# Patient Record
Sex: Female | Born: 1960
Health system: Southern US, Community
[De-identification: ages and names within clinical notes are randomized; demographics above are authoritative.]

## PROBLEM LIST (undated history)

## (undated) DIAGNOSIS — I428 Other cardiomyopathies: Secondary | ICD-10-CM

## (undated) DIAGNOSIS — E7800 Pure hypercholesterolemia, unspecified: Secondary | ICD-10-CM

## (undated) DIAGNOSIS — I5022 Chronic systolic (congestive) heart failure: Secondary | ICD-10-CM

## (undated) DIAGNOSIS — E119 Type 2 diabetes mellitus without complications: Secondary | ICD-10-CM

## (undated) DIAGNOSIS — D509 Iron deficiency anemia, unspecified: Secondary | ICD-10-CM

## (undated) DIAGNOSIS — J45909 Unspecified asthma, uncomplicated: Secondary | ICD-10-CM

## (undated) DIAGNOSIS — I10 Essential (primary) hypertension: Secondary | ICD-10-CM

## (undated) HISTORY — PX: TUBAL LIGATION: SHX77

## (undated) HISTORY — PX: WISDOM TOOTH EXTRACTION: SHX21

## (undated) HISTORY — PX: COLONOSCOPY: SHX174

---

## 1998-12-11 ENCOUNTER — Emergency Department (HOSPITAL_COMMUNITY): Admission: EM | Admit: 1998-12-11 | Discharge: 1998-12-11 | Payer: Self-pay | Admitting: Internal Medicine

## 2000-04-26 ENCOUNTER — Emergency Department (HOSPITAL_COMMUNITY): Admission: EM | Admit: 2000-04-26 | Discharge: 2000-04-26 | Payer: Self-pay | Admitting: Emergency Medicine

## 2000-04-26 ENCOUNTER — Encounter: Payer: Self-pay | Admitting: Emergency Medicine

## 2003-12-20 HISTORY — PX: CORONARY ANGIOPLASTY: SHX604

## 2004-03-07 ENCOUNTER — Emergency Department (HOSPITAL_COMMUNITY): Admission: AD | Admit: 2004-03-07 | Discharge: 2004-03-07 | Payer: Self-pay | Admitting: Family Medicine

## 2004-12-03 ENCOUNTER — Inpatient Hospital Stay (HOSPITAL_COMMUNITY): Admission: EM | Admit: 2004-12-03 | Discharge: 2004-12-07 | Payer: Self-pay | Admitting: Emergency Medicine

## 2004-12-03 ENCOUNTER — Encounter: Payer: Self-pay | Admitting: Cardiology

## 2005-05-05 ENCOUNTER — Encounter: Admission: RE | Admit: 2005-05-05 | Discharge: 2005-05-05 | Payer: Self-pay | Admitting: Internal Medicine

## 2006-09-28 ENCOUNTER — Encounter: Admission: RE | Admit: 2006-09-28 | Discharge: 2006-09-28 | Payer: Self-pay | Admitting: Internal Medicine

## 2007-10-31 ENCOUNTER — Encounter: Admission: RE | Admit: 2007-10-31 | Discharge: 2007-10-31 | Payer: Self-pay | Admitting: Internal Medicine

## 2008-05-01 ENCOUNTER — Emergency Department (HOSPITAL_COMMUNITY): Admission: EM | Admit: 2008-05-01 | Discharge: 2008-05-01 | Payer: Self-pay | Admitting: Emergency Medicine

## 2008-05-28 ENCOUNTER — Ambulatory Visit (HOSPITAL_COMMUNITY): Admission: RE | Admit: 2008-05-28 | Discharge: 2008-05-28 | Payer: Self-pay | Admitting: Cardiology

## 2008-05-28 ENCOUNTER — Encounter (INDEPENDENT_AMBULATORY_CARE_PROVIDER_SITE_OTHER): Payer: Self-pay | Admitting: Cardiology

## 2008-11-25 ENCOUNTER — Encounter: Admission: RE | Admit: 2008-11-25 | Discharge: 2008-11-25 | Payer: Self-pay | Admitting: Internal Medicine

## 2009-10-08 ENCOUNTER — Emergency Department (HOSPITAL_COMMUNITY): Admission: EM | Admit: 2009-10-08 | Discharge: 2009-10-08 | Payer: Self-pay | Admitting: Family Medicine

## 2011-05-06 NOTE — Cardiovascular Report (Signed)
[name], UCCELLO             ACCOUNT NO.:  [id number]   MEDICAL RECORD NO.:  [id number]          PATIENT TYPE:  INP   LOCATION:                               FACILITY:  [facility]   PHYSICIAN:  Jeanella Craze. Little, M.D. DATE OF BIRTH:  1961-02-05   DATE OF PROCEDURE:  12/06/2004  DATE OF DISCHARGE:                              CARDIAC CATHETERIZATION   INDICATIONS FOR TEST:  This 50 year old female has a family history of  cardiomyopathy and coronary artery disease.  She presents with a three to  four-week history of increasing PND and orthopnea and marked dyspnea on  exertion.  She was admitted on December 03, 2004 in mild congestive heart  failure.  She has cardiomegaly and a decreased ejection fraction.   She is brought to the catheterization laboratory for right and left heart  catheterization.   PROCEDURE:  Following informed consent the patient was prepped and draped in  the usual sterile fashion exposing the right groin.  Following local  anesthetic with 1% Xylocaine the Seldinger technique was employed and the 7-  [place] introducer sheath was placed into the right femoral vein, a 5-French  introducer sheath in the right femoral artery.  Left and right coronary  arteriography and ventriculography in the RAO projection and LAO projection  and a Swan-Ganz catheter was advanced through its normal route into the  pulmonary artery with hemodynamic monitoring undertaken throughout its  station.  Cardiac output by thermodilution was performed.  Oxygen  saturations in both the AO and pulmonary artery were performed.   TOTAL CONTRAST:  110 mL.   EQUIPMENT:  5-French Judkins diagnostic catheters, 7-French Swan-Ganz  catheter.   RESULTS:  HEMODYNAMIC MONITORING:  Right atrial pressure was 7.  Right ventricular was 46/6.  Pulmonary artery  pressure was 49/23.  Pulmonary artery wedge pressure 17.  Central aortic  pressure 140/95.  Left ventricular pressure 137/13.  The left  ventricular  end-diastolic pressure was 25.   Cardiac output by thermodilution 3.4 L/minute.  Cardiac index 2.1.  Cardiac  output by Fick 4.5 L/minute, cardiac index 2.7.   Oxygen saturation in the AO 92% on room air, in the pulmonary artery 66% on  room air.   VENTRICULOGRAPHY:  Ventriculography in both the RAO and LAO projection revealed mildly dilated  left ventricular cavity.  There was global hypokinesis.  Ejection fraction  calculated at 30%.  The left ventricular end-diastolic pressure was 25.  No  significant mitral regurgitation was seen on ventriculography.   CORONARY ARTERIOGRAPHY:  There was mild calcification seen on fluoroscopy in the distribution of the  mid LAD.  1.  Left main normal.  It bifurcated.  2.  LAD:  The LAD crossed the apex of the heart.  There were mild      irregularities in the proximal and mid segment all less than 30%.  There      were three diagonals that were relatively small and the distal LAD as it      crossed the apex was small.  3.  Circumflex:  Mild irregularities in the proximal circumflex were noted.  The distal circumflex as it bifurcated into OM #2 and OM #3 was      relatively small.  The ostium of the first marginal vessel was about 60%      narrowed.  OM #2 and OM #3 were small in diameter, but did not have      significant disease.  4.  Right coronary artery:  The right coronary artery has a proximal area of      30% narrowing and a mid area of 30% narrowing.  The PDA and the      posterolateral vessels were free of disease.   CONCLUSION:  1.  Pulmonary hypertension with pulmonary artery pressure of 49/23.  2.  Mild to moderate coronary disease, medical treatment only.  No high      grade stenoses.  3.  Left ventricular dilatation with global hypokinesis and a calculated      ejection fraction of 30%.  This is disproportionately low compared to      the mild coronary disease.  It appears to be an idiopathic       cardiomyopathy and will be best managed with medical therapy.   I have placed her on Lasix 40 mg b.i.d., lisinopril 10 mg daily (I stopped  the Altace because of the expense), increased her Coreg to 6.25 mg b.i.d.,  discontinued her Lovenox.  Will continue her on aspirin therapy.  She is  already on spironolactone.        ___________________________________________  Jeanella Craze Little, M.D.    ABL/MEDQ  D:  12/06/2004  T:  12/07/2004  Job:  [id number]   cc:   Nolene Ebbs, M.D.  [address]  [place]  [place] [address]  Fax: [phone]   Cath Lab

## 2011-05-06 NOTE — Discharge Summary (Signed)
NAMEFONDA, MONCRIEF             ACCOUNT NO.:  [id number]   MEDICAL RECORD NO.:  [id number]          PATIENT TYPE:  INP   LOCATION:  [id number]                         FACILITY:  [facility]   PHYSICIAN:  Nolene Ebbs, M.D.    DATE OF BIRTH:  19-Dec-1961   DATE OF ADMISSION:  12/02/2004  DATE OF DISCHARGE:  12/07/2004                                 DISCHARGE SUMMARY   PRESENTATION:  [name] is a 50 year old African American lady  who was admitted via the emergency room at [facility] where she  presented with one week of cough with frothy sputum, some chest discomfort  along with the cough. She had no leg swelling, diaphoresis, nausea, or  palpitations. She did have some orthopnea and paroxysmal nocturnal dyspnea.  In the emergency room she was noted to be in mild respiratory distress. She  had bibasilar crackles on her chest exam.   LABORATORY DATA:  She had an elevated brain natriuretic peptide. She was  therefore started on Aricept in case of new onset congestive heart failure  which is likely due to uncontrolled hypertension, chronic.   HOSPITAL COURSE:  On admission the patient was started on diuretics,  potassium replacement as her potassium was 3.3. She was monitored with I&Os,  daily weights. An echocardiogram was requested. Serial enzymes and EKG were  done to rule out acute coronary syndrome. The echocardiogram showed mild LV  dilatation, left decreased ventricular ejection fraction of 20% to 30%, and  marked left ventricular hypertrophy.  Cardiology consultation was requested  for possible cardiac catheterization to rule out ischemic cardiomyopathy.  The patient was seen by Dr. Tami Ribas at the [facility]. A left and right heart catheterization was performed on December 06, 2004, which did show global LV hypokinesia with mild dilatation, ejection  fraction was calculated at 30%. There was mild to moderate coronary artery  disease. I was  recommended for medical therapy and also pulmonary  hypertension at 49/33.  The patient was therefore started on lisinopril 10  mg once a day, Lasix changed to 40 mg b.i.d., Coreg started as 6.25 mg p.o.  b.i.d. The patient's condition improved during the course of hospitalization  and she had no __________, no chest pain, and the cough resolved. On  December 07, 2004, she was in good condition, vital signs were stable, chest  was clear to auscultation, and there was no peripheral edema. Potassium was  3.9, hemoglobin 13.2.  She was therefore thought to be stable for discharge  home.   DISCHARGE DIAGNOSES:  1.  Congestive heart failure due to idiopathic cardiomyopathy, ejection      fraction 30%, but hypertension was well controlled.  2.  Coronary artery disease, mild to moderate; for medical management.   The patient was discharged in stable condition.   DISPOSITION:  Home.   DISCHARGE MEDICATIONS:  1.  Lasix 40 mg one p.o. b.i.d.  2.  Coreg 6.25 mg one p.o. b.i.d.  3.  Lisinopril 10 mg one p.o. daily.  4.  Enteric-coated aspirin 325 mg p.o. daily.  5.  Digoxin 0.125 mg p.o.  daily.  6.  Inspra 25 mg p.o. daily.  7.  Potassium chloride 20 mEq p.o. daily.   FOLLOWUP:  Dr. Gwenlyn Found in one week and Dr. Tami Ribas in two weeks.      EA/MEDQ  D:  02/01/2005  T:  02/01/2005  Job:  [id number]

## 2013-03-08 ENCOUNTER — Encounter (HOSPITAL_COMMUNITY): Payer: Self-pay | Admitting: Emergency Medicine

## 2013-03-08 ENCOUNTER — Emergency Department (HOSPITAL_COMMUNITY)
Admission: EM | Admit: 2013-03-08 | Discharge: 2013-03-08 | Disposition: A | Discharge status: Home / Self Care | Payer: Medicaid Other | Attending: Emergency Medicine | Admitting: Emergency Medicine

## 2013-03-08 ENCOUNTER — Emergency Department (HOSPITAL_COMMUNITY): Payer: Medicaid Other

## 2013-03-08 DIAGNOSIS — R509 Fever, unspecified: Secondary | ICD-10-CM | POA: Insufficient documentation

## 2013-03-08 DIAGNOSIS — R05 Cough: Secondary | ICD-10-CM | POA: Insufficient documentation

## 2013-03-08 DIAGNOSIS — R000 Tachycardia, unspecified: Secondary | ICD-10-CM | POA: Insufficient documentation

## 2013-03-08 DIAGNOSIS — J3489 Other specified disorders of nose and nasal sinuses: Secondary | ICD-10-CM | POA: Insufficient documentation

## 2013-03-08 DIAGNOSIS — R059 Cough, unspecified: Secondary | ICD-10-CM | POA: Insufficient documentation

## 2013-03-08 DIAGNOSIS — J40 Bronchitis, not specified as acute or chronic: Secondary | ICD-10-CM

## 2013-03-08 DIAGNOSIS — R0602 Shortness of breath: Secondary | ICD-10-CM | POA: Insufficient documentation

## 2013-03-08 DIAGNOSIS — I10 Essential (primary) hypertension: Secondary | ICD-10-CM | POA: Insufficient documentation

## 2013-03-08 HISTORY — DX: Essential (primary) hypertension: I10

## 2013-03-08 HISTORY — DX: Pure hypercholesterolemia, unspecified: E78.00

## 2013-03-08 HISTORY — DX: Unspecified asthma, uncomplicated: J45.909

## 2013-03-08 LAB — URINALYSIS, ROUTINE W REFLEX MICROSCOPIC
Bilirubin Urine: NEGATIVE
Glucose, UA: NEGATIVE mg/dL
Hgb urine dipstick: NEGATIVE
Nitrite: NEGATIVE
Specific Gravity, Urine: 1.02 (ref 1.005–1.030)
pH: 5 (ref 5.0–8.0)

## 2013-03-08 LAB — CBC WITH DIFFERENTIAL/PLATELET
Basophils Absolute: 0.1 10*3/uL (ref 0.0–0.1)
Basophils Relative: 1 % (ref 0–1)
Eosinophils Absolute: 0.1 10*3/uL (ref 0.0–0.7)
MCH: 25.7 pg — ABNORMAL LOW (ref 26.0–34.0)
MCHC: 33.7 g/dL (ref 30.0–36.0)
Neutro Abs: 5.6 10*3/uL (ref 1.7–7.7)
Neutrophils Relative %: 73 % (ref 43–77)
Platelets: 427 10*3/uL — ABNORMAL HIGH (ref 150–400)

## 2013-03-08 LAB — COMPREHENSIVE METABOLIC PANEL
ALT: 9 U/L (ref 0–35)
AST: 13 U/L (ref 0–37)
Albumin: 3.3 g/dL — ABNORMAL LOW (ref 3.5–5.2)
Alkaline Phosphatase: 91 U/L (ref 39–117)
Potassium: 3.6 mEq/L (ref 3.5–5.1)
Sodium: 140 mEq/L (ref 135–145)
Total Protein: 7.2 g/dL (ref 6.0–8.3)

## 2013-03-08 LAB — URINE MICROSCOPIC-ADD ON

## 2013-03-08 LAB — POCT I-STAT TROPONIN I

## 2013-03-08 LAB — D-DIMER, QUANTITATIVE: D-Dimer, Quant: 0.35 ug/mL-FEU (ref 0.00–0.48)

## 2013-03-08 MED ORDER — ALBUTEROL SULFATE (5 MG/ML) 0.5% IN NEBU
5.0000 mg | INHALATION_SOLUTION | Freq: Once | RESPIRATORY_TRACT | Status: AC
  Administered 2013-03-08: 5 mg via RESPIRATORY_TRACT
  Filled 2013-03-08: qty 1

## 2013-03-08 MED ORDER — ALBUTEROL SULFATE HFA 108 (90 BASE) MCG/ACT IN AERS
2.0000 | INHALATION_SPRAY | RESPIRATORY_TRACT | Status: DC | PRN
  Administered 2013-03-08: 2 via RESPIRATORY_TRACT
  Filled 2013-03-08: qty 6.7

## 2013-03-08 MED ORDER — IPRATROPIUM BROMIDE 0.02 % IN SOLN
0.5000 mg | Freq: Once | RESPIRATORY_TRACT | Status: AC
  Administered 2013-03-08: 0.5 mg via RESPIRATORY_TRACT
  Filled 2013-03-08: qty 2.5

## 2013-03-08 MED ORDER — PREDNISONE 20 MG PO TABS: 40.0000 mg | ORAL_TABLET | Freq: Every day | ORAL | Status: DC

## 2013-03-08 MED ORDER — LISINOPRIL 20 MG PO TABS: 10.0000 mg | ORAL_TABLET | Freq: Every day | ORAL | Status: DC

## 2013-03-08 NOTE — ED Provider Notes (Addendum)
History     CSN: [id number]  Arrival date & time 03/08/13  [id number]   First MD Initiated Contact with Patient 03/08/13 0757      Chief Complaint  Patient presents with  . Chest Pain  . Shortness of Breath    (Consider location/radiation/quality/duration/timing/severity/associated sxs/prior treatment) Patient is a 52 y.o. female presenting with chest pain and shortness of breath. The history is provided by the patient.  Chest Pain Chest pain location: Entire chest. Pain quality: tightness   Pain radiates to:  Does not radiate Pain radiates to the back: no   Pain severity:  Moderate Onset quality:  Gradual Duration:  1 week Timing:  Constant Progression:  Worsening Chronicity:  New Context: breathing   Context comment:  Activity and coughing Relieved by:  Rest Worsened by:  Coughing, exertion and deep breathing (Lying down) Associated symptoms: cough, fever and shortness of breath   Associated symptoms: no abdominal pain, no back pain, no diaphoresis, no lower extremity edema, no near-syncope, not vomiting and no weakness   Associated symptoms comment:  For the last 3-4 days has had yellow and green mucous production with cough Risk factors: high cholesterol and hypertension   Risk factors: no birth control, no coronary artery disease, no prior DVT/PE, no smoking and no surgery   Shortness of Breath Associated symptoms: chest pain, cough and fever   Associated symptoms: no abdominal pain, no diaphoresis and no vomiting     No past medical history on file.  No past surgical history on file.  No family history on file.  History  Substance Use Topics  . Smoking status: Not on file  . Smokeless tobacco: Not on file  . Alcohol Use: Not on file    OB History   No data available      Review of Systems  Constitutional: Positive for fever and chills. Negative for diaphoresis.  HENT: Positive for congestion and rhinorrhea.   Respiratory: Positive for cough and shortness  of breath.   Cardiovascular: Positive for chest pain. Negative for near-syncope.  Gastrointestinal: Negative for vomiting and abdominal pain.  Musculoskeletal: Negative for back pain.  Neurological: Negative for weakness.  All other systems reviewed and are negative.    Allergies  Review of patient's allergies indicates no known allergies.  Home Medications   Current Outpatient Rx  Name  Route  Sig  Dispense  Refill  . acetaminophen (TYLENOL) 500 MG tablet   Oral   Take 1,000 mg by mouth every 6 (six) hours as needed for pain.         Marland Kitchen guaiFENesin (MUCINEX) 600 MG 12 hr tablet   Oral   Take 600 mg by mouth 2 (two) times daily as needed for congestion.         Marland Kitchen Phenylephrine-Pheniramine-DM (THERAFLU COLD & COUGH) 10-08-19 MG PACK   Oral   Take 2 tablets by mouth 2 (two) times daily as needed (for cold/flu symptoms).           There were no vitals taken for this visit.  Physical Exam  Nursing note and vitals reviewed. Constitutional: She is oriented to person, place, and time. She appears well-developed and well-nourished. No distress.  HENT:  Head: Normocephalic and atraumatic.  Mouth/Throat: Oropharynx is clear and moist.  Eyes: Conjunctivae and EOM are normal. Pupils are equal, round, and reactive to light.  Neck: Normal range of motion. Neck supple.  Cardiovascular: Regular rhythm and intact distal pulses.  Tachycardia present.   No murmur  heard. Pulmonary/Chest: Effort normal. No respiratory distress. She has decreased breath sounds in the right lower field and the left lower field. She has no wheezes. She has no rales.  Abdominal: Soft. She exhibits no distension. There is no tenderness. There is no rebound and no guarding.  Musculoskeletal: Normal range of motion. She exhibits no edema and no tenderness.  Neurological: She is alert and oriented to person, place, and time.  Skin: Skin is warm and dry. No rash noted. No erythema.  Psychiatric: She has a  normal mood and affect. Her behavior is normal.    ED Course  Procedures (including critical care time)  Labs Reviewed  CBC WITH DIFFERENTIAL - Abnormal; Notable for the following:    RBC 3.78 (*)    Hemoglobin 9.7 (*)    HCT 28.8 (*)    MCV 76.2 (*)    MCH 25.7 (*)    RDW 16.5 (*)    Platelets 427 (*)    All other components within normal limits  COMPREHENSIVE METABOLIC PANEL - Abnormal; Notable for the following:    Glucose, Bld 112 (*)    Albumin 3.3 (*)    All other components within normal limits  URINALYSIS, ROUTINE W REFLEX MICROSCOPIC - Abnormal; Notable for the following:    Color, Urine AMBER (*)    APPearance CLOUDY (*)    Protein, ur 30 (*)    All other components within normal limits  URINE MICROSCOPIC-ADD ON - Abnormal; Notable for the following:    Squamous Epithelial / LPF MANY (*)    Bacteria, UA FEW (*)    All other components within normal limits  D-DIMER, QUANTITATIVE  POCT I-STAT TROPONIN I   Dg Chest 2 View  03/08/2013  *RADIOLOGY REPORT*  Clinical Data: Cough and shortness of breath.  CHEST - 2 VIEW  Comparison: 12/03/2004.  Findings: The heart is borderline in size but stable.  The mediastinal and hilar contours are normal and unchanged. Bronchitic changes may suggest bronchitis.  No focal infiltrates, edema or effusions.  The bony thorax is intact.  IMPRESSION: Stable borderline heart size. Mild vascular congestion. Bronchitic lung changes may suggest bronchitis.   Original Report Authenticated By: Marijo Sanes, M.D.      Date: 03/08/2013  Rate: 104  Rhythm: sinus tachycardia  QRS Axis: normal  Intervals: normal  ST/T Wave abnormalities: nonspecific T wave changes  Conduction Disutrbances:none  Narrative Interpretation:   Old EKG Reviewed: none available   1. Bronchitis   2. Hypertension       MDM   Patient presenting with 2 weeks of cough, congestion and worsening shortness of breath. Patient is currently mildly tachycardic but  otherwise has normal vital signs.  She currently states she still feels mildly short of breath and having chest tightness. She does have a history of asthma but does not have an inhaler. Also she states she has a history of CHF, hypertension and hyperlipidemia and has been off medications for over a year and she's not seeing a regular Dr. Patient is well-appearing on exam and in no acute distress. Lungs are without wheezing but are diminished in the bases. Otherwise patient has no overt signs of heart failure.  She also has been complaining for the last month with intermittent leg pain currently is having no leg pain.  CBC, CMP, d-dimer, UA, chest x-ray pending. EKG shows sinus tachycardia in the low 100s with some nonspecific T-wave inversion anteriorly.  Patient given albuterol and Atrovent to see if she  has any symptomatic improvement.   10:52 AM Labs within normal limits. Chest x-ray showed bronchitis. D-dimer is negative. On repeat evaluation patient is feeling much better after albuterol and Atrovent. Will discharge home with an inhaler and steroids. Patient was also given a refill of her blood pressure medication and given information to get a new physician     Blanchie Dessert, MD 03/08/13 [place], MD 03/08/13 1054

## 2013-03-08 NOTE — ED Notes (Signed)
Discharge instructions reviewed. Pt verbalized understanding.  

## 2013-03-08 NOTE — ED Notes (Signed)
Patient states started out with a cough x 2 weeks ago.  Patient states she has been having chest pain associated with cough x 2 weeks.   Patient complains of shortness of breath when coughing.  Productive cough with phlegm that is yellow in color.

## 2013-03-18 ENCOUNTER — Emergency Department (HOSPITAL_COMMUNITY): Payer: Medicaid Other

## 2013-03-18 ENCOUNTER — Encounter (HOSPITAL_COMMUNITY): Payer: Self-pay | Admitting: *Deleted

## 2013-03-18 ENCOUNTER — Emergency Department (HOSPITAL_COMMUNITY)
Admission: EM | Admit: 2013-03-18 | Discharge: 2013-03-18 | Disposition: A | Discharge status: Home / Self Care | Payer: Medicaid Other | Attending: Emergency Medicine | Admitting: Emergency Medicine

## 2013-03-18 DIAGNOSIS — Z79899 Other long term (current) drug therapy: Secondary | ICD-10-CM | POA: Insufficient documentation

## 2013-03-18 DIAGNOSIS — I509 Heart failure, unspecified: Secondary | ICD-10-CM | POA: Insufficient documentation

## 2013-03-18 DIAGNOSIS — E7800 Pure hypercholesterolemia, unspecified: Secondary | ICD-10-CM | POA: Insufficient documentation

## 2013-03-18 DIAGNOSIS — J189 Pneumonia, unspecified organism: Secondary | ICD-10-CM

## 2013-03-18 DIAGNOSIS — J45909 Unspecified asthma, uncomplicated: Secondary | ICD-10-CM | POA: Insufficient documentation

## 2013-03-18 DIAGNOSIS — D649 Anemia, unspecified: Secondary | ICD-10-CM | POA: Insufficient documentation

## 2013-03-18 DIAGNOSIS — I10 Essential (primary) hypertension: Secondary | ICD-10-CM | POA: Insufficient documentation

## 2013-03-18 LAB — CBC
HCT: 25.7 % — ABNORMAL LOW (ref 36.0–46.0)
MCHC: 33.1 g/dL (ref 30.0–36.0)
MCV: 76 fL — ABNORMAL LOW (ref 78.0–100.0)
RDW: 17.1 % — ABNORMAL HIGH (ref 11.5–15.5)

## 2013-03-18 LAB — BASIC METABOLIC PANEL
BUN: 11 mg/dL (ref 6–23)
Creatinine, Ser: 0.89 mg/dL (ref 0.50–1.10)
GFR calc Af Amer: 84 mL/min — ABNORMAL LOW (ref >90)
GFR calc non Af Amer: 73 mL/min — ABNORMAL LOW (ref >90)
Potassium: 3.6 mEq/L (ref 3.5–5.1)

## 2013-03-18 LAB — POCT I-STAT TROPONIN I: Troponin i, poc: 0.01 ng/mL (ref 0.00–0.08)

## 2013-03-18 MED ORDER — AZITHROMYCIN 250 MG PO TABS: 250.0000 mg | ORAL_TABLET | Freq: Every day | ORAL | Status: DC

## 2013-03-18 MED ORDER — IPRATROPIUM BROMIDE 0.02 % IN SOLN
0.5000 mg | Freq: Once | RESPIRATORY_TRACT | Status: AC
  Administered 2013-03-18: 0.5 mg via RESPIRATORY_TRACT
  Filled 2013-03-18: qty 2.5

## 2013-03-18 MED ORDER — ALBUTEROL SULFATE (5 MG/ML) 0.5% IN NEBU
5.0000 mg | INHALATION_SOLUTION | Freq: Once | RESPIRATORY_TRACT | Status: AC
  Administered 2013-03-18: 5 mg via RESPIRATORY_TRACT
  Filled 2013-03-18: qty 1

## 2013-03-18 MED ORDER — DEXTROSE 5 % IV SOLN
1.0000 g | Freq: Once | INTRAVENOUS | Status: AC
  Administered 2013-03-18: 1 g via INTRAVENOUS
  Filled 2013-03-18: qty 10

## 2013-03-18 MED ORDER — IOHEXOL 350 MG/ML SOLN
100.0000 mL | Freq: Once | INTRAVENOUS | Status: AC | PRN
  Administered 2013-03-18: 100 mL via INTRAVENOUS

## 2013-03-18 NOTE — ED Notes (Signed)
Pt c/o productive cough, SOB, N/V, HA, fever, chills and generalized body aches.

## 2013-03-18 NOTE — ED Provider Notes (Signed)
History     CSN: [id number]  Arrival date & time 03/18/13  0704   First MD Initiated Contact with Patient 03/18/13 [phone]      Chief Complaint  Patient presents with  . URI    (Consider location/radiation/quality/duration/timing/severity/associated sxs/prior treatment) HPI Comments: Patient presents today with a chief complaint of productive cough, SOB, fever, chills, body aches, and chest tightness.  She reports that her symptoms have been present for the past 10 days.  Tightness in her chest has been constant over the past four days.  She denies chest pain, except for with coughing and taking a deep breath.  She had a fever yesterday, but cannot remember what her temperature was.  She was seen in the ED for the same one week ago and was diagnosed with Bronchitis.  She was discharged home with an Albuterol inhaler and given a 5 day course of Prednisone.  She reports that she completed the Prednisone.  She reports that her Albuterol inhaler does help with her symptoms.  She has a history of HTN, CHF, and Asthma.  She currently does not smoke and has never smoked.  She has never been hospitalized for Asthma.  She denies any prolonged travel or surgeries in the past 4 weeks.  No lower extremity edema, erythema, or pain.  No history of DVT or PE.  No use of estrogen containing medications.  Patient states that she has a history of CHF that was diagnosed in 2005.  Review of the chart shows that she had an Echo done in 2009, which showed a EF of 55%.  Patient is a 52 y.o. female presenting with URI. The history is provided by the patient.  URI Presenting symptoms: congestion, cough, fever and rhinorrhea   Associated symptoms: headaches and myalgias   Associated symptoms: no wheezing     Past Medical History  Diagnosis Date  . Hypertension   . Hypercholesterolemia   . CHF (congestive heart failure)   . Asthma     No past surgical history on file.  No family history on file.  History   Substance Use Topics  . Smoking status: Never Smoker   . Smokeless tobacco: Not on file  . Alcohol Use: No    OB History   Grav Para Term Preterm Abortions TAB SAB Ect Mult Living                  Review of Systems  Constitutional: Positive for fever and chills.  HENT: Positive for congestion and rhinorrhea.   Respiratory: Positive for cough, chest tightness and shortness of breath. Negative for wheezing.        Denies orthopnea or PND  Cardiovascular: Negative for leg swelling.  Gastrointestinal: Positive for nausea. Negative for vomiting, abdominal pain, blood in stool, abdominal distention and anal bleeding.       No melena or hematochezia  Musculoskeletal: Positive for myalgias.  Neurological: Positive for headaches. Negative for dizziness, syncope, light-headedness and numbness.  All other systems reviewed and are negative.    Allergies  Review of patient's allergies indicates no known allergies.  Home Medications   Current Outpatient Rx  Name  Route  Sig  Dispense  Refill  . acetaminophen (TYLENOL) 500 MG tablet   Oral   Take 1,000 mg by mouth every 6 (six) hours as needed for pain.         Marland Kitchen guaiFENesin (MUCINEX) 600 MG 12 hr tablet   Oral   Take 600 mg by mouth  2 (two) times daily as needed for congestion.         Marland Kitchen lisinopril (PRINIVIL,ZESTRIL) 20 MG tablet   Oral   Take 10 mg by mouth daily.         Marland Kitchen Phenylephrine-Pheniramine-DM (THERAFLU COLD & COUGH) 10-08-19 MG PACK   Oral   Take 2 tablets by mouth 2 (two) times daily as needed (for cold/flu symptoms).         . predniSONE (DELTASONE) 20 MG tablet   Oral   Take 2 tablets (40 mg total) by mouth daily.   10 tablet   0     BP 193/99  Pulse 112  Temp(Src) 98.8 F (37.1 C) (Oral)  Resp 20  SpO2 100%  LMP 03/05/2013  Physical Exam  Nursing note and vitals reviewed. Constitutional: She appears well-developed and well-nourished. No distress.  HENT:  Head: Normocephalic and  atraumatic.  Right Ear: Tympanic membrane and ear canal normal.  Left Ear: Tympanic membrane and ear canal normal.  Nose: Nose normal.  Mouth/Throat: Uvula is midline, oropharynx is clear and moist and mucous membranes are normal.  Neck: Normal range of motion. Neck supple.  Cardiovascular: Regular rhythm, normal heart sounds and intact distal pulses.  Tachycardia present.   Pulmonary/Chest: Effort normal and breath sounds normal. No respiratory distress. She has no wheezes. She has no rales.  Abdominal: Soft. There is no tenderness.  Musculoskeletal: Normal range of motion.  Trace pitting edema bilaterally No LE erythema Negative Homan's sign bilaterally  Neurological: She is alert.  Skin: Skin is warm and dry. She is not diaphoretic.  Psychiatric: She has a normal mood and affect.    ED Course  Procedures (including critical care time)  Labs Reviewed  CBC  BASIC METABOLIC PANEL  PRO B NATRIURETIC PEPTIDE  D-DIMER, QUANTITATIVE   Dg Chest 2 View  03/18/2013  *RADIOLOGY REPORT*  Clinical Data: Cough, shortness of breath and fever.  CHEST - 2 VIEW  Comparison: 03/08/2013  Findings: Bilateral bronchial thickening is slightly more prominent.  No focal consolidation, edema or pleural fluid is identified.  Heart size is stable.  IMPRESSION: Slightly more prominent bilateral bronchial thickening, likely reflecting bronchitis.   Original Report Authenticated By: Aletta Edouard, M.D.    Ct Angio Chest Pe W/cm &/or Wo Cm  03/18/2013  *RADIOLOGY REPORT*  Clinical Data: Productive cough.  Chest pain.  Shortness of breath.  CT ANGIOGRAPHY CHEST  Technique:  Multidetector CT imaging of the chest using the standard protocol during bolus administration of intravenous contrast. Multiplanar reconstructed images including MIPs were obtained and reviewed to evaluate the vascular anatomy.  Contrast: 124mL OMNIPAQUE IOHEXOL 350 MG/ML SOLN  Comparison: 03/18/2013  Findings: No filling defect is identified  in the pulmonary arterial tree to suggest pulmonary embolus.  Thoracic adenopathy identified.  Right upper paratracheal node short axis 0.9 cm, image 12 of series 4.  Lower right paratracheal node 1.3 cm, image 22 of series 4.  AP window node short axis 0.7 cm, image 27 of series 4.  Right hilar node 1.2 cm, image 34 of series 4.  Prominence of the infrahilar nodal tissue noted bilaterally.  Subcarinal node 1.5 cm on image 36 of series 4.  The nodes are relatively indistinctly marginated, with a suggestion of a low-level inflammatory stranding in the mediastinum.  Cardiomegaly noted with left ventricular predominance.  No reversal of the interventricular septal contour.  Small right pleural effusion noted.  Interstitial accentuation noted along secondary pulmonary lobular septate at the  lung apices.  Patchy ground-glass opacities are present in both lungs with the greatest degree of confluence in the right upper lobe where there is some overt airspace opacity. Airway thickening is present bilaterally.  IMPRESSION:  1.  Mild thoracic adenopathy with interstitial accentuation, scattered ground-glass opacities, considerable airway thickening, and cardiomegaly.  Configuration of the ground-glass opacities favors atypical pneumonia, with acute pulmonary edema or sarcoidosis less likely differential diagnostic considerations given the constellation of findings. 2.  No embolus observed.   Original Report Authenticated By: Van Clines, M.D.      No diagnosis found.  Reassessed patient.  She reports that she is feeling much better at this time.  MDM  Patient presenting with productive cough, chest tightness, and SOB.  No respiratory distress.  CXR showing bronchitis.  Patient found to be tachycardic initially.  D-dimer ordered and was found to be mildly elevated.    CT angio chest therefore ordered to rule out PE.  CTA negative for PE, but did show a possible Atypical Pneumonia.  Patient did have a fever  yesterday.  Patient given Ceftriaxone IV and discharged home with Azithromycin to treat for CAP.  Return precautions given.        Sherlyn Lees Bronson, PA-C 03/18/13 [phone]

## 2013-03-18 NOTE — ED Notes (Signed)
Pt alert and mentating appropriately upon d/c teaching; pt given d.c teaching and prescriptions; pt verbalizes understanding of d/c teaching; pt has no further questions upon d/c teaching; NAD noted upon d/c; pt given resource guide and education on usage.

## 2013-03-18 NOTE — ED Notes (Signed)
Per CT, patient's IV blew in CT.  They called IV team and they are getting another line at present.

## 2013-03-19 NOTE — ED Provider Notes (Signed)
Medical screening examination/treatment/procedure(s) were performed by non-physician practitioner and as supervising physician I was immediately available for consultation/collaboration.  Orlie Dakin, MD 03/19/13 [phone]

## 2013-04-08 ENCOUNTER — Emergency Department (HOSPITAL_COMMUNITY): Payer: Medicaid Other

## 2013-04-08 ENCOUNTER — Encounter: Payer: Self-pay | Admitting: Emergency Medicine

## 2013-04-08 ENCOUNTER — Emergency Department (HOSPITAL_COMMUNITY)
Admission: EM | Admit: 2013-04-08 | Discharge: 2013-04-08 | Disposition: A | Discharge status: Home / Self Care | Payer: Medicaid Other | Attending: Emergency Medicine | Admitting: Emergency Medicine

## 2013-04-08 ENCOUNTER — Encounter (HOSPITAL_COMMUNITY): Payer: Self-pay | Admitting: *Deleted

## 2013-04-08 DIAGNOSIS — I509 Heart failure, unspecified: Secondary | ICD-10-CM | POA: Insufficient documentation

## 2013-04-08 DIAGNOSIS — R0602 Shortness of breath: Secondary | ICD-10-CM | POA: Insufficient documentation

## 2013-04-08 DIAGNOSIS — R112 Nausea with vomiting, unspecified: Secondary | ICD-10-CM | POA: Insufficient documentation

## 2013-04-08 DIAGNOSIS — R109 Unspecified abdominal pain: Secondary | ICD-10-CM | POA: Insufficient documentation

## 2013-04-08 DIAGNOSIS — Z79899 Other long term (current) drug therapy: Secondary | ICD-10-CM | POA: Insufficient documentation

## 2013-04-08 DIAGNOSIS — I10 Essential (primary) hypertension: Secondary | ICD-10-CM | POA: Insufficient documentation

## 2013-04-08 DIAGNOSIS — J45909 Unspecified asthma, uncomplicated: Secondary | ICD-10-CM | POA: Insufficient documentation

## 2013-04-08 DIAGNOSIS — E876 Hypokalemia: Secondary | ICD-10-CM | POA: Insufficient documentation

## 2013-04-08 DIAGNOSIS — Z8639 Personal history of other endocrine, nutritional and metabolic disease: Secondary | ICD-10-CM | POA: Insufficient documentation

## 2013-04-08 DIAGNOSIS — Z8701 Personal history of pneumonia (recurrent): Secondary | ICD-10-CM | POA: Insufficient documentation

## 2013-04-08 DIAGNOSIS — Z862 Personal history of diseases of the blood and blood-forming organs and certain disorders involving the immune mechanism: Secondary | ICD-10-CM | POA: Insufficient documentation

## 2013-04-08 LAB — BASIC METABOLIC PANEL
CO2: 24 mEq/L (ref 19–32)
Calcium: 9.1 mg/dL (ref 8.4–10.5)
Chloride: 103 mEq/L (ref 96–112)
Glucose, Bld: 99 mg/dL (ref 70–99)
Potassium: 3 mEq/L — ABNORMAL LOW (ref 3.5–5.1)
Sodium: 138 mEq/L (ref 135–145)

## 2013-04-08 LAB — POCT I-STAT TROPONIN I: Troponin i, poc: 0.02 ng/mL (ref 0.00–0.08)

## 2013-04-08 LAB — CBC
HCT: 29.6 % — ABNORMAL LOW (ref 36.0–46.0)
Hemoglobin: 9.5 g/dL — ABNORMAL LOW (ref 12.0–15.0)
RBC: 3.94 MIL/uL (ref 3.87–5.11)
WBC: 5.9 10*3/uL (ref 4.0–10.5)

## 2013-04-08 LAB — PRO B NATRIURETIC PEPTIDE: Pro B Natriuretic peptide (BNP): 4745 pg/mL — ABNORMAL HIGH (ref 0–125)

## 2013-04-08 MED ORDER — LISINOPRIL 10 MG PO TABS: 10.0000 mg | ORAL_TABLET | Freq: Every day | ORAL | Status: DC

## 2013-04-08 MED ORDER — FUROSEMIDE 20 MG PO TABS: 20.0000 mg | ORAL_TABLET | Freq: Two times a day (BID) | ORAL | Status: DC

## 2013-04-08 MED ORDER — POTASSIUM CHLORIDE ER 10 MEQ PO TBCR: 20.0000 meq | EXTENDED_RELEASE_TABLET | Freq: Two times a day (BID) | ORAL | Status: DC

## 2013-04-08 MED ORDER — ALBUTEROL SULFATE HFA 108 (90 BASE) MCG/ACT IN AERS: 2.0000 | INHALATION_SPRAY | RESPIRATORY_TRACT | Status: DC | PRN

## 2013-04-08 MED ORDER — POTASSIUM CHLORIDE CRYS ER 20 MEQ PO TBCR
40.0000 meq | EXTENDED_RELEASE_TABLET | Freq: Once | ORAL | Status: AC
  Administered 2013-04-08: 40 meq via ORAL
  Filled 2013-04-08: qty 2

## 2013-04-08 MED ORDER — PREDNISONE 20 MG PO TABS: ORAL_TABLET | ORAL | Status: DC

## 2013-04-08 NOTE — ED Notes (Signed)
Pt with hx of chf to ED c/o diffuse abd pain, sob esp on laying flat and chest tightness.  States emesis this am.  States has been tx here for bronchitis/asthma several weeks ago with no relief.  No new edema noted.  States does not have a cardiologist.

## 2013-04-08 NOTE — ED Notes (Signed)
Medications and discharge instructions reviewed. Pt verbalized understanding.

## 2013-04-08 NOTE — ED Provider Notes (Signed)
History     CSN: [id number]  Arrival date & time 04/08/13  1638   First MD Initiated Contact with Patient 04/08/13 2040      Chief Complaint  Patient presents with  . Chest Pain  . Shortness of Breath  . Abdominal Pain    (Consider location/radiation/quality/duration/timing/severity/associated sxs/prior treatment) HPI Comments: This is a 52 year old female who presents today with shortness of breath and chest tightness. She has a history of congestive heart failure and hypertension. She does not take any home medications. She states she lost her blood pressure pills. She was on Lasix years ago. Currently she states that her shortness of breath has been worsening for the past month. She was seen in the ED about 3 weeks ago and treated with azithromycin for her an atypical pneumonia which she states that she took the full course. She reports no improvement since that time. She states she is here today because of her orthopnea. She cannot sleep because she cannot lay down or else her shortness of breath will worsen. She had an episode of nausea and vomiting this morning. She states those symptoms have now resolved. She denies chest pain, fevers, chills, weakness, numbness. The history is provided by the patient. No language interpreter was used.    Past Medical History  Diagnosis Date  . Hypertension   . Hypercholesterolemia   . CHF (congestive heart failure)   . Asthma     History reviewed. No pertinent past surgical history.  No family history on file.  History  Substance Use Topics  . Smoking status: Never Smoker   . Smokeless tobacco: Not on file  . Alcohol Use: No    OB History   Grav Para Term Preterm Abortions TAB SAB Ect Mult Living                  Review of Systems  Constitutional: Negative for fever and chills.  Respiratory: Positive for chest tightness and shortness of breath.   Cardiovascular: Negative for chest pain.  Gastrointestinal: Positive for nausea  and vomiting. Negative for diarrhea and constipation.  Neurological: Negative for weakness and numbness.  All other systems reviewed and are negative.    Allergies  Review of patient's allergies indicates no known allergies.  Home Medications   Current Outpatient Rx  Name  Route  Sig  Dispense  Refill  . acetaminophen (TYLENOL) 500 MG tablet   Oral   Take 1,000 mg by mouth every 6 (six) hours as needed for pain.         Marland Kitchen azithromycin (ZITHROMAX) 250 MG tablet   Oral   Take 1 tablet (250 mg total) by mouth daily. Take first 2 tablets together, then 1 every day until finished.   6 tablet   0   . guaiFENesin (MUCINEX) 600 MG 12 hr tablet   Oral   Take 600 mg by mouth 2 (two) times daily as needed for congestion.         Marland Kitchen lisinopril (PRINIVIL,ZESTRIL) 20 MG tablet   Oral   Take 10 mg by mouth daily.         Marland Kitchen Phenylephrine-Pheniramine-DM (THERAFLU COLD & COUGH) 10-08-19 MG PACK   Oral   Take 2 tablets by mouth 2 (two) times daily as needed (for cold/flu symptoms).         . predniSONE (DELTASONE) 20 MG tablet   Oral   Take 2 tablets (40 mg total) by mouth daily.   10 tablet  0     BP 151/102  Pulse 96  Temp(Src) 98.2 F (36.8 C) (Oral)  Resp 16  SpO2 99%  LMP 03/31/2013  Physical Exam  Nursing note and vitals reviewed. Constitutional: She is oriented to person, place, and time. She appears well-developed and well-nourished. She appears ill. No distress.  HENT:  Head: Normocephalic and atraumatic.  Right Ear: External ear normal.  Left Ear: External ear normal.  Nose: Nose normal.  Mouth/Throat: Oropharynx is clear and moist.  Eyes: Conjunctivae are normal.  Neck: Normal range of motion.  Cardiovascular: Normal rate, regular rhythm and normal heart sounds.   Pulmonary/Chest: Effort normal and breath sounds normal. No stridor. Not tachypneic. No respiratory distress. She has no wheezes. She has no rales.  No tachypnea, no increased work of  breathing Speaks in full sentences  Abdominal: Soft. She exhibits no distension.  Musculoskeletal: Normal range of motion.  Neurological: She is alert and oriented to person, place, and time. She has normal strength.  Skin: Skin is warm and dry. She is not diaphoretic. No erythema.  Psychiatric: She has a normal mood and affect. Her behavior is normal.    ED Course  Procedures (including critical care time)  Labs Reviewed  CBC - Abnormal; Notable for the following:    Hemoglobin 9.5 (*)    HCT 29.6 (*)    MCV 75.1 (*)    MCH 24.1 (*)    RDW 16.9 (*)    Platelets 469 (*)    All other components within normal limits  PRO B NATRIURETIC PEPTIDE - Abnormal; Notable for the following:    Pro B Natriuretic peptide (BNP) 4745.0 (*)    All other components within normal limits  BASIC METABOLIC PANEL - Abnormal; Notable for the following:    Potassium 3.0 (*)    GFR calc non Af Amer 69 (*)    GFR calc Af Amer 80 (*)    All other components within normal limits  POCT I-STAT TROPONIN I   Dg Chest 2 View  04/08/2013  *RADIOLOGY REPORT*  Clinical Data: Chest pain.  Shortness of breath.  Asthma.  CHEST - 2 VIEW  Comparison: 03/18/2013  Findings: Mild cardiomegaly stable.  Bilateral perihilar interstitial prominence is again demonstrated.  There is mild patchy airspace disease in the right lung which shows mild worsening in the right upper lobe.  A tiny right pleural effusion is also seen which is likely increased.  IMPRESSION:  1.  Mild bilateral interstitial and patchy airspace disease, showing mild worsening in the right upper lobe. 2.  Increased size of tiny right pleural effusion. 3.  Stable cardiomegaly.   Original Report Authenticated By: Earle Gell, M.D.     Date: 04/09/2013  Rate: 95  Rhythm: normal sinus rhythm  QRS Axis: normal  Intervals: normal  ST/T Wave abnormalities: normal  Conduction Disutrbances:none  Narrative Interpretation:   Old EKG Reviewed: unchanged    1.  Congestive heart failure   2. Hypokalemia       MDM  Patient presents today with hypokalemia and an exacerbation of CHF. She is noncompliant with her home medications including Lasix and lisinopril. BNP 4745 today. Potassium given in ED. Prescription given for Lasix, potassium, lisinopril, prednisone, albuterol. Discussed that she must followup with her PCP. She was given a resource guide to help establish care. Discussed case with Dr. Betsey Holiday and he agrees with plan. Strict return precautions given. Vital signs stable for discharge.Patient / Family / Caregiver informed of clinical course,  understand medical decision-making process, and agree with plan.         Elwyn Lade, PA-C 04/09/13 1035

## 2013-04-08 NOTE — ED Notes (Signed)
Hannah PA at bedside

## 2013-04-12 NOTE — ED Provider Notes (Signed)
Medical screening examination/treatment/procedure(s) were performed by non-physician practitioner and as supervising physician I was immediately available for consultation/collaboration.    Orpah Greek, MD 04/12/13 1450

## 2013-04-17 ENCOUNTER — Encounter (HOSPITAL_COMMUNITY): Payer: Self-pay | Admitting: *Deleted

## 2013-04-17 ENCOUNTER — Inpatient Hospital Stay (HOSPITAL_COMMUNITY): Payer: Medicaid Other

## 2013-04-17 ENCOUNTER — Emergency Department (HOSPITAL_COMMUNITY): Payer: Medicaid Other

## 2013-04-17 ENCOUNTER — Inpatient Hospital Stay (HOSPITAL_COMMUNITY)
Admission: EM | Admit: 2013-04-17 | Discharge: 2013-04-19 | DRG: 292 | Disposition: A | Discharge status: Home / Self Care | Payer: Medicaid Other | Attending: Cardiovascular Disease | Admitting: Cardiovascular Disease

## 2013-04-17 DIAGNOSIS — IMO0002 Reserved for concepts with insufficient information to code with codable children: Secondary | ICD-10-CM

## 2013-04-17 DIAGNOSIS — I5023 Acute on chronic systolic (congestive) heart failure: Secondary | ICD-10-CM

## 2013-04-17 DIAGNOSIS — I2510 Atherosclerotic heart disease of native coronary artery without angina pectoris: Secondary | ICD-10-CM | POA: Diagnosis present

## 2013-04-17 DIAGNOSIS — J45909 Unspecified asthma, uncomplicated: Secondary | ICD-10-CM | POA: Diagnosis present

## 2013-04-17 DIAGNOSIS — E876 Hypokalemia: Secondary | ICD-10-CM | POA: Diagnosis present

## 2013-04-17 DIAGNOSIS — I509 Heart failure, unspecified: Secondary | ICD-10-CM | POA: Diagnosis present

## 2013-04-17 DIAGNOSIS — E7800 Pure hypercholesterolemia, unspecified: Secondary | ICD-10-CM | POA: Diagnosis present

## 2013-04-17 DIAGNOSIS — D509 Iron deficiency anemia, unspecified: Secondary | ICD-10-CM | POA: Diagnosis present

## 2013-04-17 DIAGNOSIS — I214 Non-ST elevation (NSTEMI) myocardial infarction: Secondary | ICD-10-CM

## 2013-04-17 DIAGNOSIS — Z7982 Long term (current) use of aspirin: Secondary | ICD-10-CM

## 2013-04-17 DIAGNOSIS — I428 Other cardiomyopathies: Secondary | ICD-10-CM | POA: Diagnosis present

## 2013-04-17 DIAGNOSIS — R748 Abnormal levels of other serum enzymes: Secondary | ICD-10-CM | POA: Diagnosis present

## 2013-04-17 DIAGNOSIS — R778 Other specified abnormalities of plasma proteins: Secondary | ICD-10-CM

## 2013-04-17 DIAGNOSIS — I10 Essential (primary) hypertension: Secondary | ICD-10-CM | POA: Diagnosis present

## 2013-04-17 HISTORY — DX: Iron deficiency anemia, unspecified: D50.9

## 2013-04-17 HISTORY — DX: Chronic systolic (congestive) heart failure: I50.22

## 2013-04-17 HISTORY — DX: Other cardiomyopathies: I42.8

## 2013-04-17 LAB — CBC WITH DIFFERENTIAL/PLATELET
Basophils Relative: 1 % (ref 0–1)
Eosinophils Absolute: 0 10*3/uL (ref 0.0–0.7)
Eosinophils Relative: 0 % (ref 0–5)
Lymphs Abs: 2 10*3/uL (ref 0.7–4.0)
MCH: 23.6 pg — ABNORMAL LOW (ref 26.0–34.0)
MCHC: 31.9 g/dL (ref 30.0–36.0)
MCV: 74 fL — ABNORMAL LOW (ref 78.0–100.0)
Neutrophils Relative %: 79 % — ABNORMAL HIGH (ref 43–77)
Platelets: 582 10*3/uL — ABNORMAL HIGH (ref 150–400)
RBC: 4.07 MIL/uL (ref 3.87–5.11)

## 2013-04-17 LAB — LIPID PANEL
Cholesterol: 204 mg/dL — ABNORMAL HIGH (ref 0–200)
HDL: 37 mg/dL — ABNORMAL LOW (ref >39)
Triglycerides: 114 mg/dL (ref <150)

## 2013-04-17 LAB — HEMOGLOBIN A1C: Mean Plasma Glucose: 128 mg/dL — ABNORMAL HIGH (ref <117)

## 2013-04-17 LAB — COMPREHENSIVE METABOLIC PANEL
ALT: 38 U/L — ABNORMAL HIGH (ref 0–35)
Albumin: 3.3 g/dL — ABNORMAL LOW (ref 3.5–5.2)
BUN: 17 mg/dL (ref 6–23)
Calcium: 9 mg/dL (ref 8.4–10.5)
GFR calc Af Amer: 78 mL/min — ABNORMAL LOW (ref >90)
Glucose, Bld: 104 mg/dL — ABNORMAL HIGH (ref 70–99)
Sodium: 139 mEq/L (ref 135–145)
Total Protein: 7 g/dL (ref 6.0–8.3)

## 2013-04-17 LAB — TROPONIN I
Troponin I: 0.82 ng/mL (ref <0.30)
Troponin I: 0.9 ng/mL (ref <0.30)

## 2013-04-17 LAB — MRSA PCR SCREENING: MRSA by PCR: NEGATIVE

## 2013-04-17 LAB — TSH: TSH: 2.54 u[IU]/mL (ref 0.350–4.500)

## 2013-04-17 LAB — POCT I-STAT TROPONIN I: Troponin i, poc: 0.25 ng/mL (ref 0.00–0.08)

## 2013-04-17 LAB — LIPASE, BLOOD: Lipase: 20 U/L (ref 11–59)

## 2013-04-17 MED ORDER — ATORVASTATIN CALCIUM 20 MG PO TABS
20.0000 mg | ORAL_TABLET | Freq: Every day | ORAL | Status: DC
  Administered 2013-04-17 – 2013-04-18 (×2): 20 mg via ORAL
  Filled 2013-04-17 (×3): qty 1

## 2013-04-17 MED ORDER — HEPARIN BOLUS VIA INFUSION
4000.0000 [IU] | Freq: Once | INTRAVENOUS | Status: AC
  Administered 2013-04-17: 4000 [IU] via INTRAVENOUS

## 2013-04-17 MED ORDER — SIMVASTATIN 40 MG PO TABS: 40.0000 mg | ORAL_TABLET | Freq: Every day | ORAL | Status: DC

## 2013-04-17 MED ORDER — HEPARIN (PORCINE) IN NACL 100-0.45 UNIT/ML-% IJ SOLN
900.0000 [IU]/h | INTRAMUSCULAR | Status: DC
  Administered 2013-04-17 (×2): 900 [IU]/h via INTRAVENOUS
  Filled 2013-04-17 (×2): qty 250

## 2013-04-17 MED ORDER — FUROSEMIDE 10 MG/ML IJ SOLN
40.0000 mg | Freq: Once | INTRAMUSCULAR | Status: AC
  Administered 2013-04-17: 40 mg via INTRAVENOUS
  Filled 2013-04-17: qty 4

## 2013-04-17 MED ORDER — NITROGLYCERIN 0.4 MG SL SUBL: 0.4000 mg | SUBLINGUAL_TABLET | SUBLINGUAL | Status: DC | PRN

## 2013-04-17 MED ORDER — FERROUS SULFATE 325 (65 FE) MG PO TABS
325.0000 mg | ORAL_TABLET | Freq: Every day | ORAL | Status: DC
  Administered 2013-04-18 – 2013-04-19 (×2): 325 mg via ORAL
  Filled 2013-04-17 (×3): qty 1

## 2013-04-17 MED ORDER — ONDANSETRON HCL 4 MG/2ML IJ SOLN: 4.0000 mg | Freq: Four times a day (QID) | INTRAMUSCULAR | Status: DC | PRN

## 2013-04-17 MED ORDER — ASPIRIN 81 MG PO CHEW
324.0000 mg | CHEWABLE_TABLET | Freq: Once | ORAL | Status: AC
  Administered 2013-04-17: 324 mg via ORAL
  Filled 2013-04-17: qty 4

## 2013-04-17 MED ORDER — ALBUTEROL SULFATE HFA 108 (90 BASE) MCG/ACT IN AERS: 2.0000 | INHALATION_SPRAY | RESPIRATORY_TRACT | Status: DC | PRN

## 2013-04-17 MED ORDER — SODIUM CHLORIDE 0.9 % IV SOLN
INTRAVENOUS | Status: DC
  Administered 2013-04-17: 10 mL/h via INTRAVENOUS

## 2013-04-17 MED ORDER — ACETAMINOPHEN 325 MG PO TABS: 650.0000 mg | ORAL_TABLET | ORAL | Status: DC | PRN

## 2013-04-17 MED ORDER — ASPIRIN EC 81 MG PO TBEC
81.0000 mg | DELAYED_RELEASE_TABLET | Freq: Every day | ORAL | Status: DC
  Administered 2013-04-18 – 2013-04-19 (×2): 81 mg via ORAL
  Filled 2013-04-17 (×2): qty 1

## 2013-04-17 MED ORDER — AMLODIPINE BESYLATE 5 MG PO TABS
5.0000 mg | ORAL_TABLET | Freq: Every day | ORAL | Status: DC
  Administered 2013-04-17: 5 mg via ORAL
  Filled 2013-04-17 (×2): qty 1

## 2013-04-17 MED ORDER — LISINOPRIL 10 MG PO TABS
10.0000 mg | ORAL_TABLET | Freq: Every day | ORAL | Status: DC
Start: 2013-04-17 — End: 2013-04-19
  Administered 2013-04-17 – 2013-04-19 (×3): 10 mg via ORAL
  Filled 2013-04-17 (×3): qty 1

## 2013-04-17 NOTE — ED Notes (Signed)
Patient transported to X-ray 

## 2013-04-17 NOTE — ED Notes (Signed)
Cardiologist is aware of chest pain.

## 2013-04-17 NOTE — ED Notes (Signed)
i stat troponin results give to Dr. Sharol Given by Leonel Ramsay, EMT

## 2013-04-17 NOTE — ED Provider Notes (Signed)
History     CSN: [id number]  Arrival date & time 04/17/13  0047   First MD Initiated Contact with Patient 04/17/13 0117      Chief Complaint  Patient presents with  . multiple symptoms     (Consider location/radiation/quality/duration/timing/severity/associated sxs/prior treatment) HPI 52 yo female presents to the ER with several complaints.  She reports worsening shortness of breath, orthopnea, DOE, PND over the last 4-5 days.  She has had increased cough.  She developed mid back pain, worse with movement and palpation.  She denies any trauma, new activities or prior similar pain.  She reports diffuse abdominal pain, no fevers, no nausea or diarrhea.  She had one episode of post tussive emesis today.  She has had shooting pain into her legs bilaterally.  All symptoms have presented in last 4-5 days.  Pt was seen by her PCM, Dr Vista Lawman on Monday.  He started her on amlodipine.  Pt has h/o idiopathic cardiomyopathy, cad, pulm hypertension, asthma, and CHF.  She was told that she was anemic on her most recent visit, and started on otc iron as well.  She has had some chest pain with coughing.  Past Medical History  Diagnosis Date  . Hypertension   . Hypercholesterolemia   . CHF (congestive heart failure)   . Asthma     History reviewed. No pertinent past surgical history.  No family history on file.  History  Substance Use Topics  . Smoking status: Never Smoker   . Smokeless tobacco: Not on file  . Alcohol Use: No    OB History   Grav Para Term Preterm Abortions TAB SAB Ect Mult Living                  Review of Systems  All other systems reviewed and are negative.    Allergies  Review of patient's allergies indicates no known allergies.  Home Medications   Current Outpatient Rx  Name  Route  Sig  Dispense  Refill  . albuterol (PROVENTIL HFA;VENTOLIN HFA) 108 (90 BASE) MCG/ACT inhaler   Inhalation   Inhale 2 puffs into the lungs every 4 (four) hours as needed  for wheezing.   1 Inhaler   0   . amLODipine (NORVASC) 5 MG tablet   Oral   Take 5 mg by mouth daily.         . ferrous sulfate 325 (65 FE) MG tablet   Oral   Take 325 mg by mouth daily with breakfast.         . furosemide (LASIX) 20 MG tablet   Oral   Take 1 tablet (20 mg total) by mouth 2 (two) times daily.   30 tablet   0   . lisinopril (PRINIVIL,ZESTRIL) 10 MG tablet   Oral   Take 1 tablet (10 mg total) by mouth daily.   30 tablet   0   . potassium chloride (K-DUR) 10 MEQ tablet   Oral   Take 2 tablets (20 mEq total) by mouth 2 (two) times daily.   30 tablet   0     BP 153/92  Pulse 93  Temp(Src) 98.7 F (37.1 C) (Oral)  Resp 20  SpO2 100%  LMP 03/31/2013  Physical Exam  Nursing note and vitals reviewed. Constitutional: She is oriented to person, place, and time. She appears well-developed and well-nourished. No distress.  HENT:  Head: Normocephalic and atraumatic.  Nose: Nose normal.  Mouth/Throat: Oropharynx is clear and moist.  Eyes:  Conjunctivae and EOM are normal. Pupils are equal, round, and reactive to light.  Neck: Normal range of motion. Neck supple. No JVD present. No tracheal deviation present. No thyromegaly present.  Cardiovascular: Normal rate, regular rhythm, normal heart sounds and intact distal pulses.  Exam reveals no gallop and no friction rub.   No murmur heard. Pulmonary/Chest: Effort normal. No stridor. No respiratory distress. She has no wheezes. She has rales. She exhibits no tenderness.  tachypnea  Abdominal: Soft. Bowel sounds are normal. She exhibits no distension and no mass. There is tenderness (mild diffuse abd pain). There is no rebound and no guarding.  Musculoskeletal: Normal range of motion. She exhibits edema. She exhibits no tenderness.  Lymphadenopathy:    She has no cervical adenopathy.  Neurological: She is alert and oriented to person, place, and time. She exhibits normal muscle tone. Coordination normal.  Skin:  Skin is warm and dry. No rash noted. No erythema. No pallor.  Psychiatric: She has a normal mood and affect. Her behavior is normal. Judgment and thought content normal.    ED Course  Procedures (including critical care time)  CRITICAL CARE Performed by: Kalman Drape   Total critical care time: 30 min  Critical care time was exclusive of separately billable procedures and treating other patients.  Critical care was necessary to treat or prevent imminent or life-threatening deterioration.  Critical care was time spent personally by me on the following activities: development of treatment plan with patient and/or surrogate as well as nursing, discussions with consultants, evaluation of patient's response to treatment, examination of patient, obtaining history from patient or surrogate, ordering and performing treatments and interventions, ordering and review of laboratory studies, ordering and review of radiographic studies, pulse oximetry and re-evaluation of patient's condition.   Labs Reviewed  CBC WITH DIFFERENTIAL - Abnormal; Notable for the following:    WBC 12.1 (*)    Hemoglobin 9.6 (*)    HCT 30.1 (*)    MCV 74.0 (*)    MCH 23.6 (*)    RDW 17.3 (*)    Platelets 582 (*)    Neutrophils Relative 79 (*)    Neutro Abs 9.6 (*)    All other components within normal limits  COMPREHENSIVE METABOLIC PANEL - Abnormal; Notable for the following:    Glucose, Bld 104 (*)    Albumin 3.3 (*)    ALT 38 (*)    GFR calc non Af Amer 67 (*)    GFR calc Af Amer 78 (*)    All other components within normal limits  PRO B NATRIURETIC PEPTIDE - Abnormal; Notable for the following:    Pro B Natriuretic peptide (BNP) 7121.0 (*)    All other components within normal limits  POCT I-STAT TROPONIN I - Abnormal; Notable for the following:    Troponin i, poc 0.25 (*)    All other components within normal limits  LIPASE, BLOOD  TROPONIN I   Dg Chest 2 View  04/17/2013  *RADIOLOGY REPORT*   Clinical Data: Fever, cough, chest pain, shortness of breath, weakness, abdominal pain, and vomiting for 3 days.  CHEST - 2 VIEW  Comparison: 04/08/2013  Findings: Shallow inspiration.  Cardiac enlargement.  Mild pulmonary vascular congestion.  This is increasing since the previous study.  Small right pleural effusion with mild infiltration or atelectasis in the right lung base.  No pneumothorax.  IMPRESSION: Mild developing pulmonary vascular congestion with small right pleural effusion and infiltration or atelectasis in the right lung base.  Original Report Authenticated By: Lucienne Capers, M.D.     Date: 04/17/2013  Rate: 93  Rhythm: normal sinus rhythm  QRS Axis: normal  Intervals: normal  ST/T Wave abnormalities: normal  Conduction Disutrbances:none  Narrative Interpretation:   Old EKG Reviewed: unchanged    1. CHF exacerbation   2. Elevated troponin       MDM  51 yo female with multiple complaints.  On exam she appears to have worsening CHf with rales, tachypnea.  POC troponin elevated, no ST elevation on EKG.  BNP up from prior levels.  Will give iv lasix, recheck troponin.  Expect patient will need admission for diuresis and possible repeat echo.        Kalman Drape, MD 04/17/13 [phone]

## 2013-04-17 NOTE — Care Management Note (Signed)
    Page 1 of 1   04/17/2013     10:09:54 AM   CARE MANAGEMENT NOTE 04/17/2013  Patient:  LISAANNE, SOLIS   Account Number:  [id number]  Date Initiated:  04/17/2013  Documentation initiated by:  Elissa Hefty  Subjective/Objective Assessment:   adm w pos troponins and fld overload     Action/Plan:   lives alone   Anticipated DC Date:     Anticipated DC Plan:        DC Planning Services  CM consult      Choice offered to / List presented to:             Status of service:   Medicare Important Message given?   (If response is "NO", the following Medicare IM given date fields will be blank) Date Medicare IM given:   Date Additional Medicare IM given:    Discharge Disposition:    Per UR Regulation:  Reviewed for med. necessity/level of care/duration of stay  If discussed at [place] of Stay Meetings, dates discussed:    Comments:

## 2013-04-17 NOTE — ED Notes (Signed)
The pt returned from xray.  Iv placed lab just drawing labs unable to get blood from the iv site.  Family with the pt

## 2013-04-17 NOTE — Progress Notes (Signed)
ANTICOAGULATION CONSULT NOTE - Initial Consult  Pharmacy Consult for heparin Indication: chest pain/ACS  No Known Allergies  Patient Measurements: Height: 5\' 1"  (154.9 cm) Weight: 164 lb 14.5 oz (74.8 kg) IBW/kg (Calculated) : 47.8 Heparin Dosing Weight: 65kg  Vital Signs: Temp: 97.4 F (36.3 C) (04/30 0240) Temp src: Oral (04/30 0054) BP: 154/92 mmHg (04/30 0240) Pulse Rate: 95 (04/30 0240)  Labs:  Recent Labs  04/17/13 0225 04/17/13 0340  HGB 9.6*  --   HCT 30.1*  --   PLT 582*  --   CREATININE 0.95  --   TROPONINI  --  0.66*    Estimated Creatinine Clearance: 63.4 ml/min (by C-G formula based on Cr of 0.95).   Medical History: Past Medical History  Diagnosis Date  . Hypertension   . Hypercholesterolemia   . CHF (congestive heart failure)   . Asthma     Assessment: 52yo female c/o SOB and abdominal/back pain that worsened today, initial troponin mildly elevated, to begin heparin.  Goal of Therapy:  Heparin level 0.3-0.7 units/ml Monitor platelets by anticoagulation protocol: Yes   Plan:  Will give heparin 4000 units IV bolus x1 followed by gtt at 900 units/hr and monitor heparin levels and CBC.  Wynona Neat, PharmD, BCPS  04/17/2013,4:41 AM

## 2013-04-17 NOTE — ED Notes (Signed)
To x-ray

## 2013-04-17 NOTE — ED Notes (Signed)
The pt is here with some sob abd pain back pain for a long time worse today coughing earlier and she  Vomited from the sputum.  Alert hyperventilating skin warm and dry

## 2013-04-17 NOTE — Progress Notes (Signed)
ANTICOAGULATION CONSULT NOTE - Follow Up Consult  Pharmacy Consult for heparin Indication: chest pain/ACS  No Known Allergies  Patient Measurements: Height: 5\' 1"  (154.9 cm) Weight: 164 lb 14.5 oz (74.8 kg) IBW/kg (Calculated) : 47.8 Heparin Dosing Weight: 65kg  Vital Signs: Temp: 97.6 F (36.4 C) (04/30 1215) Temp src: Oral (04/30 1215) BP: 153/90 mmHg (04/30 1215) Pulse Rate: 99 (04/30 1215)  Labs:  Recent Labs  04/17/13 0225 04/17/13 0340 04/17/13 1122  HGB 9.6*  --   --   HCT 30.1*  --   --   PLT 582*  --   --   HEPARINUNFRC  --   --  0.44  CREATININE 0.95  --   --   TROPONINI  --  0.66*  --     Estimated Creatinine Clearance: 63.4 ml/min (by C-G formula based on Cr of 0.95).  Assessment: 52yo female c/o SOB and abdominal/back pain that worsened today, initial troponin mildly elevated. Initial heparin level at goal (0.4) no bleeding issues noted.  Goal of Therapy:  Heparin level 0.3-0.7 units/ml Monitor platelets by anticoagulation protocol: Yes   Plan: Continue heparin at  900 units/hr Recheck anti-Xa level to confirm in 6 hours then daily  Continue to monitor H&H and platelets  Erin Hearing PharmD., BCPS Clinical Pharmacist Pager [phone] 04/17/2013 1:14 PM

## 2013-04-17 NOTE — H&P (Signed)
Cardiology History and Physical  No PCP Per Patient  History of Present Illness (and review of medical records): [facility] is a 52 y.o. female who presents for evaluation of shortness of breath and chest pain.  She has known hx of idiopathic cardiomyopathy diagnosed back in 2005 where she underwent cardiac cath revealed non-obstructive disease with EF 30% and global hypokinesis.  She has started on appriopriate meds and had improvement in EF to 55% in 2009.  She has since been lost to follow up and now taking any medications.  She has been seen recently here in the ED in March and April prior to this presentation for simimlar symptoms.  She reports return of shortness of breath, dyspnea on exertion, PND, orthopnea, weakness and fatigue.  She has also had intermittent mid sternal chest tightness/pressure which can get to 8/10.  This pain can occur at rest, but at times worse with exertion.  She had mild improvement after recently being started back on a few medications including Lasix and CCB.  She however had acute worsening overnight of her symptoms and presented to ED for further evaluation.    Previous diagnostic testing for coronary artery disease includes: cardiac catheterization and echocardiogram. Previous history of cardiac disease includes Cardiomyopathy CHF Coronary Artery Disease. Coronary artery disease risk factors include: dyslipidemia, hypertension and sedentary lifestyle. Patient denies history of coronary angioplasty, coronary artery stent and previous M.I..  Review of Systems No fevers, chills, no wheezing.  Did have some vomiting. Further review of systems was otherwise negative other than stated in HPI.  There are no active problems to display for this patient.  Past Medical History  Diagnosis Date  . Hypertension   . Hypercholesterolemia   . CHF (congestive heart failure)   . Asthma     History reviewed. No pertinent past surgical history.   (Not in a hospital  admission) No Known Allergies  History  Substance Use Topics  . Smoking status: Never Smoker   . Smokeless tobacco: Not on file  . Alcohol Use: No    No family history on file.   Objective: Patient Vitals for the past 8 hrs:  BP Temp Temp src Pulse Resp SpO2 Height Weight  04/17/13 0446 152/87 mmHg - - 91 14 100 % - -  04/17/13 0409 - - - - - - 5\' 1"  (1.549 m) 74.8 kg (164 lb 14.5 oz)  04/17/13 0240 154/92 mmHg 97.4 F (36.3 C) - 95 22 100 % - -  04/17/13 0122 153/92 mmHg - - 93 20 100 % - -  04/17/13 0054 158/95 mmHg 98.7 F (37.1 C) Oral 100 25 98 % - -   General Appearance:    Alert, cooperative, no distress,  Head:    Normocephalic, without obvious abnormality, atraumatic  Eyes:     Anicteric sclerae  Neck:   Supple, mild JVD  Lungs:     Clear to auscultation bilaterally, respirations unlabored  Heart:    Regular rate and rhythm, S1 and S2 normal, no murmur  Abdomen:     Soft, non-tender, normoactive bowel sounds  Extremities:   Extremities normal, atraumatic, 1+ BLE edema  Pulses:   2+ and symmetric all extremities  Skin:   no rashes or lesions  Neurologic:   No focal deficits. AAO x3   Results for orders placed during the hospital encounter of 04/17/13 (from the past 48 hour(s))  CBC WITH DIFFERENTIAL     Status: Abnormal   Collection Time  04/17/13  2:25 AM      Result Value Range   WBC 12.1 (*) 4.0 - 10.5 K/uL   RBC 4.07  3.87 - 5.11 MIL/uL   Hemoglobin 9.6 (*) 12.0 - 15.0 g/dL   HCT 30.1 (*) 36.0 - 46.0 %   MCV 74.0 (*) 78.0 - 100.0 fL   MCH 23.6 (*) 26.0 - 34.0 pg   MCHC 31.9  30.0 - 36.0 g/dL   RDW 17.3 (*) 11.5 - 15.5 %   Platelets 582 (*) 150 - 400 K/uL   Neutrophils Relative 79 (*) 43 - 77 %   Neutro Abs 9.6 (*) 1.7 - 7.7 K/uL   Lymphocytes Relative 16  12 - 46 %   Lymphs Abs 2.0  0.7 - 4.0 K/uL   Monocytes Relative 4  3 - 12 %   Monocytes Absolute 0.5  0.1 - 1.0 K/uL   Eosinophils Relative 0  0 - 5 %   Eosinophils Absolute 0.0  0.0 - 0.7 K/uL    Basophils Relative 1  0 - 1 %   Basophils Absolute 0.1  0.0 - 0.1 K/uL  COMPREHENSIVE METABOLIC PANEL     Status: Abnormal   Collection Time    04/17/13  2:25 AM      Result Value Range   Sodium 139  135 - 145 mEq/L   Potassium 4.1  3.5 - 5.1 mEq/L   Chloride 105  96 - 112 mEq/L   CO2 21  19 - 32 mEq/L   Glucose, Bld 104 (*) 70 - 99 mg/dL   BUN 17  6 - 23 mg/dL   Creatinine, Ser 0.95  0.50 - 1.10 mg/dL   Calcium 9.0  8.4 - 10.5 mg/dL   Total Protein 7.0  6.0 - 8.3 g/dL   Albumin 3.3 (*) 3.5 - 5.2 g/dL   AST 35  0 - 37 U/L   ALT 38 (*) 0 - 35 U/L   Alkaline Phosphatase 115  39 - 117 U/L   Total Bilirubin 0.9  0.3 - 1.2 mg/dL   GFR calc non Af Amer 67 (*) >90 mL/min   GFR calc Af Amer 78 (*) >90 mL/min   Comment:            The eGFR has been calculated     using the CKD EPI equation.     This calculation has not been     validated in all clinical     situations.     eGFR's persistently     <90 mL/min signify     possible Chronic Kidney Disease.  LIPASE, BLOOD     Status: None   Collection Time    04/17/13  2:25 AM      Result Value Range   Lipase 20  11 - 59 U/L  PRO B NATRIURETIC PEPTIDE     Status: Abnormal   Collection Time    04/17/13  2:26 AM      Result Value Range   Pro B Natriuretic peptide (BNP) 7121.0 (*) 0 - 125 pg/mL  POCT I-STAT TROPONIN I     Status: Abnormal   Collection Time    04/17/13  2:57 AM      Result Value Range   Troponin i, poc 0.25 (*) 0.00 - 0.08 ng/mL   Comment NOTIFIED PHYSICIAN     Comment 3            Comment: Due to the release kinetics of cTnI,  a negative result within the first hours     of the onset of symptoms does not rule out     myocardial infarction with certainty.     If myocardial infarction is still suspected,     repeat the test at appropriate intervals.  TROPONIN I     Status: Abnormal   Collection Time    04/17/13  3:40 AM      Result Value Range   Troponin I 0.66 (*) <0.30 ng/mL   Comment:            Due to  the release kinetics of cTnI,     a negative result within the first hours     of the onset of symptoms does not rule out     myocardial infarction with certainty.     If myocardial infarction is still suspected,     repeat the test at appropriate intervals.     CRITICAL RESULT CALLED TO, READ BACK BY AND VERIFIED WITH:     D.FRIEDBURGER RN [phone] 04/17/13 E.GADDY   Dg Chest 2 View  04/17/2013  *RADIOLOGY REPORT*  Clinical Data: Fever, cough, chest pain, shortness of breath, weakness, abdominal pain, and vomiting for 3 days.  CHEST - 2 VIEW  Comparison: 04/08/2013  Findings: Shallow inspiration.  Cardiac enlargement.  Mild pulmonary vascular congestion.  This is increasing since the previous study.  Small right pleural effusion with mild infiltration or atelectasis in the right lung base.  No pneumothorax.  IMPRESSION: Mild developing pulmonary vascular congestion with small right pleural effusion and infiltration or atelectasis in the right lung base.   Original Report Authenticated By: Lucienne Capers, M.D.     ECG:  Sinus rhythm no acute ischemic changes  Echo: 2009 - Overall left ventricular systolic function was normal. Left ventricular ejection fraction was estimated to be 55 %. There were no left ventricular regional wall motion abnormalities. Left ventricular wall thickness was at the upper limits of normal. - There was very mild mitral annular calcification. Cath: 2005 VENTRICULOGRAPHY:  Ventriculography in both the RAO and LAO projection revealed mildly dilated  left ventricular cavity. There was global hypokinesis. Ejection fraction  calculated at 30%. The left ventricular end-diastolic pressure was 25. No  significant mitral regurgitation was seen on ventriculography.  CORONARY ARTERIOGRAPHY:  There was mild calcification seen on fluoroscopy in the distribution of the  mid LAD.  1. Left main normal. It bifurcated.  2. LAD: The LAD crossed the apex of the heart. There were  mild  irregularities in the proximal and mid segment all less than 30%. There  were three diagonals that were relatively small and the distal LAD as it  crossed the apex was small.  3. Circumflex: Mild irregularities in the proximal circumflex were noted.  The distal circumflex as it bifurcated into OM #2 and OM #3 was  relatively small. The ostium of the first marginal vessel was about 60%  narrowed. OM #2 and OM #3 were small in diameter, but did not have  significant disease.  4. Right coronary artery: The right coronary artery has a proximal area of  30% narrowing and a mid area of 30% narrowing. The PDA and the  posterolateral vessels were free of disease.   Assessment: 64 AAF with hx of idiopathic CMP presents with return of symptoms concerning for Acute on chronic systolic heart failure along with NSTEMI given progressively worsening chest pain and positive troponin.   Plan: 1. Cardiology Admission  2. Continuous  monitoring on Telemetry. 3. Repeat ekg on admit, prn chest pain or arrythmia 4. Trend cardiac biomarkers, check lipids, hgba1c, tsh 5. Medical management to include ASA,Heparin, Statin, NTG prn 6. Continue gentle diuresis with IV lasix, strict I/Os, daily weights, needs HF education. 7. TTE in am reassess LV function, wall motion abnormality. 8. May need further invasive ischemic evaluation to r/o obstructive disease.

## 2013-04-17 NOTE — ED Notes (Signed)
Report given to Angela

## 2013-04-17 NOTE — Progress Notes (Signed)
Patient ID: [facility], female   DOB: 05/10/1961, 52 y.o.   MRN: [id number]    Subjective:  Denies SSCP, palpitations or Dyspnea Previously seen by [facility] Dr Rex Kras  Objective:  Filed Vitals:   04/17/13 0446 04/17/13 0600 04/17/13 0700 04/17/13 0745  BP: 152/87 148/86 143/82 141/86  Pulse: 91 91 83 96  Temp:      TempSrc:      Resp: 14 26 26 16   Height:      Weight:      SpO2: 100% 100% 100% 100%    Intake/Output from previous day:  Intake/Output Summary (Last 24 hours) at 04/17/13 [id number] Last data filed at 04/17/13 0538  Gross per 24 hour  Intake      0 ml  Output   1300 ml  Net  -1300 ml    Physical Exam: Affect appropriate Healthy:  appears stated age HEENT: normal Neck supple with no adenopathy JVP normal no bruits no thyromegaly Lungs clear with no wheezing and good diaphragmatic motion Heart:  S1/S2 no murmur, no rub, gallop or click PMI normal Abdomen: benighn, BS positve, no tenderness, no AAA no bruit.  No HSM or HJR Distal pulses intact with no bruits No edema Neuro non-focal Skin warm and dry No muscular weakness   Lab Results: Basic Metabolic Panel:  Recent Labs  04/17/13 0225  NA 139  K 4.1  CL 105  CO2 21  GLUCOSE 104*  BUN 17  CREATININE 0.95  CALCIUM 9.0   Liver Function Tests:  Recent Labs  04/17/13 0225  AST 35  ALT 38*  ALKPHOS 115  BILITOT 0.9  PROT 7.0  ALBUMIN 3.3*    Recent Labs  04/17/13 0225  LIPASE 20   CBC:  Recent Labs  04/17/13 0225  WBC 12.1*  NEUTROABS 9.6*  HGB 9.6*  HCT 30.1*  MCV 74.0*  PLT 582*   Cardiac Enzymes:  Recent Labs  04/17/13 0340  TROPONINI 0.66*    Imaging: Dg Chest 2 View  04/17/2013  *RADIOLOGY REPORT*  Clinical Data: Fever, cough, chest pain, shortness of breath, weakness, abdominal pain, and vomiting for 3 days.  CHEST - 2 VIEW  Comparison: 04/08/2013  Findings: Shallow inspiration.  Cardiac enlargement.  Mild pulmonary vascular congestion.  This is increasing  since the previous study.  Small right pleural effusion with mild infiltration or atelectasis in the right lung base.  No pneumothorax.  IMPRESSION: Mild developing pulmonary vascular congestion with small right pleural effusion and infiltration or atelectasis in the right lung base.   Original Report Authenticated By: Lucienne Capers, M.D.     Cardiac Studies:  ECG: SR rate 88 LAD LVH   Telemetry: SR no arrhythmia 04/17/2013   Echo:   Medications:      . heparin 900 Units/hr (04/17/13 0534)    Assessment/Plan:  CHF:  With minimally elevated troponin History of non ischemic DCM.  Echo to assess EF  Good diuresis so far.  F/U CXR with lateral decubitus In am   HTN:  Rx with ACE Elevated Troponin:  No evidence of acute coronary syndrome no need for ETT unless echo abnormal  Jenkins Rouge 04/17/2013, 8:12 AM

## 2013-04-18 DIAGNOSIS — I369 Nonrheumatic tricuspid valve disorder, unspecified: Secondary | ICD-10-CM

## 2013-04-18 LAB — BASIC METABOLIC PANEL
CO2: 26 mEq/L (ref 19–32)
Chloride: 105 mEq/L (ref 96–112)
GFR calc Af Amer: 76 mL/min — ABNORMAL LOW (ref >90)
GFR calc non Af Amer: 66 mL/min — ABNORMAL LOW (ref >90)
GFR calc non Af Amer: 68 mL/min — ABNORMAL LOW (ref >90)
Glucose, Bld: 98 mg/dL (ref 70–99)
Potassium: 3 mEq/L — ABNORMAL LOW (ref 3.5–5.1)
Potassium: 3.1 mEq/L — ABNORMAL LOW (ref 3.5–5.1)
Sodium: 141 mEq/L (ref 135–145)
Sodium: 140 mEq/L (ref 135–145)

## 2013-04-18 LAB — PROTIME-INR
INR: 1.24 (ref 0.00–1.49)
Prothrombin Time: 15.4 seconds — ABNORMAL HIGH (ref 11.6–15.2)

## 2013-04-18 LAB — HEPARIN LEVEL (UNFRACTIONATED): Heparin Unfractionated: 0.29 IU/mL — ABNORMAL LOW (ref 0.30–0.70)

## 2013-04-18 LAB — CBC
Platelets: 543 10*3/uL — ABNORMAL HIGH (ref 150–400)
RDW: 17.5 % — ABNORMAL HIGH (ref 11.5–15.5)
WBC: 8.7 10*3/uL (ref 4.0–10.5)

## 2013-04-18 MED ORDER — POTASSIUM CHLORIDE CRYS ER 20 MEQ PO TBCR
40.0000 meq | EXTENDED_RELEASE_TABLET | Freq: Two times a day (BID) | ORAL | Status: DC
  Administered 2013-04-18 – 2013-04-19 (×3): 40 meq via ORAL
  Filled 2013-04-18 (×5): qty 2

## 2013-04-18 MED ORDER — FUROSEMIDE 20 MG PO TABS
20.0000 mg | ORAL_TABLET | Freq: Every day | ORAL | Status: DC
  Administered 2013-04-18 – 2013-04-19 (×2): 20 mg via ORAL
  Filled 2013-04-18 (×3): qty 1

## 2013-04-18 MED ORDER — POTASSIUM CHLORIDE CRYS ER 20 MEQ PO TBCR
40.0000 meq | EXTENDED_RELEASE_TABLET | Freq: Once | ORAL | Status: AC
  Administered 2013-04-18: 40 meq via ORAL

## 2013-04-18 MED ORDER — CARVEDILOL 3.125 MG PO TABS
3.1250 mg | ORAL_TABLET | Freq: Two times a day (BID) | ORAL | Status: DC
  Administered 2013-04-18 – 2013-04-19 (×2): 3.125 mg via ORAL
  Filled 2013-04-18 (×4): qty 1

## 2013-04-18 NOTE — Progress Notes (Signed)
Patient ID: [facility], female   DOB: 12-31-1960, 52 y.o.   MRN: [id number]    Subjective:  Denies SSCP, palpitations Dyspnea much improved  Objective:  Filed Vitals:   04/17/13 2200 04/18/13 0000 04/18/13 0400 04/18/13 0447  BP:  121/64 143/93   Pulse: 92 85 77   Temp:  98.6 F (37 C) 98.6 F (37 C)   TempSrc:  Oral Oral   Resp: 28 22 16    Height:      Weight:    160 lb 4.4 oz (72.7 kg)  SpO2: 100% 100% 100%     Intake/Output from previous day:  Intake/Output Summary (Last 24 hours) at 04/18/13 0758 Last data filed at 04/18/13 0600  Gross per 24 hour  Intake 1167.67 ml  Output   2000 ml  Net -832.33 ml    Physical Exam: Affect appropriate Obese black female HEENT: normal Neck supple with no adenopathy JVP normal no bruits no thyromegaly Lungs clear with no wheezing and good diaphragmatic motion Heart:  S1/S2 no murmur, no rub, gallop or click PMI normal Abdomen: benighn, BS positve, no tenderness, no AAA no bruit.  No HSM or HJR Distal pulses intact with no bruits No edema Neuro non-focal Skin warm and dry No muscular weakness   Lab Results: Basic Metabolic Panel:  Recent Labs  04/17/13 0225 04/18/13 0430  NA 139 141  K 4.1 3.0*  CL 105 104  CO2 21 26  GLUCOSE 104* 91  BUN 17 15  CREATININE 0.95 0.97  CALCIUM 9.0 8.4   Liver Function Tests:  Recent Labs  04/17/13 0225  AST 35  ALT 38*  ALKPHOS 115  BILITOT 0.9  PROT 7.0  ALBUMIN 3.3*    Recent Labs  04/17/13 0225  LIPASE 20   CBC:  Recent Labs  04/17/13 0225 04/18/13 0430  WBC 12.1* 8.7  NEUTROABS 9.6*  --   HGB 9.6* 9.4*  HCT 30.1* 29.2*  MCV 74.0* 73.6*  PLT 582* 543*   Cardiac Enzymes:  Recent Labs  04/17/13 1121 04/17/13 1542 04/17/13 2142  TROPONINI 0.82* 0.90* 0.78*  Hemoglobin A1C:  Recent Labs  04/17/13 1122  HGBA1C 6.1*   Fasting Lipid Panel:  Recent Labs  04/17/13 1121  CHOL 204*  HDL 37*  LDLCALC 144*  TRIG 114  CHOLHDL 5.5    Thyroid Function Tests:  Recent Labs  04/17/13 1122  TSH 2.540    Imaging: Dg Chest 2 View  04/17/2013  *RADIOLOGY REPORT*  Clinical Data: Fever, cough, chest pain, shortness of breath, weakness, abdominal pain, and vomiting for 3 days.  CHEST - 2 VIEW  Comparison: 04/08/2013  Findings: Shallow inspiration.  Cardiac enlargement.  Mild pulmonary vascular congestion.  This is increasing since the previous study.  Small right pleural effusion with mild infiltration or atelectasis in the right lung base.  No pneumothorax.  IMPRESSION: Mild developing pulmonary vascular congestion with small right pleural effusion and infiltration or atelectasis in the right lung base.   Original Report Authenticated By: Lucienne Capers, M.D.    Dg Chest Left Decubitus  04/17/2013  *RADIOLOGY REPORT*  Clinical Data: Chest pain.  CHEST - LEFT DECUBITUS  Comparison: 04/17/2013  Findings: Left side down decubitus view without  layering left- sided pleural effusion detected.  Pulmonary vascular congestion/mild pulmonary edema.  Cardiomegaly.  IMPRESSION: No layering left-sided pleural effusion.   Original Report Authenticated By: Genia Del, M.D.     Cardiac Studies:  Telemetry : NSR no VT or arrhythmia 04/18/2013  ECG :  SR rate 94 nonspecific ST/T wave changes  Echo: pending  Medications:   . amLODipine  5 mg Oral Daily  . aspirin EC  81 mg Oral Daily  . atorvastatin  20 mg Oral q1800  . ferrous sulfate  325 mg Oral Q breakfast  . lisinopril  10 mg Oral Daily     . sodium chloride 10 mL/hr (04/17/13 1826)  . heparin 900 Units/hr (04/17/13 2324)    Assessment/Plan:  CHF :  Echo pending Good diuresis Continue ACE  D/C amlodipine and add low dose beta blocker and oral diuretic Lungs clear Possible discharge in am Anemia: Low MCV iron studies guaic stools will need outpatient f/u and possible colonoscopy  Jenkins Rouge 04/18/2013, 7:58 AM

## 2013-04-18 NOTE — Progress Notes (Signed)
  Echocardiogram 2D Echocardiogram has been performed.  [name] 04/18/2013, 11:20 AM

## 2013-04-19 ENCOUNTER — Telehealth: Payer: Self-pay | Admitting: Cardiovascular Disease

## 2013-04-19 ENCOUNTER — Encounter (HOSPITAL_COMMUNITY): Payer: Self-pay | Admitting: Nurse Practitioner

## 2013-04-19 DIAGNOSIS — R778 Other specified abnormalities of plasma proteins: Secondary | ICD-10-CM

## 2013-04-19 DIAGNOSIS — E7800 Pure hypercholesterolemia, unspecified: Secondary | ICD-10-CM | POA: Diagnosis present

## 2013-04-19 DIAGNOSIS — I5023 Acute on chronic systolic (congestive) heart failure: Secondary | ICD-10-CM

## 2013-04-19 DIAGNOSIS — D509 Iron deficiency anemia, unspecified: Secondary | ICD-10-CM | POA: Diagnosis present

## 2013-04-19 DIAGNOSIS — I10 Essential (primary) hypertension: Secondary | ICD-10-CM | POA: Diagnosis present

## 2013-04-19 DIAGNOSIS — I428 Other cardiomyopathies: Secondary | ICD-10-CM | POA: Diagnosis present

## 2013-04-19 DIAGNOSIS — I5021 Acute systolic (congestive) heart failure: Secondary | ICD-10-CM | POA: Insufficient documentation

## 2013-04-19 LAB — CBC
HCT: 30.4 % — ABNORMAL LOW (ref 36.0–46.0)
Hemoglobin: 9.8 g/dL — ABNORMAL LOW (ref 12.0–15.0)
MCH: 23.8 pg — ABNORMAL LOW (ref 26.0–34.0)
MCV: 74 fL — ABNORMAL LOW (ref 78.0–100.0)
RBC: 4.11 MIL/uL (ref 3.87–5.11)

## 2013-04-19 MED ORDER — NITROGLYCERIN 0.4 MG SL SUBL: 0.4000 mg | SUBLINGUAL_TABLET | SUBLINGUAL | Status: DC | PRN

## 2013-04-19 MED ORDER — ATORVASTATIN CALCIUM 20 MG PO TABS: 20.0000 mg | ORAL_TABLET | Freq: Every day | ORAL | Status: DC

## 2013-04-19 MED ORDER — POTASSIUM CHLORIDE ER 20 MEQ PO TBCR: EXTENDED_RELEASE_TABLET | ORAL | Status: DC

## 2013-04-19 MED ORDER — FUROSEMIDE 20 MG PO TABS: 20.0000 mg | ORAL_TABLET | Freq: Every day | ORAL | Status: DC

## 2013-04-19 MED ORDER — ASPIRIN 81 MG PO TBEC: 81.0000 mg | DELAYED_RELEASE_TABLET | Freq: Every day | ORAL | Status: DC

## 2013-04-19 MED ORDER — CARVEDILOL 3.125 MG PO TABS: 3.1250 mg | ORAL_TABLET | Freq: Two times a day (BID) | ORAL | Status: DC

## 2013-04-19 NOTE — Telephone Encounter (Signed)
New Problem:    Patient is scheduled to have a 14 day TCM appointment with Dr. [name] Cancel on 04/30/13 at 10:30 a.m.

## 2013-04-19 NOTE — Progress Notes (Signed)
Dc instructions given to pt at this time re: f/u appts, diet, activity, meds, chf, s/s of problems, and my chart.  Pt verbalized understanding of all instructions. Denies pain.  No distress noted.

## 2013-04-19 NOTE — Progress Notes (Signed)
Patient ID: [facility], female   DOB: [birth year], 52 y.o.   MRN: [id number]    Subjective:  Denies SSCP, palpitations Dyspnea much improved  Objective:  Filed Vitals:   04/18/13 1300 04/18/13 1720 04/18/13 2026 04/19/13 0412  BP: 127/75 139/80 120/84 124/86  Pulse: 94  86 85  Temp: 97.3 F (36.3 C)  98.4 F (36.9 C) 98.6 F (37 C)  TempSrc: Oral  Oral Oral  Resp: 16  18 18   Height:      Weight:      SpO2: 100%  99% 100%    Intake/Output from previous day:  Intake/Output Summary (Last 24 hours) at 04/19/13 0750 Last data filed at 04/18/13 1759  Gross per 24 hour  Intake    250 ml  Output      0 ml  Net    250 ml    Physical Exam: Affect appropriate Obese black female HEENT: normal Neck supple with no adenopathy JVP normal no bruits no thyromegaly Lungs clear with no wheezing and good diaphragmatic motion Heart:  S1/S2 no murmur, no rub, gallop or click PMI normal Abdomen: benighn, BS positve, no tenderness, no AAA no bruit.  No HSM or HJR Distal pulses intact with no bruits No edema Neuro non-focal Skin warm and dry No muscular weakness   Lab Results: Basic Metabolic Panel:  Recent Labs  04/18/13 0430 04/18/13 0818  NA 141 140  K 3.0* 3.1*  CL 104 105  CO2 26 26  GLUCOSE 91 98  BUN 15 14  CREATININE 0.97 0.94  CALCIUM 8.4 8.6   Liver Function Tests:  Recent Labs  04/17/13 0225  AST 35  ALT 38*  ALKPHOS 115  BILITOT 0.9  PROT 7.0  ALBUMIN 3.3*    Recent Labs  04/17/13 0225  LIPASE 20   CBC:  Recent Labs  04/17/13 0225 04/18/13 0430 04/19/13 0415  WBC 12.1* 8.7 7.2  NEUTROABS 9.6*  --   --   HGB 9.6* 9.4* 9.8*  HCT 30.1* 29.2* 30.4*  MCV 74.0* 73.6* 74.0*  PLT 582* 543* 573*   Cardiac Enzymes:  Recent Labs  04/17/13 1121 04/17/13 1542 04/17/13 2142  TROPONINI 0.82* 0.90* 0.78*  Hemoglobin A1C:  Recent Labs  04/17/13 1122  HGBA1C 6.1*   Fasting Lipid Panel:  Recent Labs  04/17/13 1121  CHOL 204*   HDL 37*  LDLCALC 144*  TRIG 114  CHOLHDL 5.5   Thyroid Function Tests:  Recent Labs  04/17/13 1122  TSH 2.540    Imaging: Dg Chest Left Decubitus  04/17/2013  *RADIOLOGY REPORT*  Clinical Data: Chest pain.  CHEST - LEFT DECUBITUS  Comparison: 04/17/2013  Findings: Left side down decubitus view without  layering left- sided pleural effusion detected.  Pulmonary vascular congestion/mild pulmonary edema.  Cardiomegaly.  IMPRESSION: No layering left-sided pleural effusion.   Original Report Authenticated By: Genia Del, M.D.     Cardiac Studies:  Telemetry : NSR no VT or arrhythmia 04/19/2013    ECG :  SR rate 94 nonspecific ST/T wave changes  Echo: EF 30-35%  Medications:   . aspirin EC  81 mg Oral Daily  . atorvastatin  20 mg Oral q1800  . carvedilol  3.125 mg Oral BID WC  . ferrous sulfate  325 mg Oral Q breakfast  . furosemide  20 mg Oral Daily  . lisinopril  10 mg Oral Daily  . potassium chloride  40 mEq Oral BID     . sodium chloride 10 mL/hr (  04/17/13 1826)    Assessment/Plan:  CHF :  Good diuresis Continue ACE  D/C amlodipine and add low dose beta blocker and oral diuretic Discharge with above meds and KCL 40 am and 20 pm  F/U PA 2 weeks with BMET F/U me in 8 weeks Anemia: Low MCV iron studies guaic stools will need outpatient f/u and possible colonoscopy Has pimary MD That started her on iron  Jenkins Rouge 04/19/2013, 7:50 AM

## 2013-04-19 NOTE — Discharge Summary (Signed)
Patient ID: [name],  MRN: [id number], DOB/AGE: 1961/01/25 52 y.o.  Admit date: 04/17/2013 Discharge date: 04/19/2013  Primary Cardiologist: P. [name] Cancel, MD  Discharge Diagnoses Principal Problem:   Acute on chronic systolic CHF (congestive heart failure)  **EF 30-35% by echo this admission.  **Net negative diuresis of 2,263 mL this admission.  **Reduction in weight from 154->160 lbs with diuresis this admission.  Active Problems:   Nonischemic cardiomyopathy  **H/O nonobstructive cath in 2005.   Microcytic anemia   Elevated troponin  **No ischemic evaluation this admission.   Hypertension   Hypercholesterolemia  Allergies No Known Allergies  Procedures  2D Echocardiogram 5.1.2014  Study Conclusions  - Left ventricle: The cavity size was mildly dilated. Wall   thickness was normal. Systolic function was moderately to   severely reduced. The estimated ejection fraction was in   the range of 30% to 35%. Diffuse hypokinesis. Doppler   parameters are consistent with restrictive physiology,   indicative of decreased left ventricular diastolic   compliance and/or increased left atrial pressure. - Left atrium: The atrium was mildly dilated. - Pulmonary arteries: PA peak pressure: 30mm Hg (S). _____________  History of Present Illness  52 year old female with prior history of nonischemic cardiomyopathy dating back to 2005. EF was noted to have normalized by 2009.  Recently she has had several emergency room visits secondary to dyspnea. In the early morning hours of April 30, she developed recurrent dyspnea PND, orthopnea, weakness, fatigue, and also intermittent midsternal chest tightness and pressure occurring with both activity and rest. Because of acute worsening of symptoms overnight, she presented to ED where her initial troponin was found to be elevated at 0.66 and she was felt to have volume overload on exam. She was admitted for further evaluation and management of  acute on chronic systolic congestive heart failure and possible non-ST segment elevation myocardial infarction.  Hospital Course  Patient was placed on IV Lasix with good diuresis. For this admission, she had a net negative of greater than 2 L with reduction in weight by 4 pounds. In that setting, she did have significant clinical improvement. Of note, her troponin did remain mildly elevated with a relatively flat trend, peaking at 0.90. Ischemic evaluation was not pursued during this admission however may be considered in the outpatient setting. 2-D echocardiogram was performed on May 1 and showed an EF of 30-35% with diffuse hypokinesis. Beta blocker therapy was added to her regimen and her diuretics have been converted from IV to oral. She has been hypokalemic throughout admission and she has received appropriate supplementation. Her outpatient potassium dose has also been adjusted. Lastly, she has exhibited microcytic anemia throughout admission. She is on iron therapy which was started as an outpatient. We have recommended early care followup as she will likely require colonoscopy at some point. She is being discharged home today in good condition.   Discharge Vitals Blood pressure 124/86, pulse 85, temperature 98.6 F (37 C), temperature source Oral, resp. rate 18, height 5\' 1"  (1.549 m), weight 160 lb 4.4 oz (72.7 kg), last menstrual period 04/08/2013, SpO2 100.00%.  Filed Weights   04/17/13 0409 04/18/13 0447  Weight: 164 lb 14.5 oz (74.8 kg) 160 lb 4.4 oz (72.7 kg)   Labs  CBC  Recent Labs  04/17/13 0225 04/18/13 0430 04/19/13 0415  WBC 12.1* 8.7 7.2  NEUTROABS 9.6*  --   --   HGB 9.6* 9.4* 9.8*  HCT 30.1* 29.2* 30.4*  MCV 74.0* 73.6* 74.0*  PLT 582*  543* [id number]*   Basic Metabolic Panel  Recent Labs  04/18/13 0430 04/18/13 0818  NA 141 140  K 3.0* 3.1*  CL 104 105  CO2 26 26  GLUCOSE 91 98  BUN 15 14  CREATININE 0.97 0.94  CALCIUM 8.4 8.6   Liver Function  Tests  Recent Labs  04/17/13 0225  AST 35  ALT 38*  ALKPHOS 115  BILITOT 0.9  PROT 7.0  ALBUMIN 3.3*    Recent Labs  04/17/13 0225  LIPASE 20   Cardiac Enzymes  Recent Labs  04/17/13 1121 04/17/13 1542 04/17/13 2142  TROPONINI 0.82* 0.90* 0.78*   Hemoglobin A1C  Recent Labs  04/17/13 1122  HGBA1C 6.1*   Fasting Lipid Panel  Recent Labs  04/17/13 1121  CHOL 204*  HDL 37*  LDLCALC 144*  TRIG 114  CHOLHDL 5.5   Thyroid Function Tests  Recent Labs  04/17/13 1122  TSH 2.540   Disposition  Pt is being discharged home today in good condition.  Follow-up Plans & Appointments      Follow-up Information   Follow up with Jenkins Rouge, MD On 04/30/2013. (10:30 AM)    Contact information:   [address] [place] [address] [phone]       Follow up with Primary Care. (f/u with primary care within next 1-2 wks for further w/u of anemia.)     Discharge Medications    Medication List    STOP taking these medications       amLODipine 5 MG tablet  Commonly known as:  NORVASC      TAKE these medications       albuterol 108 (90 BASE) MCG/ACT inhaler  Commonly known as:  PROVENTIL HFA;VENTOLIN HFA  Inhale 2 puffs into the lungs every 4 (four) hours as needed for wheezing.     aspirin 81 MG EC tablet  Take 1 tablet (81 mg total) by mouth daily.     atorvastatin 20 MG tablet  Commonly known as:  LIPITOR  Take 1 tablet (20 mg total) by mouth daily at 6 PM.     carvedilol 3.125 MG tablet  Commonly known as:  COREG  Take 1 tablet (3.125 mg total) by mouth 2 (two) times daily with a meal.     ferrous sulfate 325 (65 FE) MG tablet  Take 325 mg by mouth daily with breakfast.     furosemide 20 MG tablet  Commonly known as:  LASIX  Take 1 tablet (20 mg total) by mouth daily.     lisinopril 10 MG tablet  Commonly known as:  PRINIVIL,ZESTRIL  Take 1 tablet (10 mg total) by mouth daily.     nitroGLYCERIN 0.4 MG SL tablet   Commonly known as:  NITROSTAT  Place 1 tablet (0.4 mg total) under the tongue every 5 (five) minutes x 3 doses as needed for chest pain.     Potassium Chloride ER 20 MEQ Tbcr  2 tabs daily in the AM and 1 tab daily in the PM.      Outstanding Labs/Studies  bmet @ f/u.  Duration of Discharge Encounter   Greater than 30 minutes including physician time.  Signed, Murray Hodgkins NP 04/19/2013, 11:49 AM

## 2013-04-22 NOTE — Telephone Encounter (Signed)
TCM phone call.  Patient contacted regarding discharge from [facility] Patient understands to follow up with Dr.Nishan and lab work on 5/13 at 10 30 am. Patient understands discharge instructions. Patient understands medications and regiment. Patient understands to bring all medications to this visit. She states that she feels well.

## 2013-04-24 ENCOUNTER — Other Ambulatory Visit: Payer: Self-pay

## 2013-04-24 ENCOUNTER — Emergency Department (HOSPITAL_COMMUNITY)
Admission: EM | Admit: 2013-04-24 | Discharge: 2013-04-24 | Disposition: A | Discharge status: Home / Self Care | Payer: Medicaid Other | Attending: Emergency Medicine | Admitting: Emergency Medicine

## 2013-04-24 ENCOUNTER — Encounter (HOSPITAL_COMMUNITY): Payer: Self-pay

## 2013-04-24 ENCOUNTER — Emergency Department (HOSPITAL_COMMUNITY): Payer: Medicaid Other

## 2013-04-24 ENCOUNTER — Encounter (HOSPITAL_COMMUNITY): Payer: Self-pay | Admitting: *Deleted

## 2013-04-24 ENCOUNTER — Emergency Department (INDEPENDENT_AMBULATORY_CARE_PROVIDER_SITE_OTHER)
Admission: EM | Admit: 2013-04-24 | Discharge: 2013-04-24 | Disposition: A | Discharge status: Other Acute Inpatient Hospital | Payer: Self-pay | Source: Home / Self Care | Attending: Family Medicine | Admitting: Family Medicine

## 2013-04-24 DIAGNOSIS — I509 Heart failure, unspecified: Secondary | ICD-10-CM

## 2013-04-24 DIAGNOSIS — R0602 Shortness of breath: Secondary | ICD-10-CM

## 2013-04-24 DIAGNOSIS — J45909 Unspecified asthma, uncomplicated: Secondary | ICD-10-CM | POA: Insufficient documentation

## 2013-04-24 DIAGNOSIS — R109 Unspecified abdominal pain: Secondary | ICD-10-CM

## 2013-04-24 DIAGNOSIS — R188 Other ascites: Secondary | ICD-10-CM

## 2013-04-24 DIAGNOSIS — K8020 Calculus of gallbladder without cholecystitis without obstruction: Secondary | ICD-10-CM | POA: Insufficient documentation

## 2013-04-24 DIAGNOSIS — E7800 Pure hypercholesterolemia, unspecified: Secondary | ICD-10-CM | POA: Insufficient documentation

## 2013-04-24 DIAGNOSIS — R5381 Other malaise: Secondary | ICD-10-CM | POA: Insufficient documentation

## 2013-04-24 DIAGNOSIS — I10 Essential (primary) hypertension: Secondary | ICD-10-CM | POA: Insufficient documentation

## 2013-04-24 DIAGNOSIS — K8050 Calculus of bile duct without cholangitis or cholecystitis without obstruction: Secondary | ICD-10-CM

## 2013-04-24 DIAGNOSIS — Z79899 Other long term (current) drug therapy: Secondary | ICD-10-CM | POA: Insufficient documentation

## 2013-04-24 DIAGNOSIS — Z7982 Long term (current) use of aspirin: Secondary | ICD-10-CM | POA: Insufficient documentation

## 2013-04-24 DIAGNOSIS — I5022 Chronic systolic (congestive) heart failure: Secondary | ICD-10-CM | POA: Insufficient documentation

## 2013-04-24 DIAGNOSIS — Z8679 Personal history of other diseases of the circulatory system: Secondary | ICD-10-CM | POA: Insufficient documentation

## 2013-04-24 LAB — CBC WITH DIFFERENTIAL/PLATELET
Basophils Absolute: 0.1 10*3/uL (ref 0.0–0.1)
Eosinophils Relative: 0 % (ref 0–5)
HCT: 30.4 % — ABNORMAL LOW (ref 36.0–46.0)
Lymphocytes Relative: 22 % (ref 12–46)
MCH: 24.4 pg — ABNORMAL LOW (ref 26.0–34.0)
MCV: 76.4 fL — ABNORMAL LOW (ref 78.0–100.0)
Monocytes Absolute: 0.5 10*3/uL (ref 0.1–1.0)
RDW: 19.7 % — ABNORMAL HIGH (ref 11.5–15.5)
WBC: 7.7 10*3/uL (ref 4.0–10.5)

## 2013-04-24 LAB — COMPREHENSIVE METABOLIC PANEL
Alkaline Phosphatase: 147 U/L — ABNORMAL HIGH (ref 39–117)
CO2: 22 mEq/L (ref 19–32)
Calcium: 9.2 mg/dL (ref 8.4–10.5)
Creatinine, Ser: 0.94 mg/dL (ref 0.50–1.10)
Glucose, Bld: 105 mg/dL — ABNORMAL HIGH (ref 70–99)
Total Bilirubin: 0.7 mg/dL (ref 0.3–1.2)

## 2013-04-24 LAB — LIPASE, BLOOD: Lipase: 19 U/L (ref 11–59)

## 2013-04-24 MED ORDER — ASPIRIN 81 MG PO CHEW
243.0000 mg | CHEWABLE_TABLET | Freq: Once | ORAL | Status: AC
  Administered 2013-04-24: 243 mg via ORAL
  Filled 2013-04-24: qty 3

## 2013-04-24 MED ORDER — PROMETHAZINE HCL 25 MG PO TABS: 25.0000 mg | ORAL_TABLET | Freq: Four times a day (QID) | ORAL | Status: DC | PRN

## 2013-04-24 MED ORDER — ASPIRIN 81 MG PO CHEW: 324.0000 mg | CHEWABLE_TABLET | Freq: Once | ORAL | Status: DC

## 2013-04-24 MED ORDER — ONDANSETRON HCL 4 MG PO TABS: 4.0000 mg | ORAL_TABLET | Freq: Three times a day (TID) | ORAL | Status: DC | PRN

## 2013-04-24 NOTE — ED Notes (Signed)
The pt is a transfer from [facility] for 3-4 days.  She was told she was being transferred from there because she has excess fluid in her abd. Non-productive cough

## 2013-04-24 NOTE — ED Notes (Signed)
Phlebotomy at bedside.

## 2013-04-24 NOTE — ED Notes (Signed)
Cardiology at bedside.

## 2013-04-24 NOTE — ED Notes (Signed)
Dr Miller at bedside. 

## 2013-04-24 NOTE — ED Provider Notes (Signed)
History     CSN: [id number]  Arrival date & time 04/24/13  1748   First MD Initiated Contact with Patient 04/24/13 1759      Chief Complaint  Patient presents with  . Shortness of Breath    (Consider location/radiation/quality/duration/timing/severity/associated sxs/prior treatment) HPI Comments: 52 year old female with a history of hypertension, chronic congestive heart failure, nonischemic cardiomyopathy based on catheterization in 2005 as well as an echocardiogram in 2014 showing an ejection fraction of 30-35%. She presents today because of having epigastric discomfort. She states that she was recently admitted to the hospital and during that admission she was found to have fluid overload, was diuresed and did very well. Since going home she has family she has had some difficulty laying down at night but has been able to sleep on her side which she does not usually do because of dyspnea and orthopnea.  She describes a discomfort in her epigastrium which occurs after eating or drinking, limits her intake and states that she feels distended and swollen in her epigastrium. She denies a history of pancreatitis, cholecystitis and does not drink alcohol. She has never had abdominal surgery other than a tubal ligation. She denies nausea, vomiting, states she has a chronic mild cough and denies any swelling in her lower extremities. She has been taking daily weights and denies any increase in her weight.  Patient is a 52 y.o. female presenting with shortness of breath. The history is provided by the patient and medical records.  Shortness of Breath   Past Medical History  Diagnosis Date  . Hypertension   . Hypercholesterolemia   . Chronic systolic CHF (congestive heart failure)   . Asthma   . Nonischemic cardiomyopathy     a. 2005 Cath: nonobstructive dzs;  b. 04/2013 Echo: EF 3035%, diff HK, restrictive physiology, mildly dil LA, PASP 52mmHg.  . Microcytic anemia     History reviewed. No  pertinent past surgical history.  No family history on file.  History  Substance Use Topics  . Smoking status: Never Smoker   . Smokeless tobacco: Never Used  . Alcohol Use: No    OB History   Grav Para Term Preterm Abortions TAB SAB Ect Mult Living                  Review of Systems  Respiratory: Positive for shortness of breath.   All other systems reviewed and are negative.    Allergies  Review of patient's allergies indicates no known allergies.  Home Medications   Current Outpatient Rx  Name  Route  Sig  Dispense  Refill  . albuterol (PROVENTIL HFA;VENTOLIN HFA) 108 (90 BASE) MCG/ACT inhaler   Inhalation   Inhale 2 puffs into the lungs every 4 (four) hours as needed for wheezing.   1 Inhaler   0   . aspirin EC 81 MG EC tablet   Oral   Take 1 tablet (81 mg total) by mouth daily.         Marland Kitchen atorvastatin (LIPITOR) 20 MG tablet   Oral   Take 1 tablet (20 mg total) by mouth daily at 6 PM.   30 tablet   6   . carvedilol (COREG) 3.125 MG tablet   Oral   Take 1 tablet (3.125 mg total) by mouth 2 (two) times daily with a meal.   60 tablet   6   . ferrous sulfate 325 (65 FE) MG tablet   Oral   Take 325 mg by mouth  daily with breakfast.         . furosemide (LASIX) 20 MG tablet   Oral   Take 1 tablet (20 mg total) by mouth daily.   30 tablet   6   . lisinopril (PRINIVIL,ZESTRIL) 10 MG tablet   Oral   Take 1 tablet (10 mg total) by mouth daily.   30 tablet   0   . nitroGLYCERIN (NITROSTAT) 0.4 MG SL tablet   Sublingual   Place 1 tablet (0.4 mg total) under the tongue every 5 (five) minutes x 3 doses as needed for chest pain.   25 tablet   3   . potassium chloride (KLOR-CON) 20 MEQ packet   Oral   Take 20-40 mEq by mouth 2 (two) times daily. Take 2 tablets every morning and take 1 tablet every evening         . ondansetron (ZOFRAN) 4 MG tablet   Oral   Take 1 tablet (4 mg total) by mouth every 8 (eight) hours as needed for nausea.   20  tablet   0   . promethazine (PHENERGAN) 25 MG tablet   Oral   Take 1 tablet (25 mg total) by mouth every 6 (six) hours as needed for nausea.   12 tablet   0     BP 138/82  Pulse 81  Temp(Src) 98.1 F (36.7 C) (Oral)  Resp 19  SpO2 100%  LMP 04/08/2013  Physical Exam  Nursing note and vitals reviewed. Constitutional: She appears well-developed and well-nourished. No distress.  HENT:  Head: Normocephalic and atraumatic.  Mouth/Throat: Oropharynx is clear and moist. No oropharyngeal exudate.  Eyes: Conjunctivae and EOM are normal. Pupils are equal, round, and reactive to light. Right eye exhibits no discharge. Left eye exhibits no discharge. No scleral icterus.  Neck: Normal range of motion. Neck supple. No JVD present. No thyromegaly present.  Cardiovascular: Normal rate, regular rhythm, normal heart sounds and intact distal pulses.  Exam reveals no gallop and no friction rub.   No murmur heard. Pulmonary/Chest: Effort normal and breath sounds normal. No respiratory distress. She has no wheezes. She has no rales.  Abdominal: Soft. Bowel sounds are normal. She exhibits distension ( Mild epigastric distention). There is tenderness ( Mild epigastric tenderness which is reproducible). There is no rebound and no guarding.  Musculoskeletal: Normal range of motion. She exhibits no edema and no tenderness.  Lymphadenopathy:    She has no cervical adenopathy.  Neurological: She is alert. Coordination normal.  Skin: Skin is warm and dry. No rash noted. No erythema.  Psychiatric: She has a normal mood and affect. Her behavior is normal.    ED Course  Procedures (including critical care time)  Labs Reviewed  CBC WITH DIFFERENTIAL - Abnormal; Notable for the following:    Hemoglobin 9.7 (*)    HCT 30.4 (*)    MCV 76.4 (*)    MCH 24.4 (*)    RDW 19.7 (*)    Platelets 455 (*)    All other components within normal limits  COMPREHENSIVE METABOLIC PANEL - Abnormal; Notable for the  following:    Glucose, Bld 105 (*)    Albumin 3.2 (*)    AST 41 (*)    ALT 47 (*)    Alkaline Phosphatase 147 (*)    GFR calc non Af Amer 68 (*)    GFR calc Af Amer 79 (*)    All other components within normal limits  TROPONIN I - Abnormal; Notable  for the following:    Troponin I 0.76 (*)    All other components within normal limits  PRO B NATRIURETIC PEPTIDE - Abnormal; Notable for the following:    Pro B Natriuretic peptide (BNP) 2720.0 (*)    All other components within normal limits  LIPASE, BLOOD   Dg Chest 2 View  04/24/2013  *RADIOLOGY REPORT*  Clinical Data: Short of breath  CHEST - 2 VIEW  Comparison: 04/17/2013  Findings: Cardiac enlargement without heart failure.  Small left effusion is present.  Negative for pneumonia.  Right lung is clear.  IMPRESSION: Small left pleural effusion.   Original Report Authenticated By: Carl Best, M.D.    US Abdomen Complete  04/24/2013  *RADIOLOGY REPORT*  Clinical Data:  Epigastric pain  COMPLETE ABDOMINAL ULTRASOUND  Comparison:  CT chest 03/18/2013  Findings:  Gallbladder:  Gallbladder is partially collapsed and thick-walled at 4 mm.  Trace pericholecystic fluid.  No gallstones.  Negative sonographic Murphy's sign.  Common bile duct:  Normal at 2 mm.  Liver:  No focal lesion identified.  Within normal limits in parenchymal echogenicity.  IVC:  Appears normal.  Pancreas:  No focal abnormality seen.  Spleen:  Normal size and echogenicity.  Right Kidney:  No hydronephrosis.  Left Kidney:  No hydronephrosis.  Abdominal aorta:  No aneurysm identified.  Bilateral pleural effusions.  Small amount of free fluid the abdomen and along the gallbladder.  IMPRESSION:  1.  The gallbladder is thick-walled and collapsed with small amount pericholecystic fluid.  Negative sonographic Murphy's sign.  2.  Bilateral pleural effusions.  3.  Small amount intraperitoneal free fluid.   Original Report Authenticated By: Suzy Bouchard, M.D.      1. Shortness of breath    2. Ascites   3. CHF (congestive heart failure)   4. Biliary colic       MDM  The patient has no JVD, no peripheral edema and a soft abdomen with minimal tenderness. She does not appear to have a tense tight abdomen and I do not think this is ascites. I do believe that she has a gastrointestinal-related complaints, will evaluate with lipase, cooperative metabolic panel and her CBC, would also benefit from cardiac testing with EKG and troponin though this does not sound primarily cardiac in nature she does have her stackers with a history of congestive heart failure. She has been taking her diuretics and has been urinating appropriately, no weight gain and no peripheral or pulmonary edema on my exam. Her EKG shows a sinus rhythm, nonspecific T waves, similar to prior EKG.  ED ECG REPORT  I personally interpreted this EKG   Date: 04/24/2013   Rate: 78  Rhythm: normal sinus rhythm  QRS Axis: normal  Intervals: normal  ST/T Wave abnormalities: nonspecific T wave changes  Conduction Disutrbances:none  Narrative Interpretation:   Old EKG Reviewed: Compared with may first 2014, no changes  D/w Dr. Collins Scotland who will see in ED - from cardiology   Dr. Collins Scotland has seen the patient and does not believe that this elevated troponin is related to a cardiac source.  He has stated that no provocative testing is needed and the the troponin is chronically elevated.  Her VS have been normal, here ECG is unchanged and her pain is located and in the epigastrium, initially reproducible on palpation but is now pain free.  US shows some soft signs of cholecystitis.  No leukocytosis, no pain at this time and the pt is requesting d/c so she  can go home.  I have given her strict recommendations for followup, I have discussed her care with general surgery Dr. Barry Dienes who agrees the patient can followup in the clinic for what is likely biliary colic. Meds given in ED:  Medications  aspirin chewable tablet 243 mg (243 mg  Oral Given 04/24/13 2054)    New Prescriptions   ONDANSETRON (ZOFRAN) 4 MG TABLET    Take 1 tablet (4 mg total) by mouth every 8 (eight) hours as needed for nausea.   PROMETHAZINE (PHENERGAN) 25 MG TABLET    Take 1 tablet (25 mg total) by mouth every 6 (six) hours as needed for nausea.      Johnna Acosta, MD 04/24/13 [phone]

## 2013-04-24 NOTE — ED Notes (Addendum)
Reports she ha sad problems w her abdominal area swelling and being bloated for about a year,  States that on her last admission to the hospital, she tried to to tee them she was SOB since her stomach was so swollen, but when they heard SOB, they immediately concluded she was having a heart issue because of her medical history( Was last hospital from 4-30 til 5-2 for heart work up) last BM was yesterday, "a good one"

## 2013-04-24 NOTE — ED Notes (Signed)
Pt taken to ultrasound via [facility]

## 2013-04-24 NOTE — Consult Note (Signed)
Cardiology Consult Note  CC: epigastric pain  History of Present Illness (and review of medical records): [facility] is a 52 y.o. female with hx below relevant for idiopathic dilated cardiomyopathy, recent admission for decompensated CHF who returned to the ED this evening with progressive epigastric discomfort. Patient reports that she has had these symptoms for several weeks but they have progressed over the last several days since she was discharged. Pain is described as burning and sometimes crampy "like a ball", located in her epigastric region. Episodic without apparent triggers. Sometimes a/w nausea and emesis. Reproducible. In ED, her evaluation was notable for a mildly elevated troponin that is essentially unchanged from her recent admission. We are asked to assist in evaluation and mgmt.   Of note, the patient was just discharged from Cardiology Service with acute decompensated CHF. She presented at that time with progressive dyspnea, orthopnea, as well as mid sternal chest tightness/pressure. She denies any of these sx at this time and actually reports breathing is much improved, weight is stable and LE edema is gone. She has been very compliant with her new medication program.   Previous diagnostic testing for coronary artery disease includes: cardiac catheterization and echocardiogram. Previous history of cardiac disease includes Cardiomyopathy CHF Coronary Artery Disease. Coronary artery disease risk factors include: dyslipidemia, hypertension and sedentary lifestyle. Patient denies history of coronary angioplasty, coronary artery stent and previous M.I..  Review of Systems No fevers, chills, no wheezing.  Did have some vomiting. Further review of systems was otherwise negative other than stated in HPI.  Patient Active Problem List   Diagnosis Date Noted  . Acute systolic CHF (congestive heart failure) 04/19/2013  . Acute on chronic systolic CHF (congestive heart failure)  04/19/2013  . Elevated troponin 04/19/2013  . Nonischemic cardiomyopathy   . Hypertension   . Hypercholesterolemia   . Microcytic anemia    Past Medical History  Diagnosis Date  . Hypertension   . Hypercholesterolemia   . Chronic systolic CHF (congestive heart failure)   . Asthma   . Nonischemic cardiomyopathy     a. 2005 Cath: nonobstructive dzs;  b. 04/2013 Echo: EF 3035%, diff HK, restrictive physiology, mildly dil LA, PASP 34mmHg.  . Microcytic anemia     History reviewed. No pertinent past surgical history.   (Not in a hospital admission) No Known Allergies  History  Substance Use Topics  . Smoking status: Never Smoker   . Smokeless tobacco: Never Used  . Alcohol Use: No    No current facility-administered medications on file prior to encounter.   Current Outpatient Prescriptions on File Prior to Encounter  Medication Sig Dispense Refill  . albuterol (PROVENTIL HFA;VENTOLIN HFA) 108 (90 BASE) MCG/ACT inhaler Inhale 2 puffs into the lungs every 4 (four) hours as needed for wheezing.  1 Inhaler  0  . aspirin EC 81 MG EC tablet Take 1 tablet (81 mg total) by mouth daily.      Marland Kitchen atorvastatin (LIPITOR) 20 MG tablet Take 1 tablet (20 mg total) by mouth daily at 6 PM.  30 tablet  6  . carvedilol (COREG) 3.125 MG tablet Take 1 tablet (3.125 mg total) by mouth 2 (two) times daily with a meal.  60 tablet  6  . ferrous sulfate 325 (65 FE) MG tablet Take 325 mg by mouth daily with breakfast.      . furosemide (LASIX) 20 MG tablet Take 1 tablet (20 mg total) by mouth daily.  30 tablet  6  . lisinopril (  PRINIVIL,ZESTRIL) 10 MG tablet Take 1 tablet (10 mg total) by mouth daily.  30 tablet  0  . nitroGLYCERIN (NITROSTAT) 0.4 MG SL tablet Place 1 tablet (0.4 mg total) under the tongue every 5 (five) minutes x 3 doses as needed for chest pain.  25 tablet  3    FH: Relevant for multiple family members (brother, sister, nephew) with diagnosis of idiopathic dilated cardiomyopathy    Objective: Patient Vitals for the past 8 hrs:  BP Temp Temp src Pulse Resp SpO2  04/24/13 2056 148/89 mmHg - - 83 22 100 %  04/24/13 1901 125/87 mmHg - - 82 24 100 %  04/24/13 1752 135/77 mmHg 97.4 F (36.3 C) Oral 87 16 100 %   General Appearance:    Alert, cooperative, no distress,  Head:    Normocephalic, without obvious abnormality, atraumatic  Eyes:     Anicteric sclerae  Neck:   Supple, JVP < 8 cm H20   Lungs:     Clear to auscultation bilaterally, respirations unlabored  Heart:    Regular rate and rhythm, S1 and S2 normal, no murmur  Abdomen:     Non-distended. Modestly tender to palpation in the epigastric region. No rebound, guarding.   Extremities:   Warm, well-perfused. Extremities normal, atraumatic, no peripheral edema  Pulses:   2+ and symmetric all extremities  Skin:   no rashes or lesions  Neurologic:   No focal deficits. AAO x3   Results for orders placed during the hospital encounter of 04/24/13 (from the past 48 hour(s))  LIPASE, BLOOD     Status: None   Collection Time    04/24/13  6:37 PM      Result Value Range   Lipase 19  11 - 59 U/L  TROPONIN I     Status: Abnormal   Collection Time    04/24/13  6:38 PM      Result Value Range   Troponin I 0.76 (*) <0.30 ng/mL   Comment:            Due to the release kinetics of cTnI,     a negative result within the first hours     of the onset of symptoms does not rule out     myocardial infarction with certainty.     If myocardial infarction is still suspected,     repeat the test at appropriate intervals.     CRITICAL VALUE NOTED.  VALUE IS CONSISTENT WITH PREVIOUSLY REPORTED AND CALLED VALUE.  CBC WITH DIFFERENTIAL     Status: Abnormal   Collection Time    04/24/13  6:44 PM      Result Value Range   WBC 7.7  4.0 - 10.5 K/uL   RBC 3.98  3.87 - 5.11 MIL/uL   Hemoglobin 9.7 (*) 12.0 - 15.0 g/dL   HCT 30.4 (*) 36.0 - 46.0 %   MCV 76.4 (*) 78.0 - 100.0 fL   MCH 24.4 (*) 26.0 - 34.0 pg   MCHC 31.9  30.0 -  36.0 g/dL   RDW 19.7 (*) 11.5 - 15.5 %   Platelets 455 (*) 150 - 400 K/uL   Neutrophils Relative 70  43 - 77 %   Neutro Abs 5.4  1.7 - 7.7 K/uL   Lymphocytes Relative 22  12 - 46 %   Lymphs Abs 1.7  0.7 - 4.0 K/uL   Monocytes Relative 6  3 - 12 %   Monocytes Absolute 0.5  0.1 - 1.0 K/uL  Eosinophils Relative 0  0 - 5 %   Eosinophils Absolute 0.0  0.0 - 0.7 K/uL   Basophils Relative 1  0 - 1 %   Basophils Absolute 0.1  0.0 - 0.1 K/uL  COMPREHENSIVE METABOLIC PANEL     Status: Abnormal   Collection Time    04/24/13  6:44 PM      Result Value Range   Sodium 139  135 - 145 mEq/L   Potassium 4.4  3.5 - 5.1 mEq/L   Chloride 107  96 - 112 mEq/L   CO2 22  19 - 32 mEq/L   Glucose, Bld 105 (*) 70 - 99 mg/dL   BUN 9  6 - 23 mg/dL   Creatinine, Ser 0.94  0.50 - 1.10 mg/dL   Calcium 9.2  8.4 - 10.5 mg/dL   Total Protein 6.9  6.0 - 8.3 g/dL   Albumin 3.2 (*) 3.5 - 5.2 g/dL   AST 41 (*) 0 - 37 U/L   ALT 47 (*) 0 - 35 U/L   Alkaline Phosphatase 147 (*) 39 - 117 U/L   Total Bilirubin 0.7  0.3 - 1.2 mg/dL   GFR calc non Af Amer 68 (*) >90 mL/min   GFR calc Af Amer 79 (*) >90 mL/min   Comment:            The eGFR has been calculated     using the CKD EPI equation.     This calculation has not been     validated in all clinical     situations.     eGFR's persistently     <90 mL/min signify     possible Chronic Kidney Disease.   Dg Chest 2 View  04/24/2013  *RADIOLOGY REPORT*  Clinical Data: Short of breath  CHEST - 2 VIEW  Comparison: 04/17/2013  Findings: Cardiac enlargement without heart failure.  Small left effusion is present.  Negative for pneumonia.  Right lung is clear.  IMPRESSION: Small left pleural effusion.   Original Report Authenticated By: Carl Best, M.D.     ECG:  Sinus rhythm. LVH with associated repol abnormality.   Echo: 2009 - Overall left ventricular systolic function was normal. Left ventricular ejection fraction was estimated to be 55 %. There were no left  ventricular regional wall motion abnormalities. Left ventricular wall thickness was at the upper limits of normal. - There was very mild mitral annular calcification. Cath: 2005 VENTRICULOGRAPHY:  Ventriculography in both the RAO and LAO projection revealed mildly dilated  left ventricular cavity. There was global hypokinesis. Ejection fraction  calculated at 30%. The left ventricular end-diastolic pressure was 25. No  significant mitral regurgitation was seen on ventriculography.  CORONARY ARTERIOGRAPHY:  There was mild calcification seen on fluoroscopy in the distribution of the  mid LAD.  1. Left main normal. It bifurcated.  2. LAD: The LAD crossed the apex of the heart. There were mild  irregularities in the proximal and mid segment all less than 30%. There  were three diagonals that were relatively small and the distal LAD as it  crossed the apex was small.  3. Circumflex: Mild irregularities in the proximal circumflex were noted.  The distal circumflex as it bifurcated into OM #2 and OM #3 was  relatively small. The ostium of the first marginal vessel was about 60%  narrowed. OM #2 and OM #3 were small in diameter, but did not have  significant disease.  4. Right coronary artery: The right coronary artery has  a proximal area of  30% narrowing and a mid area of 30% narrowing. The PDA and the  posterolateral vessels were free of disease.   Assessment: 34 AAF with idiopathic dilated cardiomyopathy now with reproducible and seemingly non-cardiac epigastric discomfort. She is currently well-compensated as her CV exam is notable for a euvolemic and well-perfused state. ECG is stable from prior studies and shows no evidence of acute ischemia. Troponin is noted to be mildly elevated but this is also stable from her recent admission. Clinical picture is not consistent with acute coronary syndrome, and this lab abnormality is presumably due to her underlying severe structural heart disease.  Recommend the following:   Recommendations: 1. No further cardiac testing is necessary at this time.  2. Would continue patient's home cardiac program.  3. Consider alternative possibilities to explain epigastric discomfort including peptic ulcer disease (esp. given microcytic anemia).   - Would check for H. Pylori and begin PPI trial.  4. Patient should follow up with Dr. Jenkins Rouge as previously scheduled.  5. Thanks for consult. Call with ?s.

## 2013-04-24 NOTE — ED Notes (Signed)
Pt returned from ultrasound

## 2013-04-24 NOTE — ED Provider Notes (Signed)
History     CSN: [id number]  Arrival date & time 04/24/13  1748   None     Chief Complaint  Patient presents with  . Shortness of Breath    (Consider location/radiation/quality/duration/timing/severity/associated sxs/prior treatment) HPI Comments: And year-old female with history of congestive heart failure with an ejection fraction or between 30 and 35%. Patient discharge weight 154 pounds on recent hospital discharge (April 30). Here complaining of shortness of breath and abdominal discomfort and increasing abdominal girth during the last month. Patient currently unable to rest lying or reclining to 2 abdominal discomfort and shortness of breath. She's taking Lasix 20 mg daily and reports compliance. Has cardiology appointment on May 14. She could not wait until her next appointment due to discomfort. Reports regular normal bowel movements. No weight loss. No melena. Patient denies chest pain here. Reports shortness of breath is triggered with position changes. Patient reports that she currently has to wear female pants as her cloth are not fitting.  Patient is a 52 y.o. female presenting with shortness of breath.  Shortness of Breath Associated symptoms: abdominal pain   Associated symptoms: no chest pain, no diaphoresis, no fever, no headaches, no rash and no vomiting     Past Medical History  Diagnosis Date  . Hypertension   . Hypercholesterolemia   . Chronic systolic CHF (congestive heart failure)   . Asthma   . Nonischemic cardiomyopathy     a. 2005 Cath: nonobstructive dzs;  b. 04/2013 Echo: EF 3035%, diff HK, restrictive physiology, mildly dil LA, PASP 33mmHg.  . Microcytic anemia     History reviewed. No pertinent past surgical history.  No family history on file.  History  Substance Use Topics  . Smoking status: Never Smoker   . Smokeless tobacco: Never Used  . Alcohol Use: No    OB History   Grav Para Term Preterm Abortions TAB SAB Ect Mult Living                  Review of Systems  Constitutional: Positive for fatigue. Negative for fever, diaphoresis and appetite change.  Respiratory: Positive for shortness of breath.   Cardiovascular: Positive for leg swelling. Negative for chest pain.  Gastrointestinal: Positive for abdominal pain and abdominal distention. Negative for nausea, vomiting and diarrhea.  Endocrine: Negative for cold intolerance, heat intolerance, polydipsia, polyphagia and polyuria.  Skin: Negative for rash.  Neurological: Negative for dizziness and headaches.  All other systems reviewed and are negative.    Allergies  Review of patient's allergies indicates no known allergies.  Home Medications   Current Outpatient Rx  Name  Route  Sig  Dispense  Refill  . albuterol (PROVENTIL HFA;VENTOLIN HFA) 108 (90 BASE) MCG/ACT inhaler   Inhalation   Inhale 2 puffs into the lungs every 4 (four) hours as needed for wheezing.   1 Inhaler   0   . aspirin EC 81 MG EC tablet   Oral   Take 1 tablet (81 mg total) by mouth daily.         Marland Kitchen atorvastatin (LIPITOR) 20 MG tablet   Oral   Take 1 tablet (20 mg total) by mouth daily at 6 PM.   30 tablet   6   . carvedilol (COREG) 3.125 MG tablet   Oral   Take 1 tablet (3.125 mg total) by mouth 2 (two) times daily with a meal.   60 tablet   6   . ferrous sulfate 325 (65 FE) MG tablet  Oral   Take 325 mg by mouth daily with breakfast.         . furosemide (LASIX) 20 MG tablet   Oral   Take 1 tablet (20 mg total) by mouth daily.   30 tablet   6   . lisinopril (PRINIVIL,ZESTRIL) 10 MG tablet   Oral   Take 1 tablet (10 mg total) by mouth daily.   30 tablet   0   . nitroGLYCERIN (NITROSTAT) 0.4 MG SL tablet   Sublingual   Place 1 tablet (0.4 mg total) under the tongue every 5 (five) minutes x 3 doses as needed for chest pain.   25 tablet   3   . potassium chloride 20 MEQ TBCR      2 tabs daily in the AM and 1 tab daily in the PM.   90 tablet   6     BP 135/77   Pulse 87  Temp(Src) 97.4 F (36.3 C) (Oral)  Resp 16  SpO2 100%  LMP 04/08/2013  Physical Exam  Nursing note and vitals reviewed. Constitutional: She is oriented to person, place, and time. She appears well-developed and well-nourished.  uncomfortable if laying flat in bed.  Eyes: No scleral icterus.  Neck: Neck supple. No JVD present. No thyromegaly present.  Cardiovascular: Normal rate, regular rhythm and normal heart sounds.   Pulmonary/Chest: Effort normal and breath sounds normal. No respiratory distress. She has no wheezes. She has no rales. She exhibits no tenderness.  Abdominal: She exhibits no mass. There is tenderness. There is no rebound and no guarding.  Impress moderate ascites.  Neurological: She is alert and oriented to person, place, and time.  Skin: She is not diaphoretic.    ED Course  Procedures (including critical care time)  Labs Reviewed  CBC WITH DIFFERENTIAL  COMPREHENSIVE METABOLIC PANEL   No results found.   1. Shortness of breath   2. Ascites   3. CHF (congestive heart failure)       MDM  52 y/o female with h/o CHF. Here c/o SOB, abdominal pain and increased abdominal girth. On exam vital signs are normal and stable. Lungs are clear to auscultation.  Impress  ascites. Appears patient has gained back the weight she lost with diuresis during that hospital admission.  Decided to transfer to the emergency department for further evaluation and management, she might benefit from therapeutic paracentesis if appropriate        Randa Spike, MD 04/24/13 [id number]

## 2013-04-30 ENCOUNTER — Other Ambulatory Visit: Payer: Self-pay

## 2013-04-30 ENCOUNTER — Encounter: Payer: Self-pay | Admitting: Cardiovascular Disease

## 2013-05-02 ENCOUNTER — Ambulatory Visit (INDEPENDENT_AMBULATORY_CARE_PROVIDER_SITE_OTHER): Payer: Self-pay | Admitting: General Surgery

## 2013-05-21 ENCOUNTER — Encounter (HOSPITAL_COMMUNITY): Payer: Self-pay | Admitting: *Deleted

## 2013-05-21 DIAGNOSIS — Z8639 Personal history of other endocrine, nutritional and metabolic disease: Secondary | ICD-10-CM | POA: Insufficient documentation

## 2013-05-21 DIAGNOSIS — Z8679 Personal history of other diseases of the circulatory system: Secondary | ICD-10-CM | POA: Insufficient documentation

## 2013-05-21 DIAGNOSIS — Z7982 Long term (current) use of aspirin: Secondary | ICD-10-CM | POA: Insufficient documentation

## 2013-05-21 DIAGNOSIS — Z862 Personal history of diseases of the blood and blood-forming organs and certain disorders involving the immune mechanism: Secondary | ICD-10-CM | POA: Insufficient documentation

## 2013-05-21 DIAGNOSIS — R0602 Shortness of breath: Secondary | ICD-10-CM | POA: Insufficient documentation

## 2013-05-21 DIAGNOSIS — B830 Visceral larva migrans: Secondary | ICD-10-CM | POA: Insufficient documentation

## 2013-05-21 DIAGNOSIS — J159 Unspecified bacterial pneumonia: Secondary | ICD-10-CM | POA: Insufficient documentation

## 2013-05-21 DIAGNOSIS — J45901 Unspecified asthma with (acute) exacerbation: Secondary | ICD-10-CM | POA: Insufficient documentation

## 2013-05-21 DIAGNOSIS — I5023 Acute on chronic systolic (congestive) heart failure: Secondary | ICD-10-CM | POA: Insufficient documentation

## 2013-05-21 DIAGNOSIS — K8020 Calculus of gallbladder without cholecystitis without obstruction: Secondary | ICD-10-CM | POA: Insufficient documentation

## 2013-05-21 DIAGNOSIS — I10 Essential (primary) hypertension: Secondary | ICD-10-CM | POA: Insufficient documentation

## 2013-05-21 DIAGNOSIS — Z79899 Other long term (current) drug therapy: Secondary | ICD-10-CM | POA: Insufficient documentation

## 2013-05-21 DIAGNOSIS — M7989 Other specified soft tissue disorders: Secondary | ICD-10-CM | POA: Insufficient documentation

## 2013-05-21 LAB — COMPREHENSIVE METABOLIC PANEL
ALT: 21 U/L (ref 0–35)
AST: 26 U/L (ref 0–37)
CO2: 27 mEq/L (ref 19–32)
Calcium: 9 mg/dL (ref 8.4–10.5)
Chloride: 101 mEq/L (ref 96–112)
GFR calc non Af Amer: 68 mL/min — ABNORMAL LOW (ref >90)
Potassium: 3.3 mEq/L — ABNORMAL LOW (ref 3.5–5.1)
Sodium: 140 mEq/L (ref 135–145)

## 2013-05-21 LAB — PREGNANCY, URINE

## 2013-05-21 LAB — URINALYSIS, ROUTINE W REFLEX MICROSCOPIC
Nitrite: NEGATIVE
Specific Gravity, Urine: 1.017 (ref 1.005–1.030)
Urobilinogen, UA: 1 mg/dL (ref 0.0–1.0)
pH: 5.5 (ref 5.0–8.0)

## 2013-05-21 LAB — URINE MICROSCOPIC-ADD ON

## 2013-05-21 LAB — CBC WITH DIFFERENTIAL/PLATELET
Basophils Absolute: 0.1 10*3/uL (ref 0.0–0.1)
Lymphocytes Relative: 27 % (ref 12–46)
Neutro Abs: 3.4 10*3/uL (ref 1.7–7.7)
Neutrophils Relative %: 66 % (ref 43–77)
Platelets: 463 10*3/uL — ABNORMAL HIGH (ref 150–400)
RDW: 18.9 % — ABNORMAL HIGH (ref 11.5–15.5)
WBC: 5.2 10*3/uL (ref 4.0–10.5)

## 2013-05-21 NOTE — ED Notes (Signed)
NURSE FIRST ROUNDS ; NURSE EXPLAINED DELAY / WAIT TIME AND PROCESS TO PT. , RESPIRATIONS UNLABORED , DENIES PAIN AT THIS TIME .

## 2013-05-21 NOTE — ED Notes (Signed)
Lab has called  About a ?? Pos preg test.  Serum  To be drawn

## 2013-05-21 NOTE — ED Notes (Signed)
The pt is c/o lower abd pain with nv for approx one week.  She has had infrequent bms.  Swelling in her legs also.  lmp last month

## 2013-05-22 ENCOUNTER — Emergency Department (HOSPITAL_COMMUNITY)
Admission: EM | Admit: 2013-05-22 | Discharge: 2013-05-22 | Disposition: A | Discharge status: Home / Self Care | Payer: Medicaid Other | Attending: Emergency Medicine | Admitting: Emergency Medicine

## 2013-05-22 ENCOUNTER — Emergency Department (HOSPITAL_COMMUNITY): Payer: Medicaid Other

## 2013-05-22 DIAGNOSIS — I5023 Acute on chronic systolic (congestive) heart failure: Secondary | ICD-10-CM

## 2013-05-22 DIAGNOSIS — J189 Pneumonia, unspecified organism: Secondary | ICD-10-CM

## 2013-05-22 DIAGNOSIS — K8020 Calculus of gallbladder without cholecystitis without obstruction: Secondary | ICD-10-CM

## 2013-05-22 LAB — PRO B NATRIURETIC PEPTIDE: Pro B Natriuretic peptide (BNP): 5676 pg/mL — ABNORMAL HIGH (ref 0–125)

## 2013-05-22 LAB — LIPASE, BLOOD: Lipase: 21 U/L (ref 11–59)

## 2013-05-22 MED ORDER — MORPHINE SULFATE 4 MG/ML IJ SOLN
4.0000 mg | Freq: Once | INTRAMUSCULAR | Status: AC
  Administered 2013-05-22: 4 mg via INTRAVENOUS
  Filled 2013-05-22: qty 1

## 2013-05-22 MED ORDER — METOCLOPRAMIDE HCL 5 MG/ML IJ SOLN
10.0000 mg | Freq: Once | INTRAMUSCULAR | Status: AC
  Administered 2013-05-22: 10 mg via INTRAVENOUS
  Filled 2013-05-22: qty 2

## 2013-05-22 MED ORDER — ONDANSETRON HCL 4 MG/2ML IJ SOLN
4.0000 mg | Freq: Once | INTRAMUSCULAR | Status: AC
  Administered 2013-05-22: 4 mg via INTRAVENOUS
  Filled 2013-05-22: qty 2

## 2013-05-22 MED ORDER — LEVOFLOXACIN IN D5W 750 MG/150ML IV SOLN
750.0000 mg | Freq: Once | INTRAVENOUS | Status: AC
  Administered 2013-05-22: 750 mg via INTRAVENOUS
  Filled 2013-05-22: qty 150

## 2013-05-22 MED ORDER — LEVOFLOXACIN 750 MG PO TABS: 750.0000 mg | ORAL_TABLET | Freq: Every day | ORAL | Status: DC

## 2013-05-22 MED ORDER — METOCLOPRAMIDE HCL 10 MG PO TABS: 10.0000 mg | ORAL_TABLET | Freq: Four times a day (QID) | ORAL | Status: DC | PRN

## 2013-05-22 NOTE — ED Notes (Signed)
Patient will be discharged home per Dr. Darl Householder

## 2013-05-22 NOTE — ED Notes (Signed)
Patient reports that she wants to go home and not be admitted. EDP notified and at bedside.

## 2013-05-22 NOTE — ED Notes (Signed)
Patient presents with LUQ, epigastric and RUQ pain x 1.5 weeks. Endorses constipation. No diarrhea. LBM 05/21/13. No N/V. Productive cough. No SOB or CP. No hx abdominal surgery. ?gallbladder issues; has appt with GI 6/13.

## 2013-05-22 NOTE — ED Notes (Signed)
MD at bedside. 

## 2013-05-22 NOTE — ED Notes (Signed)
Patient coughing, dry heaving and spitting. Informed EDP.

## 2013-05-22 NOTE — ED Notes (Signed)
Patient back from xray & ultrasound

## 2013-05-22 NOTE — ED Provider Notes (Signed)
History     CSN: [id number]  Arrival date & time 05/21/13  2134   First MD Initiated Contact with Patient 05/22/13 0057      Chief Complaint  Patient presents with  . Abdominal Pain    (Consider location/radiation/quality/duration/timing/severity/associated sxs/prior treatment) The history is provided by the patient.  [name] is a 52 y.o. female history of hypertension, CHF with EF of 35%, nonischemic cardiomyopathy here presenting with abdominal pain and shortness of breath and leg swelling. Symptoms have been intermittent for the last several months. Came in a month ago and was diagnosed with gallstones and chronic CHF and was told to follow up with cardiology and surgery. However patient was unable to see them until later this month. She said for the last month she been having worsening abdominal pain. She also has with shortness of breath and has trouble laying down. She has intermittent leg swelling now has improved.  Denies fevers or chills or cough.    Past Medical History  Diagnosis Date  . Hypertension   . Hypercholesterolemia   . Chronic systolic CHF (congestive heart failure)   . Asthma   . Nonischemic cardiomyopathy     a. 2005 Cath: nonobstructive dzs;  b. 04/2013 Echo: EF 3035%, diff HK, restrictive physiology, mildly dil LA, PASP 4mmHg.  . Microcytic anemia     History reviewed. No pertinent past surgical history.  No family history on file.  History  Substance Use Topics  . Smoking status: Never Smoker   . Smokeless tobacco: Never Used  . Alcohol Use: No    OB History   Grav Para Term Preterm Abortions TAB SAB Ect Mult Living                  Review of Systems  Respiratory: Positive for shortness of breath.   Gastrointestinal: Positive for abdominal pain.  All other systems reviewed and are negative.    Allergies  Review of patient's allergies indicates no known allergies.  Home Medications   Current Outpatient Rx  Name  Route  Sig   Dispense  Refill  . albuterol (PROVENTIL HFA;VENTOLIN HFA) 108 (90 BASE) MCG/ACT inhaler   Inhalation   Inhale 2 puffs into the lungs every 4 (four) hours as needed for wheezing.   1 Inhaler   0   . aspirin EC 81 MG EC tablet   Oral   Take 1 tablet (81 mg total) by mouth daily.         Marland Kitchen atorvastatin (LIPITOR) 20 MG tablet   Oral   Take 1 tablet (20 mg total) by mouth daily at 6 PM.   30 tablet   6   . carvedilol (COREG) 3.125 MG tablet   Oral   Take 1 tablet (3.125 mg total) by mouth 2 (two) times daily with a meal.   60 tablet   6   . ferrous sulfate 325 (65 FE) MG tablet   Oral   Take 325 mg by mouth daily with breakfast.         . furosemide (LASIX) 20 MG tablet   Oral   Take 1 tablet (20 mg total) by mouth daily.   30 tablet   6   . lisinopril (PRINIVIL,ZESTRIL) 10 MG tablet   Oral   Take 1 tablet (10 mg total) by mouth daily.   30 tablet   0   . nitroGLYCERIN (NITROSTAT) 0.4 MG SL tablet   Sublingual   Place 1 tablet (0.4 mg total)  under the tongue every 5 (five) minutes x 3 doses as needed for chest pain.   25 tablet   3   . ondansetron (ZOFRAN) 4 MG tablet   Oral   Take 1 tablet (4 mg total) by mouth every 8 (eight) hours as needed for nausea.   20 tablet   0   . potassium chloride (KLOR-CON) 20 MEQ packet   Oral   Take 20-40 mEq by mouth 2 (two) times daily. Take 2 tablets every morning and take 1 tablet every evening         . promethazine (PHENERGAN) 25 MG tablet   Oral   Take 1 tablet (25 mg total) by mouth every 6 (six) hours as needed for nausea.   12 tablet   0     BP 149/90  Pulse 90  Temp(Src) 97.4 F (36.3 C) (Oral)  Resp 14  SpO2 95%  LMP 04/20/2013  Physical Exam  Nursing note and vitals reviewed. Constitutional: She is oriented to person, place, and time.  Tired, slightly uncomfortable   HENT:  Head: Normocephalic.  Mouth/Throat: Oropharynx is clear and moist.  Eyes: Conjunctivae are normal. Pupils are equal,  round, and reactive to light.  Neck: Normal range of motion. Neck supple.  Cardiovascular: Normal rate, regular rhythm and normal heart sounds.   Pulmonary/Chest: Effort normal.  Decreased breath sounds bilateral bases   Abdominal: Soft.  Distended, mild diffuse tenderness, worse in RUQ. No rebound   Musculoskeletal: Normal range of motion.  1+ edema no calf tenderness   Neurological: She is alert and oriented to person, place, and time.  Skin: Skin is warm and dry.  Psychiatric: She has a normal mood and affect. Her behavior is normal. Judgment and thought content normal.    ED Course  Procedures (including critical care time)  Labs Reviewed  URINALYSIS, ROUTINE W REFLEX MICROSCOPIC - Abnormal; Notable for the following:    Color, Urine AMBER (*)    APPearance CLOUDY (*)    Bilirubin Urine SMALL (*)    Protein, ur 100 (*)    Leukocytes, UA TRACE (*)    All other components within normal limits  PREGNANCY, URINE - Abnormal; Notable for the following:    Preg Test, Ur   (*)    Value: QUESTIONABLE RESULTS, RECOMMEND RECOLLECT TO VERIFY   All other components within normal limits  CBC WITH DIFFERENTIAL - Abnormal; Notable for the following:    Hemoglobin 11.3 (*)    HCT 34.6 (*)    MCV 77.8 (*)    MCH 25.4 (*)    RDW 18.9 (*)    Platelets 463 (*)    All other components within normal limits  COMPREHENSIVE METABOLIC PANEL - Abnormal; Notable for the following:    Potassium 3.3 (*)    Glucose, Bld 125 (*)    Albumin 3.2 (*)    Alkaline Phosphatase 136 (*)    GFR calc non Af Amer 68 (*)    GFR calc Af Amer 79 (*)    All other components within normal limits  URINE MICROSCOPIC-ADD ON - Abnormal; Notable for the following:    Squamous Epithelial / LPF MANY (*)    Bacteria, UA MANY (*)    Casts GRANULAR CAST (*)    All other components within normal limits  PRO B NATRIURETIC PEPTIDE - Abnormal; Notable for the following:    Pro B Natriuretic peptide (BNP) 5676.0 (*)    All  other components within normal limits  HCG, SERUM, QUALITATIVE  LIPASE, BLOOD   US Abdomen Complete  05/22/2013   *RADIOLOGY REPORT*  Clinical Data:  Upper abdominal pain.  COMPLETE ABDOMINAL ULTRASOUND  Comparison:  04/24/2013.  Findings:  Gallbladder:  Gallbladder is nearly completely collapsed, with a normal wall thickness.  Trace amount of pericholecystic fluid, similar to the prior examination.  Per report from the sonographer, the patient did not exhibit a sonographic Murphy's sign on examination.  No gallstones.  Common bile duct:  Normal caliber measuring 2.9 mm in the porta hepatis.  Liver:  No focal mass lesion seen.  Within normal limits in parenchymal echogenicity.  IVC:  Patent throughout its visualized course in the abdomen.  Pancreas:  Although the pancreas is difficult to visualize in its entirety, no focal pancreatic abnormality is identified.  Spleen:  Normal size and echotexture without focal parenchymal abnormality.8.1 cm in length.  Right Kidney:  No hydronephrosis.  Well-preserved cortex.  Normal size and parenchymal echotexture without focal abnormalities. 11.0 cm in length.  Left Kidney:  No hydronephrosis.  Well-preserved cortex.  Normal size and parenchymal echotexture without focal abnormalities. 10.3 cm in length.  Abdominal aorta:  Normal caliber measuring 2 cm in diameter proximally, tapering appropriately distally.  IMPRESSION: 1.  Trace amount of pericholecystic fluid, similar to the prior examination.  As there are no gallstones, the gallbladder does not appear distended, gallbladder wall thickness is normal, and there was no sonographic Murphy's sign, findings are not favored to represent an acute cholecystitis. 2.  The examination is otherwise normal.   Original Report Authenticated By: Vinnie Langton, M.D.   Dg Abd Acute W/chest  05/22/2013   *RADIOLOGY REPORT*  Clinical Data: Abdominal pain.  Shortness of breath.  ACUTE ABDOMEN SERIES (ABDOMEN 2 VIEW & CHEST 1 VIEW)   Comparison: Chest x-ray 04/24/2013.  Findings: Ill-defined peribronchovascular opacities in the right mid to lower lung, favored to reflects mild right lower lobe bronchopneumonia.  No pleural effusions.  Pulmonary vasculature is within normal limits.  Mild cardiomegaly is unchanged.  Upper mediastinal contours are unremarkable.  Gas and stool are seen scattered throughout the colon extending to the level of the distal rectum.  No pathologic distension of small bowel is noted.  No gross evidence of pneumoperitoneum.  Coarse calcifications in the central pelvis likely represent calcified fibroid.  IMPRESSION: 1.  Nonobstructive bowel gas pattern. 2.  No pneumoperitoneum. 3.  Probable bronchopneumonia in the right lower lobe. 4.  Mild cardiomegaly.   Original Report Authenticated By: Vinnie Langton, M.D.     No diagnosis found.    MDM  Rodessa Engberg is a 52 y.o. female here with ab pain, SOB. Ab pain worse in RUQ. ? Cholecystitis in previous US so will repeat. Will need to r/o worsening CHF given SOB and hx of CHF. Will get BNP and reassess.   4 AM CXR showed possible pneumonia. BNP slightly elevated but around baseline. RUQ US showed gallstones but no acute chole. Patient given levaquin. I offered admission but patient wants to go home and finish abx at home. I told her to f/u with her cardiologist as she may need more lasix after pneumonia resolved. She should also f/u with surgery.          Wandra Arthurs, MD 05/22/13 [phone]

## 2013-05-22 NOTE — ED Notes (Signed)
Patient transported to X-ray then Ultrasound

## 2013-05-22 NOTE — ED Notes (Signed)
Main lab notified to add-on lipase

## 2013-05-24 NOTE — ED Provider Notes (Signed)
Patient was originally prescribed Levaquin by Dr. Allie Bossier for CAP- pharmacy called today, 05/24/2013, stating that medication was not covered by insurance. Stated that insurance cover [facility]. Discussed case with Dr. Lita Mains, approved change in medication for patient. Change in medication made to [facility].    Jamse Mead, PA-C 05/24/13 1729

## 2013-05-25 NOTE — ED Provider Notes (Signed)
Medical screening examination/treatment/procedure(s) were performed by non-physician practitioner and as supervising physician I was immediately available for consultation/collaboration.   Julianne Rice, MD 05/25/13 [phone]

## 2013-05-31 ENCOUNTER — Emergency Department (HOSPITAL_COMMUNITY)
Admission: EM | Admit: 2013-05-31 | Discharge: 2013-06-01 | Disposition: A | Discharge status: Home / Self Care | Payer: Medicaid Other | Attending: Emergency Medicine | Admitting: Emergency Medicine

## 2013-05-31 ENCOUNTER — Emergency Department (HOSPITAL_COMMUNITY): Payer: Medicaid Other

## 2013-05-31 ENCOUNTER — Ambulatory Visit (INDEPENDENT_AMBULATORY_CARE_PROVIDER_SITE_OTHER): Payer: Self-pay | Admitting: General Surgery

## 2013-05-31 ENCOUNTER — Encounter (INDEPENDENT_AMBULATORY_CARE_PROVIDER_SITE_OTHER): Payer: Self-pay | Admitting: General Surgery

## 2013-05-31 ENCOUNTER — Encounter (HOSPITAL_COMMUNITY): Payer: Self-pay | Admitting: Emergency Medicine

## 2013-05-31 VITALS — BP 158/110 | HR 98 | Temp 98.9°F | Resp 24 | Ht 61.5 in | Wt 153.6 lb

## 2013-05-31 DIAGNOSIS — N390 Urinary tract infection, site not specified: Secondary | ICD-10-CM

## 2013-05-31 DIAGNOSIS — R300 Dysuria: Secondary | ICD-10-CM | POA: Insufficient documentation

## 2013-05-31 DIAGNOSIS — Z7982 Long term (current) use of aspirin: Secondary | ICD-10-CM | POA: Insufficient documentation

## 2013-05-31 DIAGNOSIS — R109 Unspecified abdominal pain: Secondary | ICD-10-CM

## 2013-05-31 DIAGNOSIS — I5022 Chronic systolic (congestive) heart failure: Secondary | ICD-10-CM | POA: Insufficient documentation

## 2013-05-31 DIAGNOSIS — J45909 Unspecified asthma, uncomplicated: Secondary | ICD-10-CM | POA: Insufficient documentation

## 2013-05-31 DIAGNOSIS — R1013 Epigastric pain: Secondary | ICD-10-CM

## 2013-05-31 DIAGNOSIS — D509 Iron deficiency anemia, unspecified: Secondary | ICD-10-CM | POA: Insufficient documentation

## 2013-05-31 DIAGNOSIS — K921 Melena: Secondary | ICD-10-CM | POA: Insufficient documentation

## 2013-05-31 DIAGNOSIS — Z8679 Personal history of other diseases of the circulatory system: Secondary | ICD-10-CM | POA: Insufficient documentation

## 2013-05-31 DIAGNOSIS — Z79899 Other long term (current) drug therapy: Secondary | ICD-10-CM | POA: Insufficient documentation

## 2013-05-31 DIAGNOSIS — E785 Hyperlipidemia, unspecified: Secondary | ICD-10-CM | POA: Insufficient documentation

## 2013-05-31 DIAGNOSIS — I10 Essential (primary) hypertension: Secondary | ICD-10-CM | POA: Insufficient documentation

## 2013-05-31 LAB — COMPREHENSIVE METABOLIC PANEL
ALT: 23 U/L (ref 0–35)
AST: 27 U/L (ref 0–37)
Albumin: 3.3 g/dL — ABNORMAL LOW (ref 3.5–5.2)
Alkaline Phosphatase: 128 U/L — ABNORMAL HIGH (ref 39–117)
Chloride: 106 mEq/L (ref 96–112)
Potassium: 4.3 mEq/L (ref 3.5–5.1)
Total Bilirubin: 1.7 mg/dL — ABNORMAL HIGH (ref 0.3–1.2)

## 2013-05-31 LAB — URINALYSIS, ROUTINE W REFLEX MICROSCOPIC
Glucose, UA: NEGATIVE mg/dL
Ketones, ur: NEGATIVE mg/dL
pH: 5 (ref 5.0–8.0)

## 2013-05-31 LAB — URINE MICROSCOPIC-ADD ON

## 2013-05-31 LAB — CBC WITH DIFFERENTIAL/PLATELET
Basophils Absolute: 0.1 10*3/uL (ref 0.0–0.1)
Basophils Relative: 1 % (ref 0–1)
Hemoglobin: 12.5 g/dL (ref 12.0–15.0)
MCHC: 32.5 g/dL (ref 30.0–36.0)
Monocytes Relative: 9 % (ref 3–12)
Neutro Abs: 2.6 10*3/uL (ref 1.7–7.7)
Neutrophils Relative %: 55 % (ref 43–77)
RDW: 20 % — ABNORMAL HIGH (ref 11.5–15.5)

## 2013-05-31 MED ORDER — HYDROCODONE-ACETAMINOPHEN 5-325 MG PO TABS: 1.0000 | ORAL_TABLET | ORAL | Status: DC | PRN

## 2013-05-31 MED ORDER — IOHEXOL 300 MG/ML  SOLN
25.0000 mL | Freq: Once | INTRAMUSCULAR | Status: AC | PRN
  Administered 2013-05-31: 25 mL via ORAL

## 2013-05-31 MED ORDER — CIPROFLOXACIN HCL 500 MG PO TABS: 500.0000 mg | ORAL_TABLET | Freq: Two times a day (BID) | ORAL | Status: DC

## 2013-05-31 MED ORDER — PANTOPRAZOLE SODIUM 40 MG PO TBEC: 40.0000 mg | DELAYED_RELEASE_TABLET | Freq: Every day | ORAL | Status: DC

## 2013-05-31 MED ORDER — MORPHINE SULFATE 4 MG/ML IJ SOLN
4.0000 mg | Freq: Once | INTRAMUSCULAR | Status: DC
  Filled 2013-05-31: qty 1

## 2013-05-31 MED ORDER — IOHEXOL 300 MG/ML  SOLN
100.0000 mL | Freq: Once | INTRAMUSCULAR | Status: AC | PRN
  Administered 2013-05-31: 100 mL via INTRAVENOUS

## 2013-05-31 MED ORDER — ALBUTEROL SULFATE HFA 108 (90 BASE) MCG/ACT IN AERS: 1.0000 | INHALATION_SPRAY | Freq: Four times a day (QID) | RESPIRATORY_TRACT | Status: DC | PRN

## 2013-05-31 NOTE — ED Notes (Signed)
Pt notified of plan of care and need to start IV. Pt verifies that IV is needed for CT scan. NAD noted. Pt resting in bed with family at bedside.

## 2013-05-31 NOTE — ED Notes (Signed)
Pt states that she was told to see a surgeon about her gallbladder. Pt states that she went to see the surgeon who told her that she was mis-diagnosed. Pt was given referral to a specialist and was told to follow up. Pt states that the pain became worse this evening and her family felt she needed to been seen emergently. Pt has dyspnea with exertion. Pt denies chest pain. Pt states she was diagnosed with pneumonia earlier this month. Pt states she has intermittent nausea with some difficulty having a BM. NAD noted. Family at bedside

## 2013-05-31 NOTE — ED Notes (Signed)
Pt finished with contrast, CT notified. 

## 2013-05-31 NOTE — ED Provider Notes (Signed)
History     CSN: [id number]  Arrival date & time 05/31/13  Vernelle Emerald   First MD Initiated Contact with Patient 05/31/13 2046      Chief Complaint  Patient presents with  . Abdominal Pain    (Consider location/radiation/quality/duration/timing/severity/associated sxs/prior treatment) HPI  Patient is a 52 yo F PMHx significant for CHF, HTN presenting to the ED for waxing and waning burning upper abdominal pain x 1-2 weeks. Pt was evaluted in the ED on 05/22/13 and sent to general surgeon for further assessment of RUQ pain for questionable gallbladder etiology. Patient saw Dr. Lilyan Punt yesterday at which time he did not feel the patient had a surgical cause for her abdominal pain and started patient on Protonix for possible PUD. Patient was also given referral to GI for possible endoscopy. Patient developed worsening upper abdominal pain last night w/o relief from at home symptomatic care. Aggravating factors include palpation. Patient rates pain 8/10 without radiation. Patient denies any nausea, vomiting, diarrhea, constipation, CP, SOB.   Past Medical History  Diagnosis Date  . Hypertension   . Hypercholesterolemia   . Chronic systolic CHF (congestive heart failure)   . Asthma   . Nonischemic cardiomyopathy     a. 2005 Cath: nonobstructive dzs;  b. 04/2013 Echo: EF 3035%, diff HK, restrictive physiology, mildly dil LA, PASP 23mmHg.  . Microcytic anemia     History reviewed. No pertinent past surgical history.  Family History  Problem Relation Age of Onset  . Heart disease Mother   . Heart disease Sister   . Heart disease Brother     History  Substance Use Topics  . Smoking status: Never Smoker   . Smokeless tobacco: Never Used  . Alcohol Use: No    OB History   Grav Para Term Preterm Abortions TAB SAB Ect Mult Living                  Review of Systems  Constitutional: Negative for fever and chills.  Respiratory: Negative for shortness of breath.   Cardiovascular: Negative  for chest pain.  Gastrointestinal: Positive for abdominal pain and blood in stool. Negative for nausea, vomiting, diarrhea, constipation and rectal pain.  Genitourinary: Positive for dysuria.  All other systems reviewed and are negative.    Allergies  Review of patient's allergies indicates no known allergies.  Home Medications   Current Outpatient Rx  Name  Route  Sig  Dispense  Refill  . albuterol (PROVENTIL HFA;VENTOLIN HFA) 108 (90 BASE) MCG/ACT inhaler   Inhalation   Inhale 2 puffs into the lungs every 4 (four) hours as needed for wheezing or shortness of breath.         Marland Kitchen aspirin EC 81 MG EC tablet   Oral   Take 1 tablet (81 mg total) by mouth daily.         Marland Kitchen atorvastatin (LIPITOR) 20 MG tablet   Oral   Take 1 tablet (20 mg total) by mouth daily at 6 PM.   30 tablet   6   . carvedilol (COREG) 3.125 MG tablet   Oral   Take 1 tablet (3.125 mg total) by mouth 2 (two) times daily with a meal.   60 tablet   6   . ferrous sulfate 325 (65 FE) MG tablet   Oral   Take 325 mg by mouth daily with breakfast.         . furosemide (LASIX) 20 MG tablet   Oral   Take 1 tablet (  20 mg total) by mouth daily.   30 tablet   6   . lisinopril (PRINIVIL,ZESTRIL) 10 MG tablet   Oral   Take 1 tablet (10 mg total) by mouth daily.   30 tablet   0   . metoCLOPramide (REGLAN) 10 MG tablet   Oral   Take 1 tablet (10 mg total) by mouth every 6 (six) hours as needed (nausea/headache).   10 tablet   0   . nitroGLYCERIN (NITROSTAT) 0.4 MG SL tablet   Sublingual   Place 0.4 mg under the tongue every 5 (five) minutes as needed for chest pain.         Marland Kitchen ondansetron (ZOFRAN) 4 MG tablet   Oral   Take 1 tablet (4 mg total) by mouth every 8 (eight) hours as needed for nausea.   20 tablet   0   . pantoprazole (PROTONIX) 40 MG tablet   Oral   Take 1 tablet (40 mg total) by mouth daily.   30 tablet   1   . potassium chloride (KLOR-CON) 20 MEQ packet   Oral   Take 20-40  mEq by mouth 2 (two) times daily. Take 2 tablets every morning and take 1 tablet every evening         . promethazine (PHENERGAN) 25 MG tablet   Oral   Take 1 tablet (25 mg total) by mouth every 6 (six) hours as needed for nausea.   12 tablet   0   . albuterol (PROVENTIL HFA;VENTOLIN HFA) 108 (90 BASE) MCG/ACT inhaler   Inhalation   Inhale 1-2 puffs into the lungs every 6 (six) hours as needed for wheezing.   1 Inhaler   0   . ciprofloxacin (CIPRO) 500 MG tablet   Oral   Take 1 tablet (500 mg total) by mouth 2 (two) times daily.   6 tablet   0   . HYDROcodone-acetaminophen (NORCO/VICODIN) 5-325 MG per tablet   Oral   Take 1 tablet by mouth every 4 (four) hours as needed for pain.   12 tablet   0     BP 164/104  Pulse 58  Temp(Src) 97.6 F (36.4 C) (Oral)  Resp 20  SpO2 96%  LMP 04/20/2013  Physical Exam  Constitutional: She is oriented to person, place, and time. She appears well-developed and well-nourished.  HENT:  Head: Normocephalic and atraumatic.  Eyes: EOM are normal. Pupils are equal, round, and reactive to light.  Cardiovascular: Normal rate, regular rhythm and normal heart sounds.   Pulmonary/Chest: Effort normal and breath sounds normal.  Abdominal: Soft. Bowel sounds are normal. She exhibits no distension. There is tenderness in the right upper quadrant, epigastric area and left upper quadrant. There is no rigidity, no rebound, no guarding, no CVA tenderness and negative Murphy's sign.  Neurological: She is alert and oriented to person, place, and time.  Skin: Skin is warm and dry.  Psychiatric: She has a normal mood and affect.    ED Course  Procedures (including critical care time)  Labs Reviewed  CBC WITH DIFFERENTIAL - Abnormal; Notable for the following:    MCH 25.6 (*)    RDW 20.0 (*)    All other components within normal limits  COMPREHENSIVE METABOLIC PANEL - Abnormal; Notable for the following:    Glucose, Bld 166 (*)    Albumin 3.3  (*)    Alkaline Phosphatase 128 (*)    Total Bilirubin 1.7 (*)    GFR calc non Af Amer 68 (*)  GFR calc Af Amer 79 (*)    All other components within normal limits  URINALYSIS, ROUTINE W REFLEX MICROSCOPIC - Abnormal; Notable for the following:    Color, Urine AMBER (*)    APPearance HAZY (*)    Hgb urine dipstick SMALL (*)    Bilirubin Urine MODERATE (*)    Protein, ur 100 (*)    Leukocytes, UA TRACE (*)    All other components within normal limits  URINE MICROSCOPIC-ADD ON - Abnormal; Notable for the following:    Squamous Epithelial / LPF MANY (*)    Bacteria, UA FEW (*)    Casts HYALINE CASTS (*)    All other components within normal limits  URINE CULTURE  LIPASE, BLOOD  OCCULT BLOOD X 1 CARD TO LAB, STOOL  POCT I-STAT TROPONIN I  OCCULT BLOOD, POC DEVICE   Ct Abdomen Pelvis W Contrast  05/31/2013   *RADIOLOGY REPORT*  Clinical Data: Abdominal pain.  CT ABDOMEN AND PELVIS WITH CONTRAST  Technique:  Multidetector CT imaging of the abdomen and pelvis was performed following the standard protocol during bolus administration of intravenous contrast.  Contrast: 143mL OMNIPAQUE IOHEXOL 300 MG/ML  SOLN  Comparison: Radiographs dated 05/22/2013 and abdomen ultrasound dated 04/24/2013.  Findings: Prominent interstitial markings at the lung bases.  Small to moderate-sized right pleural effusion and minimal left pleural effusion.  Mildly enlarged heart.  Initial mild diffuse low density of the liver.  On the delayed images through the kidneys, the liver is diffusely heterogeneous with no discrete mass visualized.  Small amount of free peritoneal fluid adjacent to the inferior right lobe of the liver and in the pelvis.  The left lobe of the liver is prominent and the caudate lobe is mildly prominent.  Normal appearing spleen, pancreas, gallbladder, adrenal glands, kidneys and urinary bladder.  3.2 cm right posterior the uterine calcified fibroid.  Normal appearing ovaries.  No gastrointestinal  abnormalities or enlarged lymph nodes.  Mild lower thoracic spine degenerative changes.  IMPRESSION:  1.  Findings suggesting cirrhosis or hepatitis of the liver. Another possibility for the heterogeneity of the liver is right heart failure. 2.  Findings in the chest compatible with congestive heart failure, including interstitial pulmonary edema, mild cardiomegaly, small to moderate-sized right pleural effusion and minimal left pleural effusion. 3.  3.2 cm calcified uterine fibroid.   Original Report Authenticated By: Claudie Revering, M.D.     1. Abdominal pain   2. UTI (urinary tract infection)       MDM  Patient is nontoxic, nonseptic appearing, in no apparent distress.  Patient's pain and other symptoms adequately managed in emergency department.  Labs, imaging and vitals reviewed.  Patient does not meet the SIRS or Sepsis criteria.  On repeat exam patient does not have a surgical abdomen and there are nor peritoneal signs.  No indication of appendicitis, bowel obstruction, bowel perforation, cholecystitis, diverticulitis.  Patient discharged home with symptomatic treatment and given strict instructions for follow-up with their primary care physician.  I have also discussed reasons to return immediately to the ER.  Patient advised to continue Protonix and keep f/u with GI. Patient expresses understanding and agrees with plan. Patient d/w with Dr. Winfred Leeds, agrees with plan. Patient is stable at time of discharge.             Harlow Mares, PA-C 05/31/13 2349

## 2013-05-31 NOTE — ED Notes (Signed)
Pt reports being told she needed to have gallbladder removed, met with surgeon today and was told that she was misdiagnosed and told to come back here further eval of upper bilateral abd pain, reports recent black stools. No acute distress noted at triage.

## 2013-05-31 NOTE — Progress Notes (Signed)
Patient ID: [facility], female   DOB: [birth year], 52 y.o.   MRN: [id number]  Chief Complaint  Patient presents with  . Cholelithiasis    new pt- eval GB    HPI [name] is a 52 y.o. female.  This patient comes in for evaluation of upper abdominal discomfort. She is cyst that she is upper abdominal discomfort which she describes as scratchy and burning and she says this is brought on by pressure in the area. She has to lay down a certain way to avoid putting pressure on the area of and she says that she can't even wear bra because of the discomfort. She also has a hard time taking down no conduced because it causes coughing and says that she can eat as much as she could previously. She says that she's been coughing at night and having some nighttime fevers but she hasn't measured temperatures. She denies any pain with eating or any food association. She says that she is normally constipated and has noticed some black stools in the past. She does have some nausea and reflux but has not taken any reflux medication. She does have a history of congestive heart failure as well and has been recently seen in Hospital for evaluation of this. HPI  Past Medical History  Diagnosis Date  . Hypertension   . Hypercholesterolemia   . Chronic systolic CHF (congestive heart failure)   . Asthma   . Nonischemic cardiomyopathy     a. 2005 Cath: nonobstructive dzs;  b. 04/2013 Echo: EF 3035%, diff HK, restrictive physiology, mildly dil LA, PASP 5mmHg.  . Microcytic anemia     History reviewed. No pertinent past surgical history.  Family History  Problem Relation Age of Onset  . Heart disease Mother   . Heart disease Sister   . Heart disease Brother     Social History History  Substance Use Topics  . Smoking status: Never Smoker   . Smokeless tobacco: Never Used  . Alcohol Use: No    No Known Allergies  Current Outpatient Prescriptions  Medication Sig Dispense Refill  . albuterol  (PROVENTIL HFA;VENTOLIN HFA) 108 (90 BASE) MCG/ACT inhaler Inhale 2 puffs into the lungs every 4 (four) hours as needed for wheezing.  1 Inhaler  0  . aspirin EC 81 MG EC tablet Take 1 tablet (81 mg total) by mouth daily.      Marland Kitchen atorvastatin (LIPITOR) 20 MG tablet Take 1 tablet (20 mg total) by mouth daily at 6 PM.  30 tablet  6  . carvedilol (COREG) 3.125 MG tablet Take 1 tablet (3.125 mg total) by mouth 2 (two) times daily with a meal.  60 tablet  6  . ferrous sulfate 325 (65 FE) MG tablet Take 325 mg by mouth daily with breakfast.      . furosemide (LASIX) 20 MG tablet Take 1 tablet (20 mg total) by mouth daily.  30 tablet  6  . levofloxacin (LEVAQUIN) 750 MG tablet Take 1 tablet (750 mg total) by mouth daily. X 7 days  7 tablet  0  . lisinopril (PRINIVIL,ZESTRIL) 10 MG tablet Take 1 tablet (10 mg total) by mouth daily.  30 tablet  0  . metoCLOPramide (REGLAN) 10 MG tablet Take 1 tablet (10 mg total) by mouth every 6 (six) hours as needed (nausea/headache).  10 tablet  0  . nitroGLYCERIN (NITROSTAT) 0.4 MG SL tablet Place 1 tablet (0.4 mg total) under the tongue every 5 (five) minutes x 3 doses  as needed for chest pain.  25 tablet  3  . ondansetron (ZOFRAN) 4 MG tablet Take 1 tablet (4 mg total) by mouth every 8 (eight) hours as needed for nausea.  20 tablet  0  . potassium chloride (KLOR-CON) 20 MEQ packet Take 20-40 mEq by mouth 2 (two) times daily. Take 2 tablets every morning and take 1 tablet every evening      . promethazine (PHENERGAN) 25 MG tablet Take 1 tablet (25 mg total) by mouth every 6 (six) hours as needed for nausea.  12 tablet  0  . pantoprazole (PROTONIX) 40 MG tablet Take 1 tablet (40 mg total) by mouth daily.  30 tablet  1   No current facility-administered medications for this visit.    Review of Systems Review of Systems All other review of systems negative or noncontributory except as stated in the HPI   Blood pressure 158/110, pulse 98, temperature 98.9 F (37.2 C),  temperature source Temporal, resp. rate 24, height 5' 1.5" (1.562 m), weight 153 lb 9.6 oz (69.673 kg), last menstrual period 04/20/2013.  Physical Exam Physical Exam Physical Exam  Nursing note and vitals reviewed. Constitutional: She is oriented to person, place, and time. She appears well-developed and well-nourished. No distress.  HENT:  Head: Normocephalic and atraumatic.  Mouth/Throat: No oropharyngeal exudate.  Eyes: Conjunctivae and EOM are normal. Pupils are equal, round, and reactive to light. Right eye exhibits no discharge. Left eye exhibits no discharge. No scleral icterus.  Neck: Normal range of motion. Neck supple. No tracheal deviation present.  Cardiovascular: Normal rate, regular rhythm, normal heart sounds and intact distal pulses.   Pulmonary/Chest: Effort normal and breath sounds normal. No stridor. No respiratory distress. She has no wheezes.  Abdominal: Soft. Bowel sounds are normal. She exhibits no distension and no mass. There is no obvious tenderness. There is no rebound and no guarding.  Musculoskeletal: Normal range of motion. She exhibits no edema and no tenderness.  Neurological: She is alert and oriented to person, place, and time.  Skin: Skin is warm and dry. No rash noted. She is not diaphoretic. No erythema. No pallor.  Psychiatric: She has a normal mood and affect. Her behavior is normal. Judgment and thought content normal.    Data Reviewed [place]  Assessment    Abdominal pain and nausea of uncertain etiology I really am not sure that this is related to her gallbladder. She has had 2 recent ultrasound which did not demonstrate any cholelithiasis but did demonstrate some gallbladder wall thickening. Her symptoms are not back consistent with gallbladder type symptoms as well. I really not sure what is causing her symptoms she does have very atypical complaints with the bilateral pressure to palpation and the coughing. Given her black stools and history of  reflux I have recommended a trial of PPIs and prescribe some Protonix daily for this. I have also recommended a gastroenterology referral and upper endoscopy for evaluation of the melena. Otherwise I am not really sure what else I can offer her. I do not think that we have enough evidence to justify gallbladder surgery at this time especially given her significant coronary problems and history of CHF. Have also recommended that she followup with her primary care physician to further work this up for possible source of her symptoms.    Plan    Protonix daily Gastrology referral for upper endoscopy and further workup of her abdominal discomfort        Josslin Sanjuan DAVID 05/31/2013,  12:23 PM

## 2013-05-31 NOTE — ED Notes (Signed)
Pt offered pain medication and refused at this time. Pt states that she just wants to try drinking the contrast and will call out if pain medication is needed/wanted.

## 2013-05-31 NOTE — ED Notes (Signed)
Arther Dames, PA at bedside with occult card

## 2013-06-01 NOTE — ED Provider Notes (Signed)
Medical screening examination/treatment/procedure(s) were conducted as a shared visit with non-physician practitioner(s) and myself.  I personally evaluated the patient during the encounter  Orlie Dakin, MD 06/01/13 [phone]

## 2013-06-01 NOTE — ED Provider Notes (Signed)
Complains of epigastric pain intermittent for 2 months. Worse with eating. Pain unchanged today. Patient was seen by surgeons then referred to gastroenterologist. Presently patient is alert no distress  Teresa Dakin, MD 06/01/13 [id number]

## 2013-06-02 LAB — URINE CULTURE

## 2013-06-07 ENCOUNTER — Other Ambulatory Visit: Payer: Self-pay | Admitting: Cardiovascular Disease

## 2013-06-07 ENCOUNTER — Ambulatory Visit (INDEPENDENT_AMBULATORY_CARE_PROVIDER_SITE_OTHER): Payer: Self-pay | Admitting: Cardiovascular Disease

## 2013-06-07 ENCOUNTER — Encounter: Payer: Self-pay | Admitting: Cardiovascular Disease

## 2013-06-07 ENCOUNTER — Encounter: Payer: Self-pay | Admitting: *Deleted

## 2013-06-07 VITALS — BP 154/96 | HR 97 | Ht 61.0 in | Wt 152.0 lb

## 2013-06-07 DIAGNOSIS — I5022 Chronic systolic (congestive) heart failure: Secondary | ICD-10-CM

## 2013-06-07 DIAGNOSIS — Z01810 Encounter for preprocedural cardiovascular examination: Secondary | ICD-10-CM

## 2013-06-07 DIAGNOSIS — E7800 Pure hypercholesterolemia, unspecified: Secondary | ICD-10-CM

## 2013-06-07 DIAGNOSIS — R609 Edema, unspecified: Secondary | ICD-10-CM

## 2013-06-07 DIAGNOSIS — I428 Other cardiomyopathies: Secondary | ICD-10-CM

## 2013-06-07 DIAGNOSIS — R188 Other ascites: Secondary | ICD-10-CM

## 2013-06-07 DIAGNOSIS — I10 Essential (primary) hypertension: Secondary | ICD-10-CM

## 2013-06-07 LAB — CBC WITH DIFFERENTIAL/PLATELET
Basophils Relative: 0.6 % (ref 0.0–3.0)
Eosinophils Absolute: 0 10*3/uL (ref 0.0–0.7)
Eosinophils Relative: 0.7 % (ref 0.0–5.0)
Hemoglobin: 12.2 g/dL (ref 12.0–15.0)
Lymphocytes Relative: 34.1 % (ref 12.0–46.0)
MCHC: 31.7 g/dL (ref 30.0–36.0)
Neutro Abs: 2.5 10*3/uL (ref 1.4–7.7)
RBC: 4.63 Mil/uL (ref 3.87–5.11)
WBC: 4.3 10*3/uL — ABNORMAL LOW (ref 4.5–10.5)

## 2013-06-07 LAB — BASIC METABOLIC PANEL
BUN: 9 mg/dL (ref 6–23)
CO2: 22 mEq/L (ref 19–32)
Chloride: 110 mEq/L (ref 96–112)
Creatinine, Ser: 1.1 mg/dL (ref 0.4–1.2)
Glucose, Bld: 137 mg/dL — ABNORMAL HIGH (ref 70–99)

## 2013-06-07 LAB — BRAIN NATRIURETIC PEPTIDE: Pro B Natriuretic peptide (BNP): 1898 pg/mL — ABNORMAL HIGH (ref 0.0–100.0)

## 2013-06-07 MED ORDER — FUROSEMIDE 20 MG PO TABS: 20.0000 mg | ORAL_TABLET | Freq: Two times a day (BID) | ORAL | Status: DC

## 2013-06-07 MED ORDER — SPIRONOLACTONE 12.5 MG HALF TABLET: 12.5000 mg | ORAL_TABLET | Freq: Every day | ORAL | Status: DC

## 2013-06-07 NOTE — Assessment & Plan Note (Signed)
Well controlled.  Continue current medications and low sodium Dash type diet.    

## 2013-06-07 NOTE — Assessment & Plan Note (Signed)
Not clear to me but I suspect ascnites is cardiogenic in origin and she needs more diuresis.  Will arrange right heart cath for next week and increase diuretics.  Supsequently will need referral to CHF clinic

## 2013-06-07 NOTE — Assessment & Plan Note (Signed)
Referral to GI never made by surgeon last week  Agree that GB disease is not the issue.  Suspect cardiac cirrhosis but needs GI to work up liver disease

## 2013-06-07 NOTE — Progress Notes (Signed)
Patient ID: [facility], female   DOB: 1961-03-12, 52 y.o.   MRN: [id number] [name] is a 52 y.o. female with hx below relevant for idiopathic dilated cardiomyopathy, recent admission for decompensated CHF 4/14  who returned to the ED this month  with progressive epigastric discomfort. Patient reports that she has had these symptoms for several weeks but they have progressed over the last several days since she was discharged. Pain is described as burning and sometimes crampy "like a ball", located in her epigastric region. Episodic without apparent triggers. Sometimes a/w nausea and emesis. Reproducible. In ED, her evaluation was notable for a mildly elevated troponin that is essentially unchanged from her recent admission. She has been very compliant with her new medication program.   Previous diagnostic testing for coronary artery disease includes: cardiac catheterization and echocardiogram.  Previous history of cardiac disease includes Cardiomyopathy  CHF  Coronary Artery Disease. Coronary artery disease risk factors include: dyslipidemia, hypertension and sedentary lifestyle. Patient denies history of coronary angioplasty, coronary artery stent and previous M.I..  Last cath 2005 [facility] with no CAD and PA pressures in mid 40's  1. Pulmonary hypertension with pulmonary artery pressure of 49/23.  2. Mild to moderate coronary disease, medical treatment only. No high  grade stenoses.  3. Left ventricular dilatation with global hypokinesis and a calculated  ejection fraction of 30%. This is disproportionately low compared to  the mild coronary disease. It appears to be an idiopathic  cardiomyopathy and will be best managed with medical therapy.  CT  Abdomen 6/13 showed ? cirhosis IMPRESSION:  1. Findings suggesting cirrhosis or hepatitis of the liver. Another possibility for the heterogeneity of the liver is right heart failure. 2. Findings in the chest compatible with congestive  heart failure, including interstitial pulmonary edema, mild cardiomegaly, small to moderate-sized right pleural effusion and minimal left pleural effusion. 3. 3.2 cm calcified uterine fibroid.  ROS: Denies fever, malais, weight loss, blurry vision, decreased visual acuity, cough, sputum, SOB, hemoptysis, pleuritic pain, palpitaitons, heartburn, abdominal pain, melena, lower extremity edema, claudication, or rash.  All other systems reviewed and negative  General: Affect appropriate Healthy:  appears stated age 57: normal Neck supple with no adenopathy JVP normal no bruits no thyromegaly Lungs clear with no wheezing and good diaphragmatic motion Heart:  S1/S2 no murmur, no rub, gallop or click PMI normal Abdomen: benighn, BS positve, no tenderness, no AAA no bruit.  No HSM or HJR Distal pulses intact with no bruits No edema Neuro non-focal Skin warm and dry No muscular weakness   Current Outpatient Prescriptions  Medication Sig Dispense Refill  . albuterol (PROVENTIL HFA;VENTOLIN HFA) 108 (90 BASE) MCG/ACT inhaler Inhale 2 puffs into the lungs every 4 (four) hours as needed for wheezing or shortness of breath.      Marland Kitchen albuterol (PROVENTIL HFA;VENTOLIN HFA) 108 (90 BASE) MCG/ACT inhaler Inhale 1-2 puffs into the lungs every 6 (six) hours as needed for wheezing.  1 Inhaler  0  . aspirin EC 81 MG EC tablet Take 1 tablet (81 mg total) by mouth daily.      Marland Kitchen atorvastatin (LIPITOR) 20 MG tablet Take 1 tablet (20 mg total) by mouth daily at 6 PM.  30 tablet  6  . carvedilol (COREG) 3.125 MG tablet Take 1 tablet (3.125 mg total) by mouth 2 (two) times daily with a meal.  60 tablet  6  . ciprofloxacin (CIPRO) 500 MG tablet Take 1 tablet (500 mg total) by mouth 2 (two)  times daily.  6 tablet  0  . ferrous sulfate 325 (65 FE) MG tablet Take 325 mg by mouth daily with breakfast.      . furosemide (LASIX) 20 MG tablet Take 1 tablet (20 mg total) by mouth daily.  30 tablet  6  .  HYDROcodone-acetaminophen (NORCO/VICODIN) 5-325 MG per tablet Take 1 tablet by mouth every 4 (four) hours as needed for pain.  12 tablet  0  . lisinopril (PRINIVIL,ZESTRIL) 10 MG tablet Take 1 tablet (10 mg total) by mouth daily.  30 tablet  0  . metoCLOPramide (REGLAN) 10 MG tablet Take 1 tablet (10 mg total) by mouth every 6 (six) hours as needed (nausea/headache).  10 tablet  0  . nitroGLYCERIN (NITROSTAT) 0.4 MG SL tablet Place 0.4 mg under the tongue every 5 (five) minutes as needed for chest pain.      Marland Kitchen ondansetron (ZOFRAN) 4 MG tablet Take 1 tablet (4 mg total) by mouth every 8 (eight) hours as needed for nausea.  20 tablet  0  . pantoprazole (PROTONIX) 40 MG tablet Take 1 tablet (40 mg total) by mouth daily.  30 tablet  1  . potassium chloride (KLOR-CON) 20 MEQ packet Take 20-40 mEq by mouth 2 (two) times daily. Take 2 tablets every morning and take 1 tablet every evening      . promethazine (PHENERGAN) 25 MG tablet Take 1 tablet (25 mg total) by mouth every 6 (six) hours as needed for nausea.  12 tablet  0   No current facility-administered medications for this visit.    Allergies  Review of patient's allergies indicates no known allergies.  Electrocardiogram:  Assessment and Plan

## 2013-06-07 NOTE — Patient Instructions (Addendum)
Your physician has requested that you have a cardiac catheterization. Cardiac catheterization is used to diagnose and/or treat various heart conditions. Doctors may recommend this procedure for a number of different reasons. The most common reason is to evaluate chest pain. Chest pain can be a symptom of coronary artery disease (CAD), and cardiac catheterization can show whether plaque is narrowing or blocking your heart's arteries. This procedure is also used to evaluate the valves, as well as measure the blood flow and oxygen levels in different parts of your heart. For further information please visit [email or web address]. Please follow instruction sheet, as given.  Your physician has recommended you make the following change in your medication: INCREASE  FUROSEMIDE TO  20 MG TWICE DAILY AND START   ALDACTONE  12.5 MG  EVERY DAY   Your physician recommends that you return for lab work in: TODAY BMET BNP CBC  INR DX  V72.81   EDEMA You have been referred to GI   [facility]

## 2013-06-07 NOTE — Assessment & Plan Note (Signed)
Cholesterol is at goal.  Continue current dose of statin and diet Rx.  No myalgias or side effects.  F/U  LFT's in 6 months. Lab Results  Component Value Date   LDLCALC 144* 04/17/2013

## 2013-06-13 ENCOUNTER — Ambulatory Visit (HOSPITAL_BASED_OUTPATIENT_CLINIC_OR_DEPARTMENT_OTHER)
Admission: RE | Admit: 2013-06-13 | Discharge: 2013-06-13 | Disposition: A | Discharge status: Home / Self Care | Payer: Medicaid Other | Source: Ambulatory Visit | Attending: Cardiovascular Disease | Admitting: Cardiovascular Disease

## 2013-06-13 ENCOUNTER — Telehealth: Payer: Self-pay | Admitting: *Deleted

## 2013-06-13 ENCOUNTER — Encounter (HOSPITAL_COMMUNITY)
Admission: RE | Disposition: A | Discharge status: Home / Self Care | Payer: Self-pay | Source: Ambulatory Visit | Attending: Cardiovascular Disease

## 2013-06-13 ENCOUNTER — Other Ambulatory Visit: Payer: Self-pay

## 2013-06-13 DIAGNOSIS — I509 Heart failure, unspecified: Secondary | ICD-10-CM

## 2013-06-13 DIAGNOSIS — I5022 Chronic systolic (congestive) heart failure: Secondary | ICD-10-CM

## 2013-06-13 DIAGNOSIS — I2789 Other specified pulmonary heart diseases: Secondary | ICD-10-CM | POA: Insufficient documentation

## 2013-06-13 DIAGNOSIS — Z79899 Other long term (current) drug therapy: Secondary | ICD-10-CM | POA: Insufficient documentation

## 2013-06-13 DIAGNOSIS — I2510 Atherosclerotic heart disease of native coronary artery without angina pectoris: Secondary | ICD-10-CM | POA: Insufficient documentation

## 2013-06-13 DIAGNOSIS — I428 Other cardiomyopathies: Secondary | ICD-10-CM | POA: Insufficient documentation

## 2013-06-13 DIAGNOSIS — Z7982 Long term (current) use of aspirin: Secondary | ICD-10-CM | POA: Insufficient documentation

## 2013-06-13 DIAGNOSIS — I279 Pulmonary heart disease, unspecified: Secondary | ICD-10-CM

## 2013-06-13 HISTORY — PX: RIGHT HEART CATHETERIZATION: SHX5447

## 2013-06-13 LAB — POCT I-STAT 3, VENOUS BLOOD GAS (G3P V)
Acid-base deficit: 2 mmol/L (ref 0.0–2.0)
O2 Saturation: 50 %
TCO2: 25 mmol/L (ref 0–100)
pO2, Ven: 28 mmHg — CL (ref 30.0–45.0)

## 2013-06-13 SURGERY — RIGHT HEART CATH
Anesthesia: LOCAL

## 2013-06-13 SURGERY — JV RIGHT HEART CATHETERIZATION
Anesthesia: Moderate Sedation

## 2013-06-13 MED ORDER — FUROSEMIDE 20 MG PO TABS: 20.0000 mg | ORAL_TABLET | Freq: Two times a day (BID) | ORAL | Status: DC

## 2013-06-13 MED ORDER — SODIUM CHLORIDE 0.9 % IJ SOLN: 3.0000 mL | Freq: Two times a day (BID) | INTRAMUSCULAR | Status: DC

## 2013-06-13 MED ORDER — FUROSEMIDE 10 MG/ML IJ SOLN
INTRAMUSCULAR | Status: AC
  Filled 2013-06-13: qty 4

## 2013-06-13 MED ORDER — ISOSORBIDE DINITRATE 5 MG SL SUBL: SUBLINGUAL_TABLET | SUBLINGUAL | Status: DC

## 2013-06-13 MED ORDER — SODIUM CHLORIDE 0.9 % IJ SOLN: 3.0000 mL | INTRAMUSCULAR | Status: DC | PRN

## 2013-06-13 MED ORDER — SODIUM CHLORIDE 0.9 % IV SOLN: 250.0000 mL | INTRAVENOUS | Status: DC | PRN

## 2013-06-13 MED ORDER — ASPIRIN 81 MG PO CHEW
CHEWABLE_TABLET | ORAL | Status: AC
  Filled 2013-06-13: qty 4

## 2013-06-13 MED ORDER — LIDOCAINE HCL (PF) 1 % IJ SOLN
INTRAMUSCULAR | Status: AC
  Filled 2013-06-13: qty 30

## 2013-06-13 MED ORDER — OXYCODONE-ACETAMINOPHEN 5-325 MG PO TABS: 1.0000 | ORAL_TABLET | ORAL | Status: DC | PRN

## 2013-06-13 MED ORDER — FENTANYL CITRATE 0.05 MG/ML IJ SOLN
INTRAMUSCULAR | Status: AC
  Filled 2013-06-13: qty 2

## 2013-06-13 MED ORDER — ACETAMINOPHEN 325 MG PO TABS: 650.0000 mg | ORAL_TABLET | ORAL | Status: DC | PRN

## 2013-06-13 MED ORDER — MIDAZOLAM HCL 2 MG/2ML IJ SOLN
INTRAMUSCULAR | Status: AC
  Filled 2013-06-13: qty 2

## 2013-06-13 MED ORDER — ASPIRIN EC 325 MG PO TBEC: 325.0000 mg | DELAYED_RELEASE_TABLET | Freq: Every day | ORAL | Status: DC

## 2013-06-13 MED ORDER — HEPARIN (PORCINE) IN NACL 2-0.9 UNIT/ML-% IJ SOLN
INTRAMUSCULAR | Status: AC
  Filled 2013-06-13: qty 1000

## 2013-06-13 MED ORDER — ASPIRIN 81 MG PO CHEW
324.0000 mg | CHEWABLE_TABLET | ORAL | Status: AC
  Administered 2013-06-13: 324 mg via ORAL

## 2013-06-13 MED ORDER — ONDANSETRON HCL 4 MG/2ML IJ SOLN: 4.0000 mg | Freq: Four times a day (QID) | INTRAMUSCULAR | Status: DC | PRN

## 2013-06-13 NOTE — H&P (View-Only) (Signed)
Patient ID: [facility], female   DOB: Sep 20, 1961, 52 y.o.   MRN: [id number] [name] is a 52 y.o. female with hx below relevant for idiopathic dilated cardiomyopathy, recent admission for decompensated CHF 4/14  who returned to the ED this month  with progressive epigastric discomfort. Patient reports that she has had these symptoms for several weeks but they have progressed over the last several days since she was discharged. Pain is described as burning and sometimes crampy "like a ball", located in her epigastric region. Episodic without apparent triggers. Sometimes a/w nausea and emesis. Reproducible. In ED, her evaluation was notable for a mildly elevated troponin that is essentially unchanged from her recent admission. She has been very compliant with her new medication program.   Previous diagnostic testing for coronary artery disease includes: cardiac catheterization and echocardiogram.  Previous history of cardiac disease includes Cardiomyopathy  CHF  Coronary Artery Disease. Coronary artery disease risk factors include: dyslipidemia, hypertension and sedentary lifestyle. Patient denies history of coronary angioplasty, coronary artery stent and previous M.I..  Last cath 2005 [facility] with no CAD and PA pressures in mid 40's  1. Pulmonary hypertension with pulmonary artery pressure of 49/23.  2. Mild to moderate coronary disease, medical treatment only. No high  grade stenoses.  3. Left ventricular dilatation with global hypokinesis and a calculated  ejection fraction of 30%. This is disproportionately low compared to  the mild coronary disease. It appears to be an idiopathic  cardiomyopathy and will be best managed with medical therapy.  CT  Abdomen 6/13 showed ? cirhosis IMPRESSION:  1. Findings suggesting cirrhosis or hepatitis of the liver. Another possibility for the heterogeneity of the liver is right heart failure. 2. Findings in the chest compatible with congestive  heart failure, including interstitial pulmonary edema, mild cardiomegaly, small to moderate-sized right pleural effusion and minimal left pleural effusion. 3. 3.2 cm calcified uterine fibroid.  ROS: Denies fever, malais, weight loss, blurry vision, decreased visual acuity, cough, sputum, SOB, hemoptysis, pleuritic pain, palpitaitons, heartburn, abdominal pain, melena, lower extremity edema, claudication, or rash.  All other systems reviewed and negative  General: Affect appropriate Healthy:  appears stated age 71: normal Neck supple with no adenopathy JVP normal no bruits no thyromegaly Lungs clear with no wheezing and good diaphragmatic motion Heart:  S1/S2 no murmur, no rub, gallop or click PMI normal Abdomen: benighn, BS positve, no tenderness, no AAA no bruit.  No HSM or HJR Distal pulses intact with no bruits No edema Neuro non-focal Skin warm and dry No muscular weakness   Current Outpatient Prescriptions  Medication Sig Dispense Refill  . albuterol (PROVENTIL HFA;VENTOLIN HFA) 108 (90 BASE) MCG/ACT inhaler Inhale 2 puffs into the lungs every 4 (four) hours as needed for wheezing or shortness of breath.      Marland Kitchen albuterol (PROVENTIL HFA;VENTOLIN HFA) 108 (90 BASE) MCG/ACT inhaler Inhale 1-2 puffs into the lungs every 6 (six) hours as needed for wheezing.  1 Inhaler  0  . aspirin EC 81 MG EC tablet Take 1 tablet (81 mg total) by mouth daily.      Marland Kitchen atorvastatin (LIPITOR) 20 MG tablet Take 1 tablet (20 mg total) by mouth daily at 6 PM.  30 tablet  6  . carvedilol (COREG) 3.125 MG tablet Take 1 tablet (3.125 mg total) by mouth 2 (two) times daily with a meal.  60 tablet  6  . ciprofloxacin (CIPRO) 500 MG tablet Take 1 tablet (500 mg total) by mouth 2 (two)  times daily.  6 tablet  0  . ferrous sulfate 325 (65 FE) MG tablet Take 325 mg by mouth daily with breakfast.      . furosemide (LASIX) 20 MG tablet Take 1 tablet (20 mg total) by mouth daily.  30 tablet  6  .  HYDROcodone-acetaminophen (NORCO/VICODIN) 5-325 MG per tablet Take 1 tablet by mouth every 4 (four) hours as needed for pain.  12 tablet  0  . lisinopril (PRINIVIL,ZESTRIL) 10 MG tablet Take 1 tablet (10 mg total) by mouth daily.  30 tablet  0  . metoCLOPramide (REGLAN) 10 MG tablet Take 1 tablet (10 mg total) by mouth every 6 (six) hours as needed (nausea/headache).  10 tablet  0  . nitroGLYCERIN (NITROSTAT) 0.4 MG SL tablet Place 0.4 mg under the tongue every 5 (five) minutes as needed for chest pain.      Marland Kitchen ondansetron (ZOFRAN) 4 MG tablet Take 1 tablet (4 mg total) by mouth every 8 (eight) hours as needed for nausea.  20 tablet  0  . pantoprazole (PROTONIX) 40 MG tablet Take 1 tablet (40 mg total) by mouth daily.  30 tablet  1  . potassium chloride (KLOR-CON) 20 MEQ packet Take 20-40 mEq by mouth 2 (two) times daily. Take 2 tablets every morning and take 1 tablet every evening      . promethazine (PHENERGAN) 25 MG tablet Take 1 tablet (25 mg total) by mouth every 6 (six) hours as needed for nausea.  12 tablet  0   No current facility-administered medications for this visit.    Allergies  Review of patient's allergies indicates no known allergies.  Electrocardiogram:  Assessment and Plan

## 2013-06-13 NOTE — CV Procedure (Signed)
Right Heart Catheterization:  Indication:  Pulmonary hypertension  Procedure:  Standard right heart catheterization was done from the right femoral vein using a 7Fr sheath and balloon flotation catheter.    Mean RA:  20  mmHg RV: 76/12 mmHg PA: 75/36 mmHg with a mean of 49 mmHg PCWP: 27 mmHg  Ao sat : 94% PA sat 50%   Fick Cardiac Output:  3.1 L/minute,  1.8 L/minute/M2  PVR: 8.0 wood unites 567 dysnes-sec cm5  Impression:  PAD significantly higher than PCWP indicating some intrinsic pulmonary hypertension.  Increase diuretics and add nitrates Refer to CHF clinic and continue f/u with pulmonary ? Flolan  Jenkins Rouge

## 2013-06-13 NOTE — Interval H&P Note (Signed)
History and Physical Interval Note:  06/13/2013 7:45 AM  [facility]  has presented today for surgery, with the diagnosis of ademia  The various methods of treatment have been discussed with the patient and family. After consideration of risks, benefits and other options for treatment, the patient has consented to  Procedure(s): RIGHT HEART CATH (N/A) as a surgical intervention .  The patient's history has been reviewed, patient examined, no change in status, stable for surgery.  I have reviewed the patient's chart and labs.  Questions were answered to the patient's satisfaction.     Jenkins Rouge

## 2013-06-13 NOTE — Telephone Encounter (Signed)
         [place], [place] - 06/07/13 ','<More Detail >>       Josue Hector, MD       Sent: Thu June 13, 2013 8:13 AM                  Message     Can you call in lasix 20 bid instead of daily and isordil 10 tid to [place]. F/U with CHF clinic first available for pulmonary hypertension ? flolan            Attached Reports    The sender attached the following reports to this message:                Patient Information    Patient Name Sex DOB [name], [name] Female 1961-02-11 [id number]      CV Procedure signed by Josue Hector, MD at 06/13/2013 8:03 AM    Author: Josue Hector, MD Service: (none) Author Type: Physician   Filed: 06/13/2013 8:03 AM Note Time: 06/13/2013 7:56 AM         Right Heart Catheterization:  Indication: Pulmonary hypertension  Procedure: Standard right heart catheterization was done from the right femoral vein using a 7Fr sheath and balloon flotation catheter.  Mean RA: 20 mmHg  RV: 76/12 mmHg  PA: 75/36 mmHg with a mean of 49 mmHg  PCWP: 27 mmHg  Ao sat : 94% PA sat 50%  Fick Cardiac Output: 3.1 L/minute, 1.8 L/minute/M2  PVR: 8.0 wood unites 567 dysnes-sec cm5  Impression: PAD significantly higher than PCWP indicating some intrinsic pulmonary hypertension. Increase diuretics and add nitrates  Refer to CHF clinic and continue f/u with pulmonary ? Flolan  Jenkins Rouge       PT AWARE./CY

## 2013-06-14 NOTE — Progress Notes (Signed)
Per Dr. [name] Cancel,  Lasix 20 mg daily needs to be started, isordil 10 mg tid, and refer to CHF clinic.

## 2013-06-17 ENCOUNTER — Encounter (HOSPITAL_COMMUNITY): Payer: Self-pay

## 2013-06-17 ENCOUNTER — Ambulatory Visit (HOSPITAL_COMMUNITY)
Admission: RE | Admit: 2013-06-17 | Discharge: 2013-06-17 | Disposition: A | Discharge status: Home / Self Care | Payer: Medicaid Other | Source: Ambulatory Visit | Attending: Internal Medicine | Admitting: Internal Medicine

## 2013-06-17 ENCOUNTER — Ambulatory Visit (INDEPENDENT_AMBULATORY_CARE_PROVIDER_SITE_OTHER): Payer: Self-pay | Admitting: Cardiology

## 2013-06-17 ENCOUNTER — Encounter: Payer: Self-pay | Admitting: Cardiology

## 2013-06-17 VITALS — BP 140/92 | HR 90 | Wt 152.0 lb

## 2013-06-17 DIAGNOSIS — I5041 Acute combined systolic (congestive) and diastolic (congestive) heart failure: Secondary | ICD-10-CM | POA: Insufficient documentation

## 2013-06-17 DIAGNOSIS — I5023 Acute on chronic systolic (congestive) heart failure: Secondary | ICD-10-CM

## 2013-06-17 DIAGNOSIS — R0683 Snoring: Secondary | ICD-10-CM

## 2013-06-17 DIAGNOSIS — I428 Other cardiomyopathies: Secondary | ICD-10-CM

## 2013-06-17 DIAGNOSIS — I272 Pulmonary hypertension, unspecified: Secondary | ICD-10-CM | POA: Insufficient documentation

## 2013-06-17 DIAGNOSIS — R0989 Other specified symptoms and signs involving the circulatory and respiratory systems: Secondary | ICD-10-CM | POA: Insufficient documentation

## 2013-06-17 DIAGNOSIS — R0609 Other forms of dyspnea: Secondary | ICD-10-CM | POA: Insufficient documentation

## 2013-06-17 DIAGNOSIS — I2789 Other specified pulmonary heart diseases: Secondary | ICD-10-CM

## 2013-06-17 LAB — BASIC METABOLIC PANEL
BUN: 8 mg/dL (ref 6–23)
CO2: 25 mEq/L (ref 19–32)
Chloride: 105 mEq/L (ref 96–112)
GFR calc non Af Amer: 76 mL/min — ABNORMAL LOW (ref >90)
Glucose, Bld: 122 mg/dL — ABNORMAL HIGH (ref 70–99)
Potassium: 3.8 mEq/L (ref 3.5–5.1)
Sodium: 140 mEq/L (ref 135–145)

## 2013-06-17 LAB — HIV ANTIBODY (ROUTINE TESTING W REFLEX): HIV: NONREACTIVE

## 2013-06-17 NOTE — Progress Notes (Signed)
Patient ID: [facility], female   DOB: 09/28/1961, 52 y.o.   MRN: [id number] Cardiologist: Dr [name] Cancel   HPI:  [name] i s 52 year old with a history of NICM, HTN, and chronic systolic heart failure 99991111 (2009 EF 55%), anemic, and asthma. Multiple ED visits.   Last cath 2005 Al [facility] with no CAD and PA pressures in mid 40's  1. Pulmonary hypertension with pulmonary artery pressure of 49/23.  2. Mild to moderate coronary disease, medical treatment only. No high  grade stenoses.  3. Left ventricular dilatation with global hypokinesis and a calculated  ejection fraction of 30%. This is disproportionately low compared to  the mild coronary disease. It appears to be an idiopathic  cardiomyopathy and will be best managed with medical therapy.   CT of chest 03/18/13 no PE but prominent interstitial markings.  Admitted to [facility] 04/17/13 with increased dyspnea. Diuresed with IV lasix. She was evaluated in [facility] ED. Had been off all HF medications. She was restarted on carvedilol 3.125 mg bid lasix 20 mg daily, lisinopril 10 mg daily.  .  06/13/13 RHC  Mean RA: 20 mmHg  RV: 76/12 mmHg  PA: 75/36 mmHg with a mean of 49 mmHg  PCWP: 27 mmHg  Ao sat : 94% PA sat 50%  Fick Cardiac Output: 3.1 L/minute, 1.8 L/minute/M2  PVR: 8.0 wood unites 567 dysnes-sec cm5   She presents for follow up at the request of Dr [name] Cancel for pulmonary hypertension. She says she has been sob since March of this year. Complains dyspnea with exertion. Complains of lower extremity edema. She requires assistance with ADLs.She can only walk 1 block very slowly with multiple breaks. Weight at home 152-160 pounds. Compliant with medications. She has difficulty paying for medications. Medicaid pending.   ROS: All systems negative except as listed in HPI, PMH and Problem List.   Past Medical History   Diagnosis  Date   .  Hypertension    .  Hypercholesterolemia    .  Chronic systolic CHF (congestive heart failure)    .  Asthma     .  Nonischemic cardiomyopathy      52. 2005 Cath: nonobstructive dzs; b. 04/2013 Echo: EF 3035%, diff HK, restrictive physiology, mildly dil LA, PASP 15mmHg.   .  Microcytic anemia     Current Outpatient Prescriptions   Medication  Sig  Dispense  Refill   .  albuterol (PROVENTIL HFA;VENTOLIN HFA) 108 (90 BASE) MCG/ACT inhaler  Inhale 2 puffs into the lungs every 4 (four) hours as needed for wheezing or shortness of breath.     Marland Kitchen  albuterol (PROVENTIL HFA;VENTOLIN HFA) 108 (90 BASE) MCG/ACT inhaler  Inhale 1-2 puffs into the lungs every 6 (six) hours as needed for wheezing.  1 Inhaler  0   .  aspirin EC 81 MG EC tablet  Take 1 tablet (81 mg total) by mouth daily.     Marland Kitchen  atorvastatin (LIPITOR) 20 MG tablet  Take 1 tablet (20 mg total) by mouth daily at 6 PM.  30 tablet  6   .  carvedilol (COREG) 3.125 MG tablet  Take 1 tablet (3.125 mg total) by mouth 2 (two) times daily with a meal.  60 tablet  6   .  ferrous sulfate 325 (65 FE) MG tablet  Take 325 mg by mouth daily with breakfast.     .  furosemide (LASIX) 20 MG tablet  Take 1 tablet (20 mg total) by mouth 2 (two)  times daily.  60 tablet  6   .  HYDROcodone-acetaminophen (NORCO/VICODIN) 5-325 MG per tablet  Take 1 tablet by mouth every 4 (four) hours as needed for pain.  12 tablet  0   .  isosorbide dinitrate (ISORDIL) 5 MG SL tablet  2 TABS TID  180 tablet  12   .  lisinopril (PRINIVIL,ZESTRIL) 10 MG tablet  Take 1 tablet (10 mg total) by mouth daily.  30 tablet  0   .  nitroGLYCERIN (NITROSTAT) 0.4 MG SL tablet  Place 0.4 mg under the tongue every 5 (five) minutes as needed for chest pain.     .  potassium chloride (KLOR-CON) 20 MEQ packet  Take 20-40 mEq by mouth 2 (two) times daily. Take 2 tablets every morning and take 1 tablet every evening     .  spironolactone (ALDACTONE) 12.5 mg TABS  Take 0.5 tablets (12.5 mg total) by mouth daily.  30 tablet  6   .  metoCLOPramide (REGLAN) 10 MG tablet  Take 1 tablet (10 mg total) by mouth every 6 (six)  hours as needed (nausea/headache).  10 tablet  0   .  pantoprazole (PROTONIX) 40 MG tablet  Take 1 tablet (40 mg total) by mouth daily.  30 tablet  1   .  promethazine (PHENERGAN) 25 MG tablet  Take 1 tablet (25 mg total) by mouth every 6 (six) hours as needed for nausea.  12 tablet  0    No current facility-administered medications for this encounter.   PHYSICAL EXAM:  Filed Vitals:    06/17/13 1114   BP:  140/92   Pulse:  90   Weight:  152 lb (68.947 kg)   SpO2:  100%   General: Chronically ill appearing. No resp difficulty  HEENT: normal  Neck: supple. JVP to jaw. flat. Carotids 2+ bilaterally; no bruits. No lymphadenopathy or thryomegaly appreciated.  Cor: PMI normal. Regular rate & rhythm. No rubs, or murmurs. No S3  Lungs: clear  Abdomen: soft, nontender, nondistended. No hepatosplenomegaly. No bruits or masses. Good bowel sounds.  Extremities: no cyanosis, clubbing, rash, R and LLE 3+ edema to knees  Neuro: alert & orientedx3, cranial nerves grossly intact. Moves all 4 extremities w/o difficulty. Affect pleasant.   ASSESSMENT & PLAN:  1. Pulmonary HTN- Will need to manage volume first. Will need sleep study to r/o OSA. VQ to r/o chronic PE. Check ANn HIV, rheumatology serologies.  2. A/C Systolic Heart Failure NYHA IIIB. Volume status elevated. Increase lasix to 60 mg bid, increase spironolactone to 25 mg daily. Continue carvedilol 3.125 and lisinopril 10 mg daily.   Follow up in next week.   Darrick Grinder NP  06/17/13   Patient seen with NP, agree with the above note.  1. Pulmonary HTN: Combined pulmonary arterial and pulmonary venous HTN. There definitely is a component of PAH given PVR 8 WU. Etiology unclear, will need workup for causes of PAH. She had a recent CTA chest which did show some interstitial changes, cannot rule out an ILD pattern. She has class III symptoms.  - She will need therapy with pulmonary vasodilators but would like to see lower PCWP prior to initiation. She  also will need adenosine challenge. Would plan accentuated diuresis then repeat RHC with adenosine challenge => if PCWP lowered, begin pulmonary vasodilator (possibly macitentan).  - Will workup causes of PAH with sleep study, PFTs, V/Q scan for chronic PE, RF/ANA/HIV.  2. Chronic systolic CHF: EF 99991111 (decrease from prior  echo) with diffuse hypokinesis. Suspect nonischemic cardiomyopathy. Volume overloaded on exam with elevated PCWP at RHC.  - Continue Coreg 3.125 mg bid and lisinopril 10 mg daily  - Increase spironolactone to 25 mg daily.  - Increase Lasix to 60 mg po bid with KCl 40 bid.  - LHC with followup RHC to rule out CAD given new fall in EF.  - BMET and office followup in 1 week.   Loralie Champagne  06/17/2013  12:35 PM

## 2013-06-17 NOTE — Progress Notes (Signed)
Patient ID: [facility], female   DOB: May 14, 1961, 52 y.o.   MRN: [id number]  Weight Range   Baseline proBNP   Cardiologist: Dr [name] Cancel  HPI: [name] i s 52 year old with a history of NICM, HTN, and chronic systolic heart failure 99991111 (2009 EF 55%), anemic, and asthma.  Multiple ED visits.   Last cath 2005 [facility] with no CAD and PA pressures in mid 40's  1. Pulmonary hypertension with pulmonary artery pressure of 49/23.  2. Mild to moderate coronary disease, medical treatment only. No high  grade stenoses.  3. Left ventricular dilatation with global hypokinesis and a calculated  ejection fraction of 30%. This is disproportionately low compared to  the mild coronary disease. It appears to be an idiopathic  cardiomyopathy and will be best managed with medical therapy.   CT of chest 03/18/13 no PE  Admitted to [facility] 04/17/13 with increased dyspnea. Diuresed with IV lasix. She was evaluated in [facility] ED. Had been off all HF medications. She was restarted on carvedilol 3.125 mg bid  lasix 20 mg daily, lisinopril  10 mg daily. .   06/13/13 RHC Mean RA: 20 mmHg  RV: 76/12 mmHg  PA: 75/36 mmHg with a mean of 49 mmHg  PCWP: 27 mmHg  Ao sat : 94% PA sat 50%  Fick Cardiac Output: 3.1 L/minute, 1.8 L/minute/M2  PVR: 8.0 wood unites 567 dysnes-sec cm5  She presents for follow up at the request of Dr [name] Cancel for pulmonary hypertension. She says she has been sob since March of this year. Complains dyspnea with exertion. Complains of lower extremity edema. She requires assistance with ADLs.She can only walk 1 block very slowly with multiple breaks. Weight at home 152-160 pounds. Compliant with medications. She has difficulty paying for medications. Medicaid pending.     ROS: All systems negative except as listed in HPI, PMH and Problem List.  Past Medical History  Diagnosis Date  . Hypertension   . Hypercholesterolemia   . Chronic systolic CHF (congestive heart failure)   . Asthma   .  Nonischemic cardiomyopathy     a. 2005 Cath: nonobstructive dzs;  b. 04/2013 Echo: EF 3035%, diff HK, restrictive physiology, mildly dil LA, PASP 53mmHg.  . Microcytic anemia     Current Outpatient Prescriptions  Medication Sig Dispense Refill  . albuterol (PROVENTIL HFA;VENTOLIN HFA) 108 (90 BASE) MCG/ACT inhaler Inhale 2 puffs into the lungs every 4 (four) hours as needed for wheezing or shortness of breath.      Marland Kitchen albuterol (PROVENTIL HFA;VENTOLIN HFA) 108 (90 BASE) MCG/ACT inhaler Inhale 1-2 puffs into the lungs every 6 (six) hours as needed for wheezing.  1 Inhaler  0  . aspirin EC 81 MG EC tablet Take 1 tablet (81 mg total) by mouth daily.      Marland Kitchen atorvastatin (LIPITOR) 20 MG tablet Take 1 tablet (20 mg total) by mouth daily at 6 PM.  30 tablet  6  . carvedilol (COREG) 3.125 MG tablet Take 1 tablet (3.125 mg total) by mouth 2 (two) times daily with a meal.  60 tablet  6  . ferrous sulfate 325 (65 FE) MG tablet Take 325 mg by mouth daily with breakfast.      . furosemide (LASIX) 20 MG tablet Take 1 tablet (20 mg total) by mouth 2 (two) times daily.  60 tablet  6  . HYDROcodone-acetaminophen (NORCO/VICODIN) 5-325 MG per tablet Take 1 tablet by mouth every 4 (four) hours as needed for pain.  12 tablet  0  . isosorbide dinitrate (ISORDIL) 5 MG SL tablet 2 TABS  TID  180 tablet  12  . lisinopril (PRINIVIL,ZESTRIL) 10 MG tablet Take 1 tablet (10 mg total) by mouth daily.  30 tablet  0  . nitroGLYCERIN (NITROSTAT) 0.4 MG SL tablet Place 0.4 mg under the tongue every 5 (five) minutes as needed for chest pain.      . potassium chloride (KLOR-CON) 20 MEQ packet Take 20-40 mEq by mouth 2 (two) times daily. Take 2 tablets every morning and take 1 tablet every evening      . spironolactone (ALDACTONE) 12.5 mg TABS Take 0.5 tablets (12.5 mg total) by mouth daily.  30 tablet  6  . metoCLOPramide (REGLAN) 10 MG tablet Take 1 tablet (10 mg total) by mouth every 6 (six) hours as needed (nausea/headache).  10  tablet  0  . pantoprazole (PROTONIX) 40 MG tablet Take 1 tablet (40 mg total) by mouth daily.  30 tablet  1  . promethazine (PHENERGAN) 25 MG tablet Take 1 tablet (25 mg total) by mouth every 6 (six) hours as needed for nausea.  12 tablet  0   No current facility-administered medications for this encounter.     PHYSICAL EXAM: Filed Vitals:   06/17/13 1114  BP: 140/92  Pulse: 90  Weight: 152 lb (68.947 kg)  SpO2: 100%    General:  Chronically ill appearing. No resp difficulty HEENT: normal Neck: supple. JVP to jaw. flat. Carotids 2+ bilaterally; no bruits. No lymphadenopathy or thryomegaly appreciated. Cor: PMI normal. Regular rate & rhythm. No rubs, or murmurs. No S3 Lungs: clear Abdomen: soft, nontender, nondistended. No hepatosplenomegaly. No bruits or masses. Good bowel sounds. Extremities: no cyanosis, clubbing, rash, R and LLE 3+ edema to knees  Neuro: alert & orientedx3, cranial nerves grossly intact. Moves all 4 extremities w/o difficulty. Affect pleasant.      ASSESSMENT & PLAN:  1. Pulmonary HTN- Will need to manage volume first. Will need sleep study to r/o OSA. VQ to r/o chronic PE. Check ANn HIV, rheumatology serologies.   1. A/C Systolic Heart Failure  NYHA IIIB. Volume status elevated. Increase lasix to 60 mg bid, increase spironolactone to 25 mg daily. Continue carvedilol 3.125 and lisinopril 10 mg daily.   Follow up in next week.   Teresa Grinder NP 06/17/13  Patient seen with NP, agree with the above note.   1. Pulmonary HTN: Combined pulmonary arterial and pulmonary venous HTN.  There definitely is a component of PAH given PVR 8 WU.  Etiology unclear, will need workup for causes of PAH.  She had a recent CTA chest which did show some interstitial changes, cannot rule out an ILD pattern.  She has class III symptoms.  - She will need therapy with pulmonary vasodilators but would like to see lower PCWP prior to initiation.  She also will need adenosine challenge.   Would plan accentuated diuresis then repeat RHC with adenosine challenge => if PCWP lowered, begin pulmonary vasodilator (possibly macitentan).  - Will workup causes of PAH with sleep study, PFTs, V/Q scan for chronic PE, RF/ANA/HIV.   2. Chronic systolic CHF: EF 99991111 (decrease from prior echo) with diffuse hypokinesis.  Suspect nonischemic cardiomyopathy.  Volume overloaded on exam with elevated PCWP at RHC.  - Continue Coreg 3.125 mg bid and lisinopril 10 mg daily - Increase spironolactone to 25 mg daily. - Increase Lasix to 60 mg po bid with KCl 40 bid.  - LHC with  followup RHC to rule out CAD given new fall in EF.  - BMET and office followup in 1 week.   [name] 06/17/2013 12:35 PM

## 2013-06-17 NOTE — Patient Instructions (Addendum)
Increase Furosemide (Lasix) to 20 mg 3 tabs Twice daily   Your physician has recommended that you have a pulmonary function test. Pulmonary Function Tests are a group of tests that measure how well air moves in and out of your lungs.  Friday 7/11 AT 9 AM, PLEASE ARRIVE AT ADMITTING OFFICE AT 8:45 AM  VQ Scan Friday 7/11 at 11 am, please arrive at Radiology at 10:45  You have been referred to PULMONARY:  DR [facility] 7/15 AT 9:45 AM, [place], [id number]  Your physician recommends that you schedule a follow-up appointment in: [place]

## 2013-06-18 ENCOUNTER — Encounter (HOSPITAL_COMMUNITY)
Admission: RE | Disposition: A | Discharge status: Home / Self Care | Payer: Self-pay | Source: Ambulatory Visit | Attending: Gastroenterology

## 2013-06-18 ENCOUNTER — Ambulatory Visit (HOSPITAL_COMMUNITY)
Admission: RE | Admit: 2013-06-18 | Discharge: 2013-06-18 | Disposition: A | Discharge status: Home / Self Care | Payer: Medicaid Other | Source: Ambulatory Visit | Attending: Gastroenterology | Admitting: Gastroenterology

## 2013-06-18 DIAGNOSIS — K219 Gastro-esophageal reflux disease without esophagitis: Secondary | ICD-10-CM

## 2013-06-18 SURGERY — EGD (ESOPHAGOGASTRODUODENOSCOPY)
Anesthesia: Moderate Sedation

## 2013-06-18 MED ORDER — SODIUM CHLORIDE 0.9 % IV SOLN: INTRAVENOUS | Status: DC

## 2013-06-18 NOTE — Interval H&P Note (Signed)
History and Physical Interval Note:  06/18/2013 9:07 AM  [facility]  has presented today for surgery, with the diagnosis of reflux/cirrhosis  The various methods of treatment have been discussed with the patient and family. After consideration of risks, benefits and other options for treatment, the patient has consented to  Procedure(s): ESOPHAGOGASTRODUODENOSCOPY (EGD) (N/A) as a surgical intervention .  The patient's history has been reviewed, patient examined, no change in status, stable for surgery.  I have reviewed the patient's chart and labs.  Questions were answered to the patient's satisfaction.     Teresa C.

## 2013-06-18 NOTE — H&P (Signed)
  Date of Initial H&P: 06/10/13  History reviewed, patient examined, no change in status, stable for surgery.

## 2013-06-25 ENCOUNTER — Ambulatory Visit (HOSPITAL_COMMUNITY)
Admission: RE | Admit: 2013-06-25 | Discharge: 2013-06-25 | Disposition: A | Discharge status: Home / Self Care | Payer: Medicaid Other | Source: Ambulatory Visit | Attending: Internal Medicine | Admitting: Internal Medicine

## 2013-06-25 ENCOUNTER — Encounter (HOSPITAL_COMMUNITY): Payer: Self-pay | Admitting: *Deleted

## 2013-06-25 VITALS — BP 146/78 | HR 92 | Wt 145.0 lb

## 2013-06-25 DIAGNOSIS — I2789 Other specified pulmonary heart diseases: Secondary | ICD-10-CM

## 2013-06-25 DIAGNOSIS — I272 Pulmonary hypertension, unspecified: Secondary | ICD-10-CM

## 2013-06-25 DIAGNOSIS — I5022 Chronic systolic (congestive) heart failure: Secondary | ICD-10-CM | POA: Insufficient documentation

## 2013-06-25 LAB — BASIC METABOLIC PANEL
CO2: 25 mEq/L (ref 19–32)
Chloride: 105 mEq/L (ref 96–112)
Glucose, Bld: 129 mg/dL — ABNORMAL HIGH (ref 70–99)
Potassium: 3.5 mEq/L (ref 3.5–5.1)
Sodium: 140 mEq/L (ref 135–145)

## 2013-06-25 MED ORDER — FUROSEMIDE 20 MG PO TABS: 80.0000 mg | ORAL_TABLET | Freq: Two times a day (BID) | ORAL | Status: DC

## 2013-06-25 NOTE — Assessment & Plan Note (Addendum)
NYHA IIIB. Volume status improving but remains elevated. Increase lasix to 80 mg twice a day. Plan for RHC/LHC on 07/05/13 to evaluate coronaries and hemodynamics. Provided scale today and weight chart for daily weights. Check BMET today. Follow up in 1 month  Patient seen and examined with Darrick Grinder, NP. We discussed all aspects of the encounter. I agree with the assessment and plan as stated above. She has fairly advanced HF with probable secondary PH. Volume remains elevated. Will increase lasix. Extensive HF education provided. Will arrange for repeat RHC to assess response to therapy and assess need to consider advanced therapies. Will also evaluate coronaries. Scale provided in cliniuc.

## 2013-06-25 NOTE — Assessment & Plan Note (Addendum)
Continue work up for pulmonary hypertension. Volume status improved plan for RHC/LHC 07/05/13. F/U 06/28/13 for  PFTS and pulmonary evaluations 07/02/13 with  Dr Gwenette Greet   Attending: I suspect this is mostly secondary Dale but PVR is high. Pending PFTs and pulmonary eval. Will repeat RHC soon and decide on adjunctive therapies at that point.

## 2013-06-25 NOTE — Progress Notes (Signed)
Patient ID: [facility], female   DOB: 1961/05/24, 52 y.o.   MRN: [id number] Cardiologist: Dr [name] Cancel   HPI:  Ms [place] i s 52 year old with a history of NICM, HTN, and chronic systolic heart failure 99991111 (2009 EF 55%), anemia, and asthma.   Last cath 2005 Al [facility] with no CAD and PA pressures in mid 40's  1. Pulmonary hypertension with pulmonary artery pressure of 49/23.  2. Mild to moderate coronary disease, medical treatment only. No high  grade stenoses.  3. Left ventricular dilatation with global hypokinesis and a calculated  ejection fraction of 30%. This is disproportionately low compared to  the mild coronary disease. It appears to be an idiopathic  cardiomyopathy and will be best managed with medical therapy.   CT of chest 03/18/13 no PE but prominent interstitial markings.  Admitted to [facility] 04/17/13 through 04/19/13 with increased dyspnea. Diuresed with IV lasix. D/c weight 160. She was re-evaluated in [facility] ED on 6/13. Had been off all HF medications. She was restarted on carvedilol 3.125 mg bid lasix 20 mg daily, lisinopril 10 mg daily and referred to Dr. [name] Cancel for further evaluation.   He performed outpatient RHC and referred her to HF Clinic for further evaluation of PH. .  06/13/13 RHC  Mean RA: 20 mmHg  RV: 76/12 mmHg  PA: 75/36 mmHg with a mean of 49 mmHg  PCWP: 27 mmHg  Ao sat : 94% PA sat 50%  Fick Cardiac Output: 3.1 L/minute, 1.8 L/minute/M2  PVR: 8.0 wood units 567 dysnes-sec cm5   We saw her 2 weeks ago and she was instructed to increase lasix to 60 mg bid and increase spironolactone to 25 mg daily. Dyspnea has improved. Ongoing difficulty sleeping at night due to PND and orthopnea.  Able to walk around the block. She has not been weighing at home because she does not have a scale. She has difficulty paying for medications. Drinking < 2 liters of fluid. Weight down 7 pounds here. Scheduled for VQ scan and PFTs  for 06/28/13. Follow up with Dr Gwenette Greet 07/02/13 for sleep  study.   Ambulated around the hall and O2 sats 99%.        ROS: All systems negative except as listed in HPI, PMH and Problem List.   Past Medical History   Diagnosis  Date   .  Hypertension    .  Hypercholesterolemia    .  Chronic systolic CHF (congestive heart failure)    .  Asthma    .  Nonischemic cardiomyopathy      a. 2005 Cath: nonobstructive dzs; b. 04/2013 Echo: EF 3035%, diff HK, restrictive physiology, mildly dil LA, PASP 21mmHg.   .  Microcytic anemia     Current Outpatient Prescriptions on File Prior to Encounter  Medication Sig Dispense Refill  . albuterol (PROVENTIL HFA;VENTOLIN HFA) 108 (90 BASE) MCG/ACT inhaler Inhale 2 puffs into the lungs every 4 (four) hours as needed for wheezing or shortness of breath.      Marland Kitchen aspirin EC 81 MG EC tablet Take 1 tablet (81 mg total) by mouth daily.      Marland Kitchen atorvastatin (LIPITOR) 20 MG tablet Take 1 tablet (20 mg total) by mouth daily at 6 PM.  30 tablet  6  . carvedilol (COREG) 3.125 MG tablet Take 1 tablet (3.125 mg total) by mouth 2 (two) times daily with a meal.  60 tablet  6  . ferrous sulfate 325 (65 FE) MG tablet Take 325  mg by mouth daily with breakfast.      . HYDROcodone-acetaminophen (NORCO/VICODIN) 5-325 MG per tablet Take 1 tablet by mouth every 4 (four) hours as needed for pain.  12 tablet  0  . isosorbide dinitrate (ISORDIL) 5 MG SL tablet 2 TABS  TID  180 tablet  12  . lisinopril (PRINIVIL,ZESTRIL) 10 MG tablet Take 1 tablet (10 mg total) by mouth daily.  30 tablet  0  . metoCLOPramide (REGLAN) 10 MG tablet Take 1 tablet (10 mg total) by mouth every 6 (six) hours as needed (nausea/headache).  10 tablet  0  . nitroGLYCERIN (NITROSTAT) 0.4 MG SL tablet Place 0.4 mg under the tongue every 5 (five) minutes as needed for chest pain.      . pantoprazole (PROTONIX) 40 MG tablet Take 1 tablet (40 mg total) by mouth daily.  30 tablet  1  . potassium chloride (KLOR-CON) 20 MEQ packet Take 20-40 mEq by mouth 2 (two) times daily.  Take 2 tablets every morning and take 1 tablet every evening      . promethazine (PHENERGAN) 25 MG tablet Take 1 tablet (25 mg total) by mouth every 6 (six) hours as needed for nausea.  12 tablet  0   No current facility-administered medications on file prior to encounter.   BP 146/78  Pulse 92  Wt 145 lb (65.772 kg)  BMI 26.96 kg/m2  SpO2 100%  General: Chronically ill appearing. No resp difficulty  HEENT: normal  Neck: supple. JVP ~7-8. Carotids 2+ bilaterally; no bruits. No lymphadenopathy or thryomegaly appreciated.  Cor: PMI normal. Regular rate & rhythm. No rubs, or murmurs. No S3  Lungs: clear  Abdomen: soft, nontender, nondistended. No hepatosplenomegaly. No bruits or masses. Good bowel sounds.  Extremities: no cyanosis, clubbing, rash, R and LLE 1-2+  Neuro: alert & orientedx3, cranial nerves grossly intact. Moves all 4 extremities w/o difficulty. Affect pleasant.   ASSESSMENT & PLAN:

## 2013-06-25 NOTE — Patient Instructions (Addendum)
Labs today  Your physician recommends that you schedule a follow-up appointment in: 1 month  

## 2013-06-26 ENCOUNTER — Other Ambulatory Visit (HOSPITAL_COMMUNITY): Payer: Self-pay | Admitting: Adult Health

## 2013-06-26 DIAGNOSIS — I5022 Chronic systolic (congestive) heart failure: Secondary | ICD-10-CM

## 2013-06-28 ENCOUNTER — Ambulatory Visit (HOSPITAL_COMMUNITY)
Admission: RE | Admit: 2013-06-28 | Discharge: 2013-06-28 | Disposition: A | Discharge status: Home / Self Care | Payer: Medicaid Other | Source: Ambulatory Visit | Attending: Adult Health | Admitting: Adult Health

## 2013-06-28 ENCOUNTER — Encounter (HOSPITAL_COMMUNITY)
Admission: RE | Admit: 2013-06-28 | Discharge: 2013-06-28 | Disposition: A | Discharge status: Home / Self Care | Payer: Medicaid Other | Source: Ambulatory Visit | Attending: Adult Health | Admitting: Adult Health

## 2013-06-28 DIAGNOSIS — I517 Cardiomegaly: Secondary | ICD-10-CM | POA: Insufficient documentation

## 2013-06-28 DIAGNOSIS — I509 Heart failure, unspecified: Secondary | ICD-10-CM | POA: Insufficient documentation

## 2013-06-28 DIAGNOSIS — I2789 Other specified pulmonary heart diseases: Secondary | ICD-10-CM | POA: Insufficient documentation

## 2013-06-28 DIAGNOSIS — I5023 Acute on chronic systolic (congestive) heart failure: Secondary | ICD-10-CM

## 2013-06-28 DIAGNOSIS — I10 Essential (primary) hypertension: Secondary | ICD-10-CM | POA: Insufficient documentation

## 2013-06-28 MED ORDER — TECHNETIUM TO 99M ALBUMIN AGGREGATED
6.0000 | Freq: Once | INTRAVENOUS | Status: AC | PRN
  Administered 2013-06-28: 6 via INTRAVENOUS

## 2013-06-28 MED ORDER — TECHNETIUM TC 99M DIETHYLENETRIAME-PENTAACETIC ACID: 40.0000 | Freq: Once | INTRAVENOUS | Status: AC | PRN

## 2013-06-28 MED ORDER — ALBUTEROL SULFATE (5 MG/ML) 0.5% IN NEBU
2.5000 mg | INHALATION_SOLUTION | Freq: Once | RESPIRATORY_TRACT | Status: AC
  Administered 2013-06-28: 2.5 mg via RESPIRATORY_TRACT

## 2013-07-02 ENCOUNTER — Encounter: Payer: Self-pay | Admitting: Pulmonary Disease

## 2013-07-02 ENCOUNTER — Ambulatory Visit (INDEPENDENT_AMBULATORY_CARE_PROVIDER_SITE_OTHER): Payer: Self-pay | Admitting: Pulmonary Disease

## 2013-07-02 VITALS — BP 112/68 | HR 73 | Temp 98.1°F | Ht 60.5 in | Wt 141.4 lb

## 2013-07-02 DIAGNOSIS — G478 Other sleep disorders: Secondary | ICD-10-CM

## 2013-07-02 NOTE — Patient Instructions (Addendum)
Work on your sleep schedule as we discussed (sleep restriction therapy) Would be happy to schedule a sleep study once you get medicaid, or sooner if you would like to evaluate your sleep issues (testing is expensive though).  Please call me once you are ready to schedule sleep study for further evaluation.

## 2013-07-02 NOTE — Assessment & Plan Note (Signed)
The patient has chronic and ongoing sleep issues that may or may not be related to a sleep disorder.  I think some of her sleep issues are related to sleep hygiene, and I have discussed with her the concept of sleep restriction therapy.  From the bed partner's description, she may have sleep disordered breathing in the form of central sleep apnea, and may also have some type of movement disorder of sleep.  She will obviously need a sleep study for evaluation, but unfortunately has no insurance and feels that she cannot afford at this time.  She is in the process of applying for Medicaid.

## 2013-07-02 NOTE — Progress Notes (Signed)
Subjective:    Patient ID: [facility], female    DOB: 08/31/1961, 52 y.o.   MRN: [id number]  HPI The patient is a 52 year old female who had been asked to see for ongoing sleep issues.  She has some difficulty going to sleep, but can typically achieve this in 45-60 minutes.  Her major problem at this time is frequent awakenings during the night for unknown reasons.  Her bed partner denies hearing snoring, but clearly sees an abnormal breathing pattern during sleep.  He also sees abnormal limb movements at times.  Overall, the patient feels that she is not rested in the mornings upon arising, but feels it has improved most recently.  She denies any restless leg syndrome symptoms, and her bed partner has not appreciated any kicking during the night.  The patient believes that her sleep issue may be related to her prior work schedule in the past where she started at 3 to 4 AM.  She does have some sleep pressure during the day with periods of inactivity, but does not have any napping during the day.  She denies any issues with sleepiness in the evening watching television.  The patient does not drive currently.  Her weight is down 10 pounds over the last 2 years, and her Epworth score today is zero.   Sleep Questionnaire What time do you typically go to bed?( Between what hours) 1030-11 1030-11 at 0936 on 07/02/13 by Danella Maiers, CMA How long does it take you to fall asleep? 69mins 62mins at 0936 on 07/02/13 by Danella Maiers, CMA How many times during the night do you wake up? 6 6 at 0936 on 07/02/13 by Danella Maiers, CMA What time do you get out of bed to start your day? 0730 0730 at 0936 on 07/02/13 by Danella Maiers, CMA Do you drive or operate heavy machinery in your occupation? No No at 0936 on 07/02/13 by Danella Maiers, CMA How much has your weight changed (up or down) over the past two years? (In pounds) 10 lb (4.536 kg)10 lb (4.536 kg) decrease at 0936 on 07/02/13 by Danella Maiers, CMA Have you ever had a sleep study before? No No at 0936 on 07/02/13 by Danella Maiers, CMA Do you currently use CPAP? No No at 0936 on 07/02/13 by Ivy Lynn Mabe, CMA Do you wear oxygen at any time? No    Review of Systems  Constitutional: Negative for fever and unexpected weight change.  HENT: Positive for congestion. Negative for ear pain, nosebleeds, sore throat, rhinorrhea, sneezing, trouble swallowing, dental problem, postnasal drip and sinus pressure.   Eyes: Negative for redness and itching.  Respiratory: Positive for cough and shortness of breath. Negative for chest tightness and wheezing.   Cardiovascular: Negative for palpitations and leg swelling.  Gastrointestinal: Negative for nausea and vomiting.  Genitourinary: Negative for dysuria.  Musculoskeletal: Negative for joint swelling.  Skin: Negative for rash.  Neurological: Positive for headaches.  Hematological: Does not bruise/bleed easily.  Psychiatric/Behavioral: Negative for dysphoric mood. The patient is not nervous/anxious.        Objective:   Physical Exam Constitutional:  Well developed, no acute distress  HENT:  Nares patent without discharge  Oropharynx without exudate, palate and uvula are mildly elongated  Eyes:  Perrla, eomi, no scleral icterus  Neck:  No JVD, no TMG  Cardiovascular:  Normal rate, regular rhythm, no rubs or gallops.  No murmurs  Intact distal pulses  Pulmonary :  Normal breath sounds, no stridor or respiratory distress   No rales, rhonchi, or wheezing  Abdominal:  Soft, nondistended, bowel sounds present.  No tenderness noted.   Musculoskeletal:  minimal lower extremity edema noted.  Lymph Nodes:  No cervical lymphadenopathy noted  Skin:  No cyanosis noted  Neurologic:  Alert, appropriate, moves all 4 extremities without obvious deficit.         Assessment & Plan:

## 2013-07-05 ENCOUNTER — Ambulatory Visit (HOSPITAL_COMMUNITY)
Admission: RE | Admit: 2013-07-05 | Discharge: 2013-07-05 | Disposition: A | Discharge status: Home / Self Care | Payer: Medicaid Other | Source: Ambulatory Visit | Attending: Cardiology | Admitting: Cardiology

## 2013-07-05 ENCOUNTER — Encounter (HOSPITAL_COMMUNITY)
Admission: RE | Disposition: A | Discharge status: Home / Self Care | Payer: Self-pay | Source: Ambulatory Visit | Attending: Cardiology

## 2013-07-05 DIAGNOSIS — I2510 Atherosclerotic heart disease of native coronary artery without angina pectoris: Secondary | ICD-10-CM | POA: Insufficient documentation

## 2013-07-05 DIAGNOSIS — D509 Iron deficiency anemia, unspecified: Secondary | ICD-10-CM | POA: Insufficient documentation

## 2013-07-05 DIAGNOSIS — E7800 Pure hypercholesterolemia, unspecified: Secondary | ICD-10-CM | POA: Insufficient documentation

## 2013-07-05 DIAGNOSIS — I2789 Other specified pulmonary heart diseases: Secondary | ICD-10-CM | POA: Insufficient documentation

## 2013-07-05 DIAGNOSIS — I428 Other cardiomyopathies: Secondary | ICD-10-CM | POA: Insufficient documentation

## 2013-07-05 DIAGNOSIS — J45909 Unspecified asthma, uncomplicated: Secondary | ICD-10-CM | POA: Insufficient documentation

## 2013-07-05 DIAGNOSIS — I509 Heart failure, unspecified: Secondary | ICD-10-CM | POA: Insufficient documentation

## 2013-07-05 DIAGNOSIS — Z7982 Long term (current) use of aspirin: Secondary | ICD-10-CM | POA: Insufficient documentation

## 2013-07-05 DIAGNOSIS — I279 Pulmonary heart disease, unspecified: Secondary | ICD-10-CM

## 2013-07-05 DIAGNOSIS — I5022 Chronic systolic (congestive) heart failure: Secondary | ICD-10-CM

## 2013-07-05 DIAGNOSIS — I10 Essential (primary) hypertension: Secondary | ICD-10-CM | POA: Insufficient documentation

## 2013-07-05 DIAGNOSIS — Z79899 Other long term (current) drug therapy: Secondary | ICD-10-CM | POA: Insufficient documentation

## 2013-07-05 HISTORY — PX: LEFT AND RIGHT HEART CATHETERIZATION WITH CORONARY ANGIOGRAM: SHX5449

## 2013-07-05 LAB — PROTIME-INR: INR: 1.05 (ref 0.00–1.49)

## 2013-07-05 LAB — CBC
HCT: 45.4 % (ref 36.0–46.0)
Hemoglobin: 15.6 g/dL — ABNORMAL HIGH (ref 12.0–15.0)
MCH: 27.5 pg (ref 26.0–34.0)
MCHC: 34.4 g/dL (ref 30.0–36.0)
MCV: 79.9 fL (ref 78.0–100.0)

## 2013-07-05 LAB — POCT I-STAT 3, ART BLOOD GAS (G3+)
Acid-Base Excess: 1 mmol/L (ref 0.0–2.0)
O2 Saturation: 93 %
TCO2: 29 mmol/L (ref 0–100)
pCO2 arterial: 48 mmHg — ABNORMAL HIGH (ref 35.0–45.0)
pO2, Arterial: 70 mmHg — ABNORMAL LOW (ref 80.0–100.0)

## 2013-07-05 LAB — BASIC METABOLIC PANEL
BUN: 10 mg/dL (ref 6–23)
CO2: 27 mEq/L (ref 19–32)
Chloride: 101 mEq/L (ref 96–112)
Glucose, Bld: 95 mg/dL (ref 70–99)
Potassium: 4.4 mEq/L (ref 3.5–5.1)

## 2013-07-05 LAB — POCT I-STAT 3, VENOUS BLOOD GAS (G3P V)
Acid-base deficit: 1 mmol/L (ref 0.0–2.0)
O2 Saturation: 69 %
TCO2: 28 mmol/L (ref 0–100)
pCO2, Ven: 51.2 mmHg — ABNORMAL HIGH (ref 45.0–50.0)

## 2013-07-05 SURGERY — LEFT AND RIGHT HEART CATHETERIZATION WITH CORONARY ANGIOGRAM
Anesthesia: LOCAL

## 2013-07-05 MED ORDER — ONDANSETRON HCL 4 MG/2ML IJ SOLN: 4.0000 mg | Freq: Four times a day (QID) | INTRAMUSCULAR | Status: DC | PRN

## 2013-07-05 MED ORDER — SODIUM CHLORIDE 0.9 % IV SOLN: 250.0000 mL | INTRAVENOUS | Status: DC | PRN

## 2013-07-05 MED ORDER — MIDAZOLAM HCL 2 MG/2ML IJ SOLN
INTRAMUSCULAR | Status: AC
  Filled 2013-07-05: qty 2

## 2013-07-05 MED ORDER — SODIUM CHLORIDE 0.9 % IV SOLN: INTRAVENOUS | Status: DC

## 2013-07-05 MED ORDER — LIDOCAINE HCL (PF) 1 % IJ SOLN
INTRAMUSCULAR | Status: AC
  Filled 2013-07-05: qty 30

## 2013-07-05 MED ORDER — SODIUM CHLORIDE 0.9 % IJ SOLN: 3.0000 mL | INTRAMUSCULAR | Status: DC | PRN

## 2013-07-05 MED ORDER — ASPIRIN 81 MG PO CHEW
324.0000 mg | CHEWABLE_TABLET | ORAL | Status: AC
  Administered 2013-07-05: 324 mg via ORAL
  Filled 2013-07-05: qty 4

## 2013-07-05 MED ORDER — ADENOSINE 12 MG/4ML IV SOLN
12.0000 mL | Freq: Once | INTRAVENOUS | Status: DC
  Filled 2013-07-05: qty 12

## 2013-07-05 MED ORDER — DIAZEPAM 5 MG PO TABS
5.0000 mg | ORAL_TABLET | ORAL | Status: AC
  Administered 2013-07-05: 5 mg via ORAL
  Filled 2013-07-05: qty 1

## 2013-07-05 MED ORDER — SODIUM CHLORIDE 0.9 % IJ SOLN: 3.0000 mL | Freq: Two times a day (BID) | INTRAMUSCULAR | Status: DC

## 2013-07-05 MED ORDER — ACETAMINOPHEN 325 MG PO TABS: 650.0000 mg | ORAL_TABLET | ORAL | Status: DC | PRN

## 2013-07-05 MED ORDER — FENTANYL CITRATE 0.05 MG/ML IJ SOLN
INTRAMUSCULAR | Status: AC
  Filled 2013-07-05: qty 2

## 2013-07-05 MED ORDER — SODIUM CHLORIDE 0.9 % IV SOLN
INTRAVENOUS | Status: DC
  Administered 2013-07-05: 11:00:00 via INTRAVENOUS

## 2013-07-05 MED ORDER — HEPARIN (PORCINE) IN NACL 2-0.9 UNIT/ML-% IJ SOLN
INTRAMUSCULAR | Status: AC
  Filled 2013-07-05: qty 1000

## 2013-07-05 MED ORDER — NITROGLYCERIN 0.2 MG/ML ON CALL CATH LAB
INTRAVENOUS | Status: AC
  Filled 2013-07-05: qty 1

## 2013-07-05 NOTE — H&P (View-Only) (Signed)
Patient ID: [facility], female   DOB: [birth year], 52 y.o.   MRN: [id number] Cardiologist: Dr [name] Cancel   HPI:  [name] i s 52 year old with a history of NICM, HTN, and chronic systolic heart failure 99991111 (2009 EF 55%), anemic, and asthma. Multiple ED visits.   Last cath 2005 [facility] with no CAD and PA pressures in mid 40's  1. Pulmonary hypertension with pulmonary artery pressure of 49/23.  2. Mild to moderate coronary disease, medical treatment only. No high  grade stenoses.  3. Left ventricular dilatation with global hypokinesis and a calculated  ejection fraction of 30%. This is disproportionately low compared to  the mild coronary disease. It appears to be an idiopathic  cardiomyopathy and will be best managed with medical therapy.   CT of chest 03/18/13 no PE but prominent interstitial markings.  Admitted to [facility] 04/17/13 with increased dyspnea. Diuresed with IV lasix. She was evaluated in [facility] ED. Had been off all HF medications. She was restarted on carvedilol 3.125 mg bid lasix 20 mg daily, lisinopril 10 mg daily.  .  06/13/13 RHC  Mean RA: 20 mmHg  RV: 76/12 mmHg  PA: 75/36 mmHg with a mean of 49 mmHg  PCWP: 27 mmHg  Ao sat : 94% PA sat 50%  Fick Cardiac Output: 3.1 L/minute, 1.8 L/minute/M2  PVR: 8.0 wood unites 567 dysnes-sec cm5   She presents for follow up at the request of Dr [name] Cancel for pulmonary hypertension. She says she has been sob since March of this year. Complains dyspnea with exertion. Complains of lower extremity edema. She requires assistance with ADLs.She can only walk 1 block very slowly with multiple breaks. Weight at home 152-160 pounds. Compliant with medications. She has difficulty paying for medications. Medicaid pending.   ROS: All systems negative except as listed in HPI, PMH and Problem List.   Past Medical History   Diagnosis  Date   .  Hypertension    .  Hypercholesterolemia    .  Chronic systolic CHF (congestive heart failure)    .  Asthma     .  Nonischemic cardiomyopathy      a. 2005 Cath: nonobstructive dzs; b. 04/2013 Echo: EF 3035%, diff HK, restrictive physiology, mildly dil LA, PASP 15mmHg.   .  Microcytic anemia     Current Outpatient Prescriptions   Medication  Sig  Dispense  Refill   .  albuterol (PROVENTIL HFA;VENTOLIN HFA) 108 (90 BASE) MCG/ACT inhaler  Inhale 2 puffs into the lungs every 4 (four) hours as needed for wheezing or shortness of breath.     Marland Kitchen  albuterol (PROVENTIL HFA;VENTOLIN HFA) 108 (90 BASE) MCG/ACT inhaler  Inhale 1-2 puffs into the lungs every 6 (six) hours as needed for wheezing.  1 Inhaler  0   .  aspirin EC 81 MG EC tablet  Take 1 tablet (81 mg total) by mouth daily.     Marland Kitchen  atorvastatin (LIPITOR) 20 MG tablet  Take 1 tablet (20 mg total) by mouth daily at 6 PM.  30 tablet  6   .  carvedilol (COREG) 3.125 MG tablet  Take 1 tablet (3.125 mg total) by mouth 2 (two) times daily with a meal.  60 tablet  6   .  ferrous sulfate 325 (65 FE) MG tablet  Take 325 mg by mouth daily with breakfast.     .  furosemide (LASIX) 20 MG tablet  Take 1 tablet (20 mg total) by mouth 2 (two)  times daily.  60 tablet  6   .  HYDROcodone-acetaminophen (NORCO/VICODIN) 5-325 MG per tablet  Take 1 tablet by mouth every 4 (four) hours as needed for pain.  12 tablet  0   .  isosorbide dinitrate (ISORDIL) 5 MG SL tablet  2 TABS TID  180 tablet  12   .  lisinopril (PRINIVIL,ZESTRIL) 10 MG tablet  Take 1 tablet (10 mg total) by mouth daily.  30 tablet  0   .  nitroGLYCERIN (NITROSTAT) 0.4 MG SL tablet  Place 0.4 mg under the tongue every 5 (five) minutes as needed for chest pain.     .  potassium chloride (KLOR-CON) 20 MEQ packet  Take 20-40 mEq by mouth 2 (two) times daily. Take 2 tablets every morning and take 1 tablet every evening     .  spironolactone (ALDACTONE) 12.5 mg TABS  Take 0.5 tablets (12.5 mg total) by mouth daily.  30 tablet  6   .  metoCLOPramide (REGLAN) 10 MG tablet  Take 1 tablet (10 mg total) by mouth every 6 (six)  hours as needed (nausea/headache).  10 tablet  0   .  pantoprazole (PROTONIX) 40 MG tablet  Take 1 tablet (40 mg total) by mouth daily.  30 tablet  1   .  promethazine (PHENERGAN) 25 MG tablet  Take 1 tablet (25 mg total) by mouth every 6 (six) hours as needed for nausea.  12 tablet  0    No current facility-administered medications for this encounter.   PHYSICAL EXAM:  Filed Vitals:    06/17/13 1114   BP:  140/92   Pulse:  90   Weight:  152 lb (68.947 kg)   SpO2:  100%   General: Chronically ill appearing. No resp difficulty  HEENT: normal  Neck: supple. JVP to jaw. flat. Carotids 2+ bilaterally; no bruits. No lymphadenopathy or thryomegaly appreciated.  Cor: PMI normal. Regular rate & rhythm. No rubs, or murmurs. No S3  Lungs: clear  Abdomen: soft, nontender, nondistended. No hepatosplenomegaly. No bruits or masses. Good bowel sounds.  Extremities: no cyanosis, clubbing, rash, R and LLE 3+ edema to knees  Neuro: alert & orientedx3, cranial nerves grossly intact. Moves all 4 extremities w/o difficulty. Affect pleasant.   ASSESSMENT & PLAN:  1. Pulmonary HTN- Will need to manage volume first. Will need sleep study to r/o OSA. VQ to r/o chronic PE. Check ANn HIV, rheumatology serologies.  2. A/C Systolic Heart Failure NYHA IIIB. Volume status elevated. Increase lasix to 60 mg bid, increase spironolactone to 25 mg daily. Continue carvedilol 3.125 and lisinopril 10 mg daily.   Follow up in next week.   Darrick Grinder NP  06/17/13   Patient seen with NP, agree with the above note.  1. Pulmonary HTN: Combined pulmonary arterial and pulmonary venous HTN. There definitely is a component of PAH given PVR 8 WU. Etiology unclear, will need workup for causes of PAH. She had a recent CTA chest which did show some interstitial changes, cannot rule out an ILD pattern. She has class III symptoms.  - She will need therapy with pulmonary vasodilators but would like to see lower PCWP prior to initiation. She  also will need adenosine challenge. Would plan accentuated diuresis then repeat RHC with adenosine challenge => if PCWP lowered, begin pulmonary vasodilator (possibly macitentan).  - Will workup causes of PAH with sleep study, PFTs, V/Q scan for chronic PE, RF/ANA/HIV.  2. Chronic systolic CHF: EF 99991111 (decrease from prior  echo) with diffuse hypokinesis. Suspect nonischemic cardiomyopathy. Volume overloaded on exam with elevated PCWP at RHC.  - Continue Coreg 3.125 mg bid and lisinopril 10 mg daily  - Increase spironolactone to 25 mg daily.  - Increase Lasix to 60 mg po bid with KCl 40 bid.  - LHC with followup RHC to rule out CAD given new fall in EF.  - BMET and office followup in 1 week.   Loralie Champagne  06/17/2013  12:35 PM

## 2013-07-05 NOTE — Interval H&P Note (Signed)
History and Physical Interval Note:  07/05/2013 12:54 PM  [facility]  has presented today for surgery, with the diagnosis of chf  The various methods of treatment have been discussed with the patient and family. After consideration of risks, benefits and other options for treatment, the patient has consented to  Procedure(s): LEFT AND RIGHT HEART CATHETERIZATION WITH CORONARY ANGIOGRAM (N/A) as a surgical intervention .  The patient's history has been reviewed, patient examined, no change in status, stable for surgery.  I have reviewed the patient's chart and labs.  Questions were answered to the patient's satisfaction.     Ketih Goodie [employer]

## 2013-07-05 NOTE — CV Procedure (Signed)
   Cardiac Catheterization Procedure Note  Name: [name] MRN: [id number] DOB: 03-12-1961  Procedure: Right Heart Cath, Left Heart Cath, Selective Coronary Angiography, LV angiography, adenosine challenge  Indication: Pulmonary hypertension, cardiomyopathy   Procedural Details: The right groin was prepped, draped, and anesthetized with 1% lidocaine. Using the modified Seldinger technique a 5 French sheath was placed in the right femoral artery and a 7 French sheath was placed in the right femoral vein. A Swan-Ganz catheter was used for the right heart catheterization. Adenosine was infused at 50 mcg/kg/min, 100 mcg/kg/min, and 150 mcg/kg/min.  Standard protocol was followed for recording of right heart pressures and sampling of oxygen saturations. Fick cardiac output was calculated. Standard Judkins catheters were used for selective coronary angiography and left ventriculography. There were no immediate procedural complications. The patient was transferred to the post catheterization recovery area for further monitoring.  Procedural Findings: Hemodynamics (mmHg) RA mean 7 RV 44/11 PA 57/27, mean 40 PA (50 mcg/kg/min adenosine) 56/27, mean 41 PA (100 mcg/kg/min adenosine) 52/24, mean 37 PA (150 mcg/kg/min adenosine) 52/25, mean 38 Unable to increase to 200 mcg/kg/min due to pump problems PCWP mean 16 LV 127/21 AO 125/73  Oxygen saturations: PA 69% AO 93%  Cardiac Output (Fick) 4.21  Cardiac Index (Fick) 2.61  Cardiac Output (Thermo) 3.53 Cardiac Index (Thermo) 2.19  PVR (Fick): 5.7 WU PVR (Thermo): 6.8 WU   Coronary angiography: Coronary dominance: right  Left mainstem: No significant disease.   Left anterior descending (LAD): 50% mid LAD stenosis with distal tapering.   Left circumflex (LCx): Moderate early OM1 with 50% ostial stenosis. Mid LCx with 40% stenosis.   Right coronary artery (RCA): Diffuse RCA disease with 70-80% ostial stenosis and damping of the  waveform; serial 50% stenoses in the mid and distal RCA.   Left ventriculography: Global hypokinesis with EF estimated at 35%.  Final Conclusions:  Diffuse moderate CAD.  Most significant lesion is ostial RCA stenosis.  Reviewed with Dr. Angelena Form.  70-80% stenosis with ostial damping in setting of relatively small caliber RCA.  The RCA is diffusely disease.  In the absence of ACS (no chest pain), would treat medically.  Other coronary distributions have diffuse moderate disease. Continue ASA 81, increase atorvastatin to 40 mg daily.   Optimized PCWP on current dose of Lasix (16 mmHg).   Pulmonary arterial HTN with elevated PVR, not responsive to adenosine.  Given co-existing LV dysfunction, I am going to try her on sildenafil 20 mg tid.   She will followup in clinic in about 2 wks.   Loralie Champagne 07/05/2013, 1:55 PM

## 2013-07-29 ENCOUNTER — Ambulatory Visit (HOSPITAL_COMMUNITY)
Admission: RE | Admit: 2013-07-29 | Discharge: 2013-07-29 | Disposition: A | Discharge status: Home / Self Care | Payer: Medicaid Other | Source: Ambulatory Visit | Attending: Internal Medicine | Admitting: Internal Medicine

## 2013-07-29 VITALS — BP 144/82 | HR 70 | Wt 146.2 lb

## 2013-07-29 DIAGNOSIS — I5022 Chronic systolic (congestive) heart failure: Secondary | ICD-10-CM

## 2013-07-29 DIAGNOSIS — I428 Other cardiomyopathies: Secondary | ICD-10-CM | POA: Insufficient documentation

## 2013-07-29 DIAGNOSIS — I2510 Atherosclerotic heart disease of native coronary artery without angina pectoris: Secondary | ICD-10-CM

## 2013-07-29 DIAGNOSIS — D509 Iron deficiency anemia, unspecified: Secondary | ICD-10-CM | POA: Insufficient documentation

## 2013-07-29 DIAGNOSIS — I272 Pulmonary hypertension, unspecified: Secondary | ICD-10-CM

## 2013-07-29 DIAGNOSIS — I10 Essential (primary) hypertension: Secondary | ICD-10-CM | POA: Insufficient documentation

## 2013-07-29 DIAGNOSIS — E7800 Pure hypercholesterolemia, unspecified: Secondary | ICD-10-CM | POA: Insufficient documentation

## 2013-07-29 DIAGNOSIS — I2789 Other specified pulmonary heart diseases: Secondary | ICD-10-CM

## 2013-07-29 LAB — BASIC METABOLIC PANEL
BUN: 14 mg/dL (ref 6–23)
CO2: 27 mEq/L (ref 19–32)
Calcium: 10.4 mg/dL (ref 8.4–10.5)
Chloride: 99 mEq/L (ref 96–112)
Creatinine, Ser: 0.79 mg/dL (ref 0.50–1.10)
GFR calc Af Amer: >90 mL/min (ref >90)
GFR calc non Af Amer: >90 mL/min (ref >90)
Glucose, Bld: 116 mg/dL — ABNORMAL HIGH (ref 70–99)
Potassium: 4 mEq/L (ref 3.5–5.1)
Sodium: 138 mEq/L (ref 135–145)

## 2013-07-29 MED ORDER — CARVEDILOL 6.25 MG PO TABS: 6.2500 mg | ORAL_TABLET | Freq: Two times a day (BID) | ORAL | Status: DC

## 2013-07-29 NOTE — Progress Notes (Signed)
Patient ID: [name], female   DOB: [birth year], 52 y.o.   MRN: [id number] Cardiologist: [name]   HPI:  [name] is a 52 yo with a history of NICM, HTN, and chronic systolic heart failure 99991111 (2009 EF 55%), anemia, and asthma.   Last cath 2005 [facility] with no CAD and PA pressures in mid 40's   CT of chest 03/18/13 no PE but prominent interstitial markings. VQ 7/15//14 Normal PFTs done 7/14 - report not available ANA, HIV and RF negative  Admitted to [name] 04/17/13 through 04/19/13 with increased dyspnea. Diuresed with IV lasix. D/c weight 160. She was re-evaluated in [name] ED on 6/13. Had been off all HF medications. She was restarted on carvedilol 3.125 mg bid lasix 20 mg daily, lisinopril 10 mg daily and referred to Teresa. [name] for further evaluation. He performed outpatient RHC and referred her to HF Clinic for further evaluation of PH. .  06/13/13 RHC  Mean RA: 20 mmHg  RV: 76/12 mmHg  PA: 75/36 mmHg with a mean of 49 mmHg  PCWP: 27 mmHg  Ao sat : 94% PA sat 50%  Fick Cardiac Output: 3.1 L/minute, 1.8 L/minute/M2  PVR: 8.0 wood units 567 dysnes-sec cm5   07/05/13 R/LHC RA mean 7  RV 44/11  PA 57/27, mean 40  PA (50 mcg/kg/min adenosine) 56/27, mean 41  PA (100 mcg/kg/min adenosine) 52/24, mean 37  PA (150 mcg/kg/min adenosine) 52/25, mean 38  Unable to increase to 200 mcg/kg/min due to pump problems  PCWP mean 16  LV 127/21  AO 125/73  Oxygen saturations:  PA 69%  AO 93%  Cardiac Output (Fick) 4.21  Cardiac Index (Fick) 2.61  Cardiac Output (Thermo) 3.53  Cardiac Index (Thermo) 2.19  PVR (Fick): 5.7 WU  PVR (Thermo): 6.8 WU  Coronary angiography:  Coronary dominance: right  Left mainstem: No significant disease.  Left anterior descending (LAD): 50% mid LAD stenosis with distal tapering.  Left circumflex (LCx): Moderate early OM1 with 50% ostial stenosis. Mid LCx with 40% stenosis.  Right coronary artery (RCA): Diffuse RCA disease with 70-80% ostial stenosis and  damping of the waveform; serial 50% stenoses in the mid and distal RCA.  Left ventriculography: Global hypokinesis with EF estimated at 35%.  Final Conclusions: Diffuse moderate CAD. Most significant lesion is ostial RCA stenosis. Reviewed with [name]. 70-80% stenosis with ostial damping in setting of relatively small caliber RCA. The RCA is diffusely disease. In the absence of ACS (no chest pain), would treat medically. Other coronary distributions have diffuse moderate disease. Continue ASA 81, increase atorvastatin to 40 mg daily.   Saw Teresa. [name] who felt like she might have OSA but she couldn't afford test.   Follow up: After last heart cath she was started on sildenafil 20 mg TID. Hasn't gotten it yet due to cost. Says overall she feels very good after getting back on her HF meds. Denies dyspnea. Orthopnea or PND.  Weighs every day.   Ambulated around the hall and O2 sats 99%.   ROS: All systems negative except as listed in HPI, PMH and Problem List.   Past Medical History   Diagnosis  Date   .  Hypertension    .  Hypercholesterolemia    .  Chronic systolic CHF (congestive heart failure)    .  Asthma    .  Nonischemic cardiomyopathy      a. 2005 Cath: nonobstructive dzs; b. 04/2013 Echo: EF 3035%, diff HK, restrictive physiology, mildly  dil LA, PASP 37mmHg.   .  Microcytic anemia     Current Outpatient Prescriptions on File Prior to Encounter  Medication Sig Dispense Refill  . albuterol (PROVENTIL HFA;VENTOLIN HFA) 108 (90 BASE) MCG/ACT inhaler Inhale 2 puffs into the lungs every 4 (four) hours as needed for wheezing or shortness of breath.      Marland Kitchen aspirin EC 81 MG EC tablet Take 1 tablet (81 mg total) by mouth daily.      Marland Kitchen atorvastatin (LIPITOR) 20 MG tablet Take 1 tablet (20 mg total) by mouth daily at 6 PM.  30 tablet  6  . carvedilol (COREG) 3.125 MG tablet Take 1 tablet (3.125 mg total) by mouth 2 (two) times daily with a meal.  60 tablet  6  . ferrous sulfate 325 (65 FE)  MG tablet Take 325 mg by mouth daily with breakfast.      . furosemide (LASIX) 20 MG tablet Take 4 tablets (80 mg total) by mouth 2 (two) times daily.  120 tablet  3  . HYDROcodone-acetaminophen (NORCO/VICODIN) 5-325 MG per tablet Take 1 tablet by mouth every 4 (four) hours as needed for pain.      Marland Kitchen lisinopril (PRINIVIL,ZESTRIL) 10 MG tablet Take 1 tablet (10 mg total) by mouth daily.  30 tablet  0  . metoCLOPramide (REGLAN) 10 MG tablet Take 1 tablet (10 mg total) by mouth every 6 (six) hours as needed (nausea/headache).  10 tablet  0  . nitroGLYCERIN (NITROSTAT) 0.4 MG SL tablet Place 0.4 mg under the tongue every 5 (five) minutes as needed for chest pain.      . pantoprazole (PROTONIX) 40 MG tablet Take 1 tablet (40 mg total) by mouth daily.  30 tablet  1  . potassium chloride SA (K-DUR,KLOR-CON) 20 MEQ tablet Take 2 tablets in the morning and 1 tablet in the evening      . promethazine (PHENERGAN) 25 MG tablet Take 1 tablet (25 mg total) by mouth every 6 (six) hours as needed for nausea.  12 tablet  0  . spironolactone (ALDACTONE) 25 MG tablet Take 25 mg by mouth daily.       No current facility-administered medications on file prior to encounter.   BP 144/82  Pulse 70  Wt 146 lb 4 oz (66.339 kg)  BMI 28.56 kg/m2  General: Well appearing. No resp difficulty  HEENT: normal  Neck: supple. JVP flat. Carotids 2+ bilaterally; no bruits. No lymphadenopathy or thryomegaly appreciated.  Cor: PMI normal. Regular rate & rhythm. 2/6 TR Lungs: clear  Abdomen: soft, nontender, nondistended. No hepatosplenomegaly. No bruits or masses. Good bowel sounds.  Extremities: no cyanosis, clubbing, rash, no edema Neuro: alert & orientedx3, cranial nerves grossly intact. Moves all 4 extremities w/o difficulty. Affect pleasant.   ASSESSMENT & PLAN:  1. Chronic systolic HF 2. PAH with abnormal chest CT 3. HTN 4. CAD with ostial 70-80% RCA lesion   From a HF standpoint she is doing great. Looks euvolemic  and probably NYHA I. Will titrate carvedilol to 6.25 bid. Reinforced need for daily weights and reviewed use of sliding scale diuretics. I am still unsure of what is causing her PAH. I do think a sleep study would be informative. Given findings on CT, I also wonder it there could be a component of PVOD. Will review CT with Teresa. Rosario Jacks. Will also ANCA serologies and anti-basement abs. She will try to get the sildenafil if she can afford and see if it will help. Continue to  manage CAD medically. Check echo in 2 months if EF > 35% and NYHA II or greater will consider ICD.   Return to clinic in 4 weeks.   Teresa Rochin,MD 10:06 AM

## 2013-07-29 NOTE — Addendum Note (Signed)
Encounter addended by: Scarlette Calico, RN on: 07/29/2013 10:19 AM<BR>     Documentation filed: Orders

## 2013-07-29 NOTE — Addendum Note (Signed)
Encounter addended by: Scarlette Calico, RN on: 07/29/2013 10:14 AM<BR>     Documentation filed: Patient Instructions Section, Orders

## 2013-07-29 NOTE — Patient Instructions (Addendum)
Increase Carvedilol to 6.25 mg Twice daily   Labs today  Your physician recommends that you schedule a follow-up appointment in: 1 month  

## 2013-07-30 LAB — ANCA SCREEN W REFLEX TITER: Atypical p-ANCA Screen: NEGATIVE

## 2013-07-30 LAB — GLOMERULAR BASEMENT MEMBRANE ANTIBODIES: GBM Ab: 1 AU/mL (ref <20)

## 2013-07-30 LAB — HEPATITIS PANEL, ACUTE: Hep A IgM: NEGATIVE

## 2013-08-22 ENCOUNTER — Telehealth (HOSPITAL_COMMUNITY): Payer: Self-pay | Admitting: *Deleted

## 2013-08-22 MED ORDER — SILDENAFIL CITRATE 20 MG PO TABS: 20.0000 mg | ORAL_TABLET | Freq: Three times a day (TID) | ORAL | Status: DC

## 2013-08-22 NOTE — Telephone Encounter (Signed)
[name] called to let us know that pt was approved for Revatio assistance program from 08/16/13-08/16/14, pt aware and states she has already received medication in the mail

## 2013-08-29 ENCOUNTER — Telehealth (HOSPITAL_COMMUNITY): Payer: Self-pay | Admitting: Anesthesiology

## 2013-08-29 ENCOUNTER — Encounter (HOSPITAL_COMMUNITY): Payer: Self-pay

## 2013-08-29 ENCOUNTER — Ambulatory Visit (HOSPITAL_COMMUNITY)
Admission: RE | Admit: 2013-08-29 | Discharge: 2013-08-29 | Disposition: A | Discharge status: Home / Self Care | Payer: Medicaid Other | Source: Ambulatory Visit | Attending: Cardiology | Admitting: Cardiology

## 2013-08-29 VITALS — BP 102/56 | HR 75 | Wt 147.8 lb

## 2013-08-29 DIAGNOSIS — E7800 Pure hypercholesterolemia, unspecified: Secondary | ICD-10-CM | POA: Insufficient documentation

## 2013-08-29 DIAGNOSIS — I272 Pulmonary hypertension, unspecified: Secondary | ICD-10-CM

## 2013-08-29 DIAGNOSIS — I428 Other cardiomyopathies: Secondary | ICD-10-CM | POA: Insufficient documentation

## 2013-08-29 DIAGNOSIS — I5022 Chronic systolic (congestive) heart failure: Secondary | ICD-10-CM | POA: Insufficient documentation

## 2013-08-29 DIAGNOSIS — I2789 Other specified pulmonary heart diseases: Secondary | ICD-10-CM

## 2013-08-29 DIAGNOSIS — I2510 Atherosclerotic heart disease of native coronary artery without angina pectoris: Secondary | ICD-10-CM | POA: Insufficient documentation

## 2013-08-29 LAB — BASIC METABOLIC PANEL WITH GFR
BUN: 16 mg/dL (ref 6–23)
CO2: 25 meq/L (ref 19–32)
Calcium: 10.2 mg/dL (ref 8.4–10.5)
Chloride: 97 meq/L (ref 96–112)
Creatinine, Ser: 1.2 mg/dL — ABNORMAL HIGH (ref 0.50–1.10)
GFR calc Af Amer: 59 mL/min — ABNORMAL LOW (ref >90)
GFR calc non Af Amer: 51 mL/min — ABNORMAL LOW (ref >90)
Glucose, Bld: 105 mg/dL — ABNORMAL HIGH (ref 70–99)
Potassium: 4.5 meq/L (ref 3.5–5.1)
Sodium: 137 meq/L (ref 135–145)

## 2013-08-29 LAB — PRO B NATRIURETIC PEPTIDE: Pro B Natriuretic peptide (BNP): 449.5 pg/mL — ABNORMAL HIGH (ref 0–125)

## 2013-08-29 MED ORDER — SILDENAFIL CITRATE 20 MG PO TABS: 40.0000 mg | ORAL_TABLET | Freq: Three times a day (TID) | ORAL | Status: DC

## 2013-08-29 MED ORDER — FUROSEMIDE 20 MG PO TABS: 40.0000 mg | ORAL_TABLET | Freq: Two times a day (BID) | ORAL | Status: DC

## 2013-08-29 MED ORDER — ATORVASTATIN CALCIUM 40 MG PO TABS: 40.0000 mg | ORAL_TABLET | Freq: Every day | ORAL | Status: DC

## 2013-08-29 NOTE — Patient Instructions (Addendum)
Increase atorvastatin to 40 mg daily.  Increase Revatio (sildenafil) to 40 mg TID.   Will get appointment with pulmonologist for sleep study.  Will call about lab results.  Get lipid profile at [facility] Tuesday after fasting at least 8 hrs.

## 2013-08-29 NOTE — Progress Notes (Signed)
6 min walk test completed, pt ambulated 1340 ft (408 m) o2 sat ranged from 96-98%

## 2013-08-29 NOTE — Progress Notes (Signed)
Patient ID: [facility], female   DOB: [birth year], 52 y.o.   MRN: [id number] Cardiologist: Dr [name] Cancel ( does not see anymore) PCP: Dr. Vista Lawman  HPI:  Ms Lennen is a 52 yo with a history of NICM, HTN, and chronic systolic heart failure 99991111 (2009 EF 55%), anemia, and asthma.   Last cath 2005 Al Little with no CAD and PA pressures in mid 40's   CT of chest 03/18/13 no PE but prominent interstitial pattern. VQ 07/02/13 Normal PFTs done 7/14 - FVC 2.1, FEV1 1.5 FEV1/FVC 72, DLCO 12.01 (restrictive picture).  ANA, HIV, ANCA, anti-GBM Ab and RF negative  Admitted to [facility] 04/17/13 through 04/19/13 with increased dyspnea. Diuresed with IV lasix. D/c weight 160. She was re-evaluated in [facility] ED on 6/13. Had been off all HF medications. She was restarted on carvedilol 3.125 mg bid lasix 20 mg daily, lisinopril 10 mg daily and referred to Dr. [name] Cancel for further evaluation. He performed outpatient RHC and referred her to HF Clinic for further evaluation of PH. .  06/13/13 RHC  Mean RA: 20 mmHg  RV: 76/12 mmHg  PA: 75/36 mmHg with a mean of 49 mmHg  PCWP: 27 mmHg  Ao sat : 94% PA sat 50%  Fick Cardiac Output: 3.1 L/minute, 1.8 L/minute/M2  PVR: 8.0 wood units 567 dysnes-sec cm5   07/05/13 R/LHC RA mean 7  RV 44/11  PA 57/27, mean 40  PA (50 mcg/kg/min adenosine) 56/27, mean 41  PA (100 mcg/kg/min adenosine) 52/24, mean 37  PA (150 mcg/kg/min adenosine) 52/25, mean 38  Unable to increase to 200 mcg/kg/min due to pump problems  PCWP mean 16  LV 127/21  AO 125/73  Oxygen saturations:  PA 69%  AO 93%  Cardiac Output (Fick) 4.21  Cardiac Index (Fick) 2.61  Cardiac Output (Thermo) 3.53  Cardiac Index (Thermo) 2.19  PVR (Fick): 5.7 WU  PVR (Thermo): 6.8 WU  Coronary angiography:  Coronary dominance: right  Left mainstem: No significant disease.  Left anterior descending (LAD): 50% mid LAD stenosis with distal tapering.  Left circumflex (LCx): Moderate early OM1 with 50% ostial stenosis. Mid  LCx with 40% stenosis.  Right coronary artery (RCA): Diffuse RCA disease with 70-80% ostial stenosis and damping of the waveform; serial 50% stenoses in the mid and distal RCA.  Left ventriculography: Global hypokinesis with EF estimated at 35%.  Final Conclusions: Diffuse moderate CAD. Most significant lesion is ostial RCA stenosis. Reviewed with Dr. Angelena Form. 70-80% stenosis with ostial damping in setting of relatively small caliber RCA. The RCA is diffusely disease. In the absence of ACS (no chest pain), would treat medically. Other coronary distributions have diffuse moderate disease.   Follow up: Last visit increased coreg to 54 mg BID. Could not afford sleep study test in the past, but recently got approved for Medicaid. Started on sildenafil 20 mg TID last week. Feeling much better since starting medications and following with clinic. Now reports no SOB and can walk around the whole grocery store without stopping. Denies orthopnea, edema or CP. Following a low salt diet and restricting fluids to less than 2 Liters a day. Denies dizziness and headaches.   6 minute walk today: [place] (8/14): K 4, creatinine 0.79  SH: single, lives in [place], nonsmoker.   FH:  No premature CAD.   ROS: All systems negative except as listed in HPI, PMH and Problem List.   Past Medical History  Diagnosis Date  . Hypertension   . Hypercholesterolemia   .  Chronic systolic CHF (congestive heart failure)   . Asthma   . Nonischemic cardiomyopathy     a. 2005 Cath: nonobstructive dzs;  b. 04/2013 Echo: EF 3035%, diff HK, restrictive physiology, mildly dil LA, PASP 53mmHg.  . Microcytic anemia     Current Outpatient Prescriptions on File Prior to Encounter  Medication Sig Dispense Refill  . albuterol (PROVENTIL HFA;VENTOLIN HFA) 108 (90 BASE) MCG/ACT inhaler Inhale 2 puffs into the lungs every 4 (four) hours as needed for wheezing or shortness of breath.      Marland Kitchen aspirin EC 81 MG EC tablet Take 1  tablet (81 mg total) by mouth daily.      Marland Kitchen atorvastatin (LIPITOR) 20 MG tablet Take 1 tablet (20 mg total) by mouth daily at 6 PM.  30 tablet  6  . carvedilol (COREG) 6.25 MG tablet Take 1 tablet (6.25 mg total) by mouth 2 (two) times daily with a meal.  60 tablet  6  . ferrous sulfate 325 (65 FE) MG tablet Take 325 mg by mouth daily with breakfast.      . furosemide (LASIX) 20 MG tablet Take 4 tablets (80 mg total) by mouth 2 (two) times daily.  120 tablet  3  . HYDROcodone-acetaminophen (NORCO/VICODIN) 5-325 MG per tablet Take 1 tablet by mouth every 4 (four) hours as needed for pain.      Marland Kitchen lisinopril (PRINIVIL,ZESTRIL) 10 MG tablet Take 1 tablet (10 mg total) by mouth daily.  30 tablet  0  . potassium chloride SA (K-DUR,KLOR-CON) 20 MEQ tablet Take 2 tablets in the morning and 1 tablet in the evening      . sildenafil (REVATIO) 20 MG tablet Take 1 tablet (20 mg total) by mouth 3 (three) times daily.  10 tablet  0  . spironolactone (ALDACTONE) 25 MG tablet Take 25 mg by mouth daily.       No current facility-administered medications on file prior to encounter.    Filed Vitals:   08/29/13 1340  BP: 102/56  Pulse: 75  Weight: 147 lb 12.8 oz (67.042 kg)  SpO2: 98%   PHYSICAL EXAM: General: Well appearing. No resp difficulty  HEENT: normal  Neck: supple. JVP flat. Carotids 2+ bilaterally; no bruits. No lymphadenopathy or thryomegaly appreciated.  Cor: PMI normal. Regular rate & rhythm. 2/6 TR Lungs: clear  Abdomen: soft, nontender, nondistended. No hepatosplenomegaly. No bruits or masses. Good bowel sounds.  Extremities: no cyanosis, clubbing, rash, no edema Neuro: alert & orientedx3, cranial nerves grossly intact. Moves all 4 extremities w/o difficulty. Affect pleasant.   ASSESSMENT & PLAN:  1. Chronic systolic HF: EF 99991111.  Diffuse moderate RCA disease but I do not think this explains her cardiomyopathy.  Ultimate cause of cardiomyopathy at this point is uncertain (suspect  nonischemic).  NYHA class I-II.  Not volume overloaded on exam.  - Continue Coreg, lisinopril, and spironolactone at current doses.  - BMET/BNP today.  - Repeat echo to reassess LV function in 10/14.  - Can continue current Lasix and KCl for now, may be able to decrease doses in the near future.  2. PAH: PVR 5.7 WU on RHC, not reactive to adenosine.  She was started on sildenafil 20 mg tid.  She has done well since that time.  6 minute walk today with 408 m (excellent).  Workup so far with no evidence for collagen vascular disease.  V/Q scan normal.  PFTs with restrictive picture and some concern for possible interstitial lung disease on prior chest  CT.   - Needs sleep study and interstitial lung disease evaluation, will refer back to pulmonary now that she has her Medicaid.  - Increase sildenafil to 40 mg tid.  Will leave at this dose if she continues to do well symptomatically.  3. CAD: Ostial 70-80% RCA lesion.  No chest pain.  For now, will treat medically.  I do not think that this caused her cardiomyopathy.  Continue ASA 81 and statin.   4. Hyperlipidemia: Continue statin, will have her get lipids.   Followup in 6 wks.   Loralie Champagne 08/29/2013 3:50 PM

## 2013-08-30 MED ORDER — POTASSIUM CHLORIDE CRYS ER 20 MEQ PO TBCR: 10.0000 meq | EXTENDED_RELEASE_TABLET | Freq: Every day | ORAL | Status: DC

## 2013-08-30 NOTE — Telephone Encounter (Signed)
Called patient about labs 08/29/13 K+ 4.5 and creatinine elevated slightly 1.2. Will cut potassium back to 10 meq daily and lasix to 40 mg BID Patient aware

## 2013-09-03 ENCOUNTER — Other Ambulatory Visit (INDEPENDENT_AMBULATORY_CARE_PROVIDER_SITE_OTHER): Payer: Medicaid Other

## 2013-09-03 DIAGNOSIS — I5022 Chronic systolic (congestive) heart failure: Secondary | ICD-10-CM

## 2013-09-03 LAB — LIPID PANEL
Cholesterol: 198 mg/dL (ref 0–200)
LDL Cholesterol: 132 mg/dL — ABNORMAL HIGH (ref 0–99)
Triglycerides: 93 mg/dL (ref 0.0–149.0)
VLDL: 18.6 mg/dL (ref 0.0–40.0)

## 2013-09-26 ENCOUNTER — Other Ambulatory Visit (HOSPITAL_COMMUNITY): Payer: Self-pay | Admitting: Adult Health

## 2013-09-30 ENCOUNTER — Ambulatory Visit (HOSPITAL_BASED_OUTPATIENT_CLINIC_OR_DEPARTMENT_OTHER)
Admission: RE | Admit: 2013-09-30 | Discharge: 2013-09-30 | Disposition: A | Discharge status: Home / Self Care | Payer: Medicaid Other | Source: Ambulatory Visit | Attending: Gastroenterology | Admitting: Gastroenterology

## 2013-09-30 ENCOUNTER — Ambulatory Visit (HOSPITAL_COMMUNITY)
Admission: RE | Admit: 2013-09-30 | Discharge: 2013-09-30 | Disposition: A | Discharge status: Home / Self Care | Payer: Medicaid Other | Source: Ambulatory Visit | Attending: Gastroenterology | Admitting: Gastroenterology

## 2013-09-30 ENCOUNTER — Encounter (HOSPITAL_COMMUNITY): Payer: Self-pay

## 2013-09-30 ENCOUNTER — Telehealth (HOSPITAL_COMMUNITY): Payer: Self-pay | Admitting: Adult Health

## 2013-09-30 VITALS — BP 144/80 | HR 70 | Wt 156.8 lb

## 2013-09-30 DIAGNOSIS — I2789 Other specified pulmonary heart diseases: Secondary | ICD-10-CM | POA: Insufficient documentation

## 2013-09-30 DIAGNOSIS — I5022 Chronic systolic (congestive) heart failure: Secondary | ICD-10-CM

## 2013-09-30 DIAGNOSIS — I272 Pulmonary hypertension, unspecified: Secondary | ICD-10-CM

## 2013-09-30 DIAGNOSIS — I517 Cardiomegaly: Secondary | ICD-10-CM

## 2013-09-30 LAB — BASIC METABOLIC PANEL
BUN: 13 mg/dL (ref 6–23)
Creatinine, Ser: 1.14 mg/dL — ABNORMAL HIGH (ref 0.50–1.10)
GFR calc non Af Amer: 54 mL/min — ABNORMAL LOW (ref >90)
Glucose, Bld: 99 mg/dL (ref 70–99)
Potassium: 5.4 mEq/L — ABNORMAL HIGH (ref 3.5–5.1)

## 2013-09-30 MED ORDER — ATORVASTATIN CALCIUM 40 MG PO TABS: 40.0000 mg | ORAL_TABLET | Freq: Every day | ORAL | Status: DC

## 2013-09-30 MED ORDER — FUROSEMIDE 20 MG PO TABS: 40.0000 mg | ORAL_TABLET | Freq: Two times a day (BID) | ORAL | Status: DC

## 2013-09-30 MED ORDER — POTASSIUM CHLORIDE CRYS ER 20 MEQ PO TBCR: 40.0000 meq | EXTENDED_RELEASE_TABLET | Freq: Every day | ORAL | Status: DC

## 2013-09-30 MED ORDER — SILDENAFIL CITRATE 20 MG PO TABS: 40.0000 mg | ORAL_TABLET | Freq: Three times a day (TID) | ORAL | Status: DC

## 2013-09-30 MED ORDER — CARVEDILOL 6.25 MG PO TABS: 9.3750 mg | ORAL_TABLET | Freq: Two times a day (BID) | ORAL | Status: DC

## 2013-09-30 MED ORDER — LISINOPRIL 10 MG PO TABS: 10.0000 mg | ORAL_TABLET | Freq: Every day | ORAL | Status: DC

## 2013-09-30 MED ORDER — SPIRONOLACTONE 25 MG PO TABS: 25.0000 mg | ORAL_TABLET | Freq: Every day | ORAL | Status: DC

## 2013-09-30 NOTE — Telephone Encounter (Signed)
Provided with lab results . K 5.4   Instructed to stop potassium and will repeat BMET on 10/04/13.   [name] verbalized understanding. Tamiya Colello 4:19 PM

## 2013-09-30 NOTE — Progress Notes (Signed)
Patient ID: [facility], female   DOB: July 04, 1961, 52 y.o.   MRN: [id number] Cardiologist: Dr [name] Cancel ( does not see anymore) PCP: Dr. Vista Lawman  HPI:  Ms Helman is a 52 yo with a history of NICM, HTN, and chronic systolic heart failure 99991111 (2009 EF 55%), anemia, and asthma.   Last cath 2005 Al [place] with no CAD and PA pressures in mid 40's   CT of chest 03/18/13 no PE but prominent interstitial pattern. VQ 07/02/13 Normal PFTs done 7/14 - FVC 2.1, FEV1 1.5 FEV1/FVC 72, DLCO 12.01 (restrictive picture).  ANA, HIV, ANCA, anti-GBM Ab and RF negative  Admitted to [facility] 04/17/13 through 04/19/13 with increased dyspnea. Diuresed with IV lasix. D/c weight 160. She was re-evaluated in [facility] ED on 6/13. Had been off all HF medications. She was restarted on carvedilol 3.125 mg bid lasix 20 mg daily, lisinopril 10 mg daily and referred to Dr. [name] Cancel for further evaluation. He performed outpatient RHC and referred her to HF Clinic for further evaluation of PH. .  06/13/13 RHC  Mean RA: 20 mmHg  RV: 76/12 mmHg  PA: 75/36 mmHg with a mean of 49 mmHg  PCWP: 27 mmHg  Ao sat : 94% PA sat 50%  Fick Cardiac Output: 3.1 L/minute, 1.8 L/minute/M2  PVR: 8.0 wood units 567 dysnes-sec cm5   07/05/13 R/LHC RA mean 7  RV 44/11  PA 57/27, mean 40  PA (50 mcg/kg/min adenosine) 56/27, mean 41  PA (100 mcg/kg/min adenosine) 52/24, mean 37  PA (150 mcg/kg/min adenosine) 52/25, mean 38  Unable to increase to 200 mcg/kg/min due to pump problems  PCWP mean 16  LV 127/21  AO 125/73  Oxygen saturations:  PA 69%  AO 93%  Cardiac Output (Fick) 4.21  Cardiac Index (Fick) 2.61  Cardiac Output (Thermo) 3.53  Cardiac Index (Thermo) 2.19  PVR (Fick): 5.7 WU  PVR (Thermo): 6.8 WU  Coronary angiography:  Coronary dominance: right  Left mainstem: No significant disease.  Left anterior descending (LAD): 50% mid LAD stenosis with distal tapering.  Left circumflex (LCx): Moderate early OM1 with 50% ostial stenosis. Mid  LCx with 40% stenosis.  Right coronary artery (RCA): Diffuse RCA disease with 70-80% ostial stenosis and damping of the waveform; serial 50% stenoses in the mid and distal RCA.  Left ventriculography: Global hypokinesis with EF estimated at 35%.  Final Conclusions: Diffuse moderate CAD. Most significant lesion is ostial RCA stenosis. Reviewed with Dr. Angelena Form. 70-80% stenosis with ostial damping in setting of relatively small caliber RCA. The RCA is diffusely disease. In the absence of ACS (no chest pain), would treat medically. Other coronary distributions have diffuse moderate disease.   08/29/13 - 6 minute walk today: [place] (07/29/13 ): K 4, creatinine 0.79 Labs (9/11/ 14 ): K 4.5 Creatinine 1.2  Labs (09/03/13) : Cholesterol 198 Triglycerides 93 HDL 47  She returns for follow up. Last visit sildenafil increased to 40 mg tid. Overall feeling much better. Denies SOB/PND/Orthopnea. Much more active without almost no limitations.  Pulmonology referral 10/08/13. Compliant with medications.   ECHO10/13/14 EF ~45-50% RV normal no significant TR.   SH: single, lives in [place], nonsmoker.   FH:  No premature CAD.   ROS: All systems negative except as listed in HPI, PMH and Problem List.   Past Medical History  Diagnosis Date  . Hypertension   . Hypercholesterolemia   . Chronic systolic CHF (congestive heart failure)   . Asthma   . Nonischemic cardiomyopathy  a. 2005 Cath: nonobstructive dzs;  b. 04/2013 Echo: EF 3035%, diff HK, restrictive physiology, mildly dil LA, PASP 36mmHg.  . Microcytic anemia     Current Outpatient Prescriptions on File Prior to Encounter  Medication Sig Dispense Refill  . albuterol (PROVENTIL HFA;VENTOLIN HFA) 108 (90 BASE) MCG/ACT inhaler Inhale 2 puffs into the lungs every 4 (four) hours as needed for wheezing or shortness of breath.      Marland Kitchen aspirin EC 81 MG EC tablet Take 1 tablet (81 mg total) by mouth daily.      Marland Kitchen atorvastatin (LIPITOR) 40 MG  tablet Take 1 tablet (40 mg total) by mouth daily at 6 PM.  30 tablet  6  . carvedilol (COREG) 6.25 MG tablet Take 1 tablet (6.25 mg total) by mouth 2 (two) times daily with a meal.  68 tablet  2  . ferrous sulfate 325 (65 FE) MG tablet Take 325 mg by mouth daily with breakfast.      . furosemide (LASIX) 20 MG tablet Take 2 tablets (40 mg total) by mouth 2 (two) times daily.  120 tablet  2  . HYDROcodone-acetaminophen (NORCO/VICODIN) 5-325 MG per tablet Take 1 tablet by mouth every 4 (four) hours as needed for pain.      Marland Kitchen lisinopril (PRINIVIL,ZESTRIL) 10 MG tablet TAKE 1 TABLET BY MOUTH ONCE DAILY  34 tablet  2  . potassium chloride SA (K-DUR,KLOR-CON) 20 MEQ tablet TAKE 2 TABLETS BY MOUTH IN THE MORNING AND 1 TABLET IN THE EVENING  102 tablet  2  . sildenafil (REVATIO) 20 MG tablet Take 2 tablets (40 mg total) by mouth 3 (three) times daily.  540 tablet  3  . spironolactone (ALDACTONE) 25 MG tablet Take 1 tablet (25 mg total) by mouth daily.  32 tablet  2   No current facility-administered medications on file prior to encounter.    Filed Vitals:   09/30/13 1406  BP: 144/80  Pulse: 70  Weight: 156 lb 12.8 oz (71.124 kg)  SpO2: 100%   PHYSICAL EXAM: General: Well appearing. No resp difficulty  HEENT: normal  Neck: supple. JVP flat. Carotids 2+ bilaterally; no bruits. No lymphadenopathy or thryomegaly appreciated.  Cor: PMI normal. Regular rate & rhythm. 2/6 TR Lungs: clear  Abdomen: soft, nontender, nondistended. No hepatosplenomegaly. No bruits or masses. Good bowel sounds.  Extremities: no cyanosis, clubbing, rash, no edema Neuro: alert & orientedx3, cranial nerves grossly intact. Moves all 4 extremities w/o difficulty. Affect pleasant.   ASSESSMENT & PLAN:  1. Chronic systolic HF: She is much improved. Dr Haroldine Laws reviewed and discussed ECHO. EF 45-50% RV normal no significant TR. Currentl NYHA class I-II.  Not volume overloaded on exam.  - Continue Coreg, lisinopril, and  spironolactone at current doses.  - Can continue current Lasix and KCl for now, may be able to decrease doses in the near future.  2. PAH: PVR 5.7 WU on RHC, not reactive to adenosine. Doing well on sildenafil 40 mg tid. Workup so far with no evidence for collagen vascular disease.  V/Q scan normal.  PFTs with restrictive picture and some concern for possible interstitial lung disease on prior chest CT.   - Pulmonology referral scheduled next week.   3. CAD: Ostial 70-80% RCA lesion.  No chest pain.  For now, will treat medically.  I do not think that this caused her cardiomyopathy.  Continue ASA 81 and statin.   4. Hyperlipidemia: Continue statin, will have her get lipids.   Followup in 6  wks.   CLEGG,AMY 09/30/2013 2:23 PM  Patient seen and examined with Darrick Grinder, NP. We discussed all aspects of the encounter. I agree with the assessment and plan as stated above.  She is much improved with medical therapy. Now NYHA I-II. ECHO (reviewed personally) with EF 45-50%. RV normal. No evidence PAH by echo. Suspect interstitial pattern on CT might have been related to edema. Would consider repeating CT with hi-RES cuts now that she is dry. However she has f/u with Pulmonary soon and I will defer to their judgement. Restriction on PFTs likely due to habitus. Will continue current rx for now except for titration of carvedilol. Reinforced need for daily weights and reviewed use of sliding scale diuretics. CAD is stable. Continue ASA/statin.   Daniel Bensimhon,MD 12:40 AM

## 2013-09-30 NOTE — Progress Notes (Signed)
*  PRELIMINARY RESULTS* Echocardiogram 2D Echocardiogram has been performed.  Leavy Cella 09/30/2013, 2:12 PM

## 2013-09-30 NOTE — Patient Instructions (Signed)
Follow up in 2 months  Do the following things EVERYDAY: 1) Weigh yourself in the morning before breakfast. Write it down and keep it in a log. 2) Take your medicines as prescribed 3) Eat low salt foods-Limit salt (sodium) to 2000 mg per day.  4) Stay as active as you can everyday 5) Limit all fluids for the day to less than 2 liters 

## 2013-10-01 IMAGING — CR DG ABDOMEN ACUTE W/ 1V CHEST
3 series · 3 of 3 positions shown · non-contrast
Comparison: Chest x-ray 04/24/2013.

CLINICAL DATA: Abdominal pain.  Shortness of breath.

ACUTE ABDOMEN SERIES (ABDOMEN 2 VIEW & CHEST 1 VIEW)

[w chest pa]
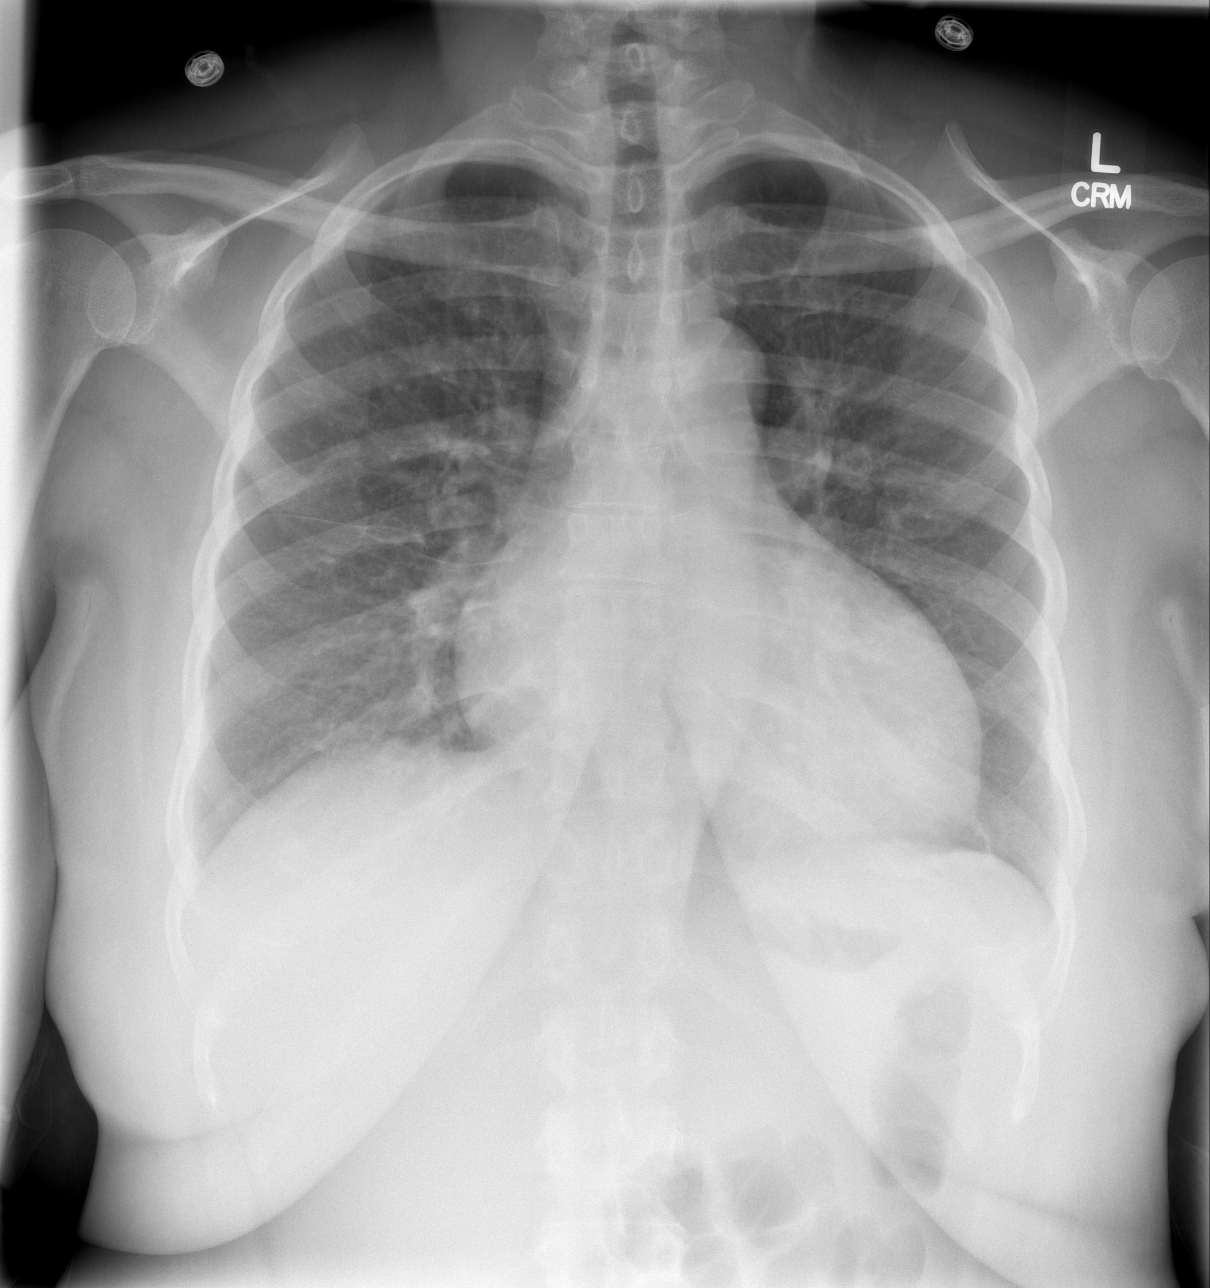

[w abdomen upright]
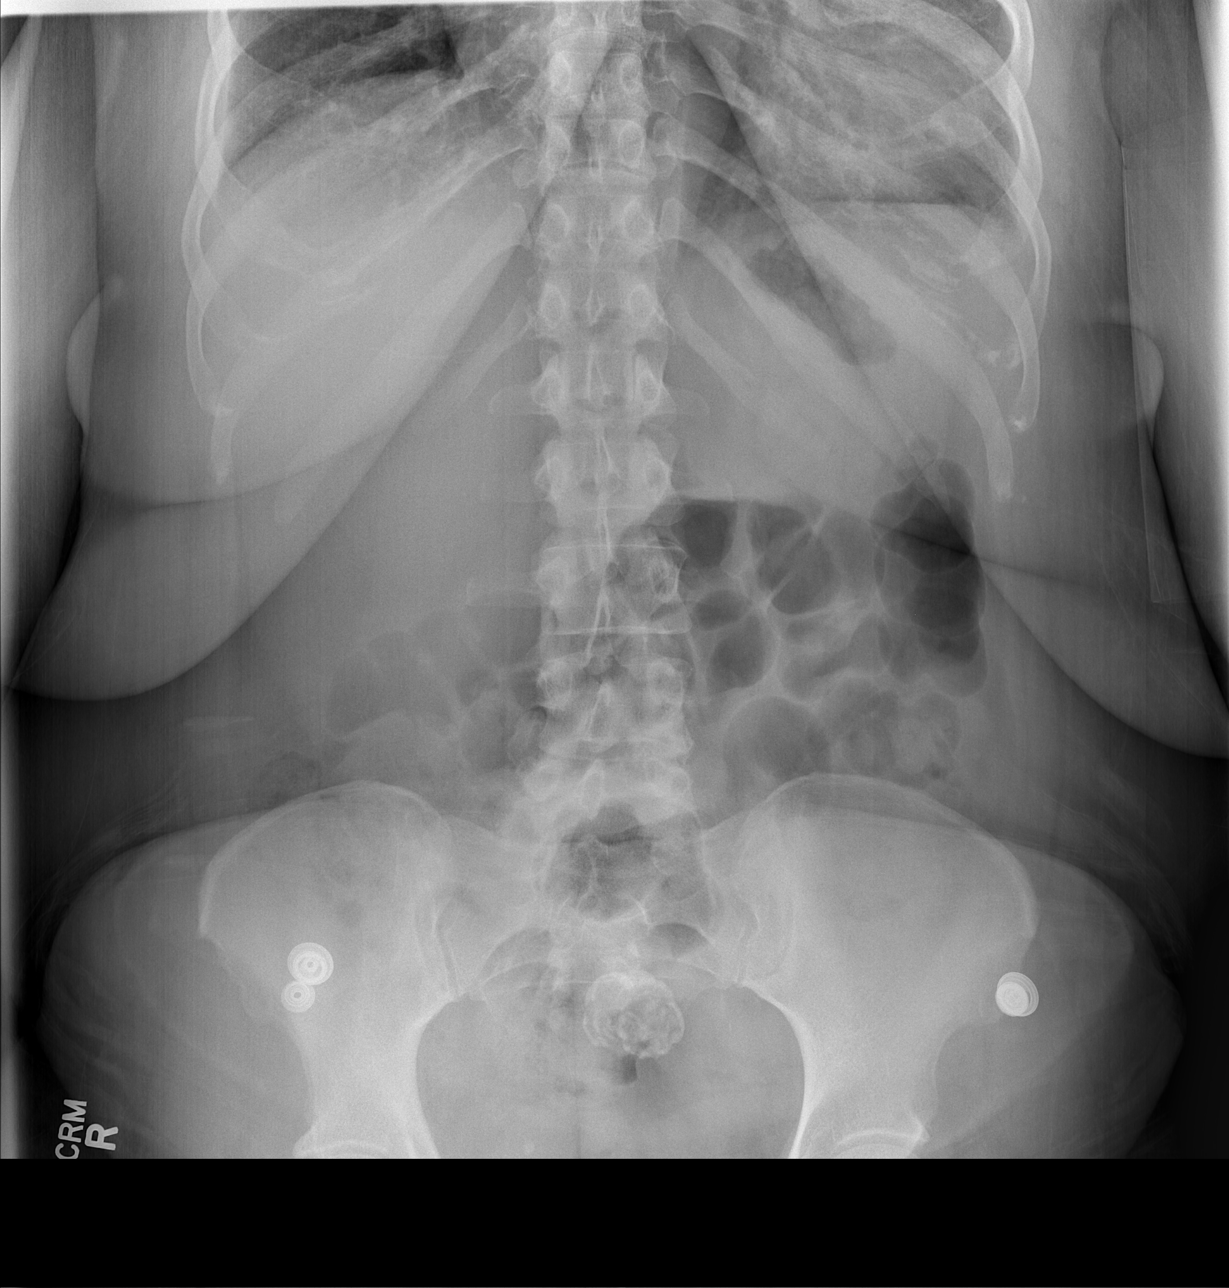

[t abdomen supine]
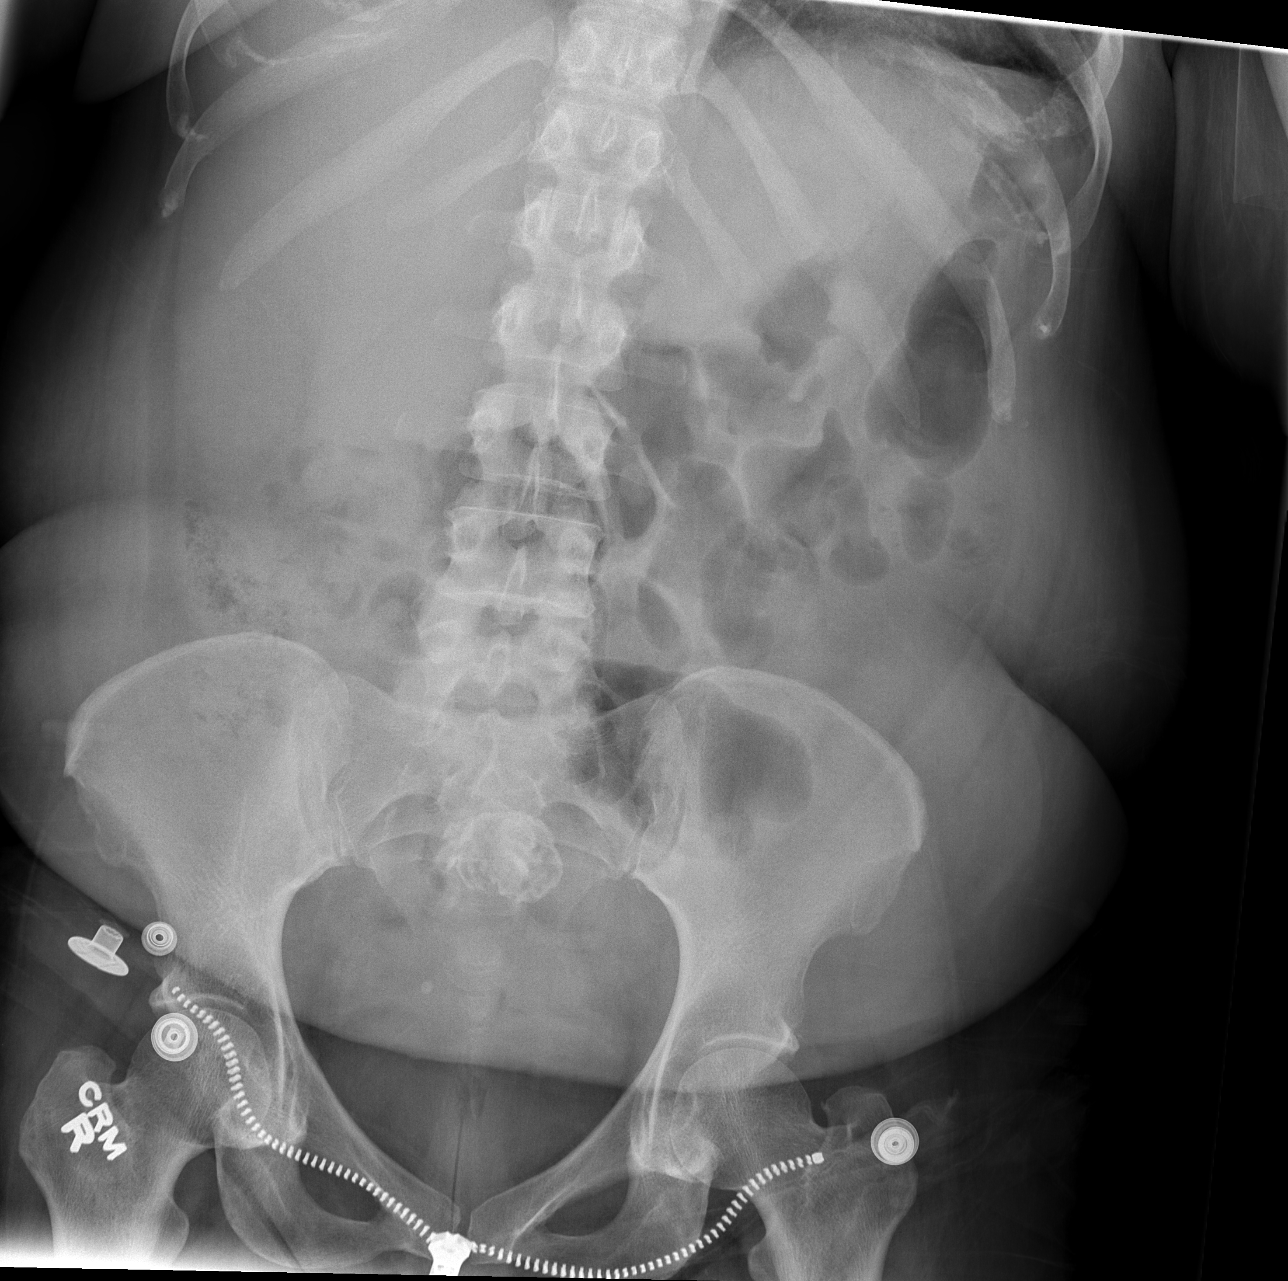

[3 of 3 positions shown; findings below may reference images not displayed]

FINDINGS: Ill-defined peribronchovascular opacities in the right
mid to lower lung, favored to reflects mild right lower lobe
bronchopneumonia.  No pleural effusions.  Pulmonary vasculature is
within normal limits.  Mild cardiomegaly is unchanged.  Upper
mediastinal contours are unremarkable.

Gas and stool are seen scattered throughout the colon extending to
the level of the distal rectum.  No pathologic distension of small
bowel is noted.  No gross evidence of pneumoperitoneum.  Coarse
calcifications in the central pelvis likely represent calcified
fibroid.
IMPRESSION: 1.  Nonobstructive bowel gas pattern.
2.  No pneumoperitoneum.
3.  Probable bronchopneumonia in the right lower lobe.
4.  Mild cardiomegaly.

## 2013-10-01 IMAGING — US US ABDOMEN COMPLETE
1 series · 13 of 25 positions shown · non-contrast
Comparison: 04/24/2013.

CLINICAL DATA: Upper abdominal pain.

COMPLETE ABDOMINAL ULTRASOUND

[Series 1: us abdomen complete · 0.30mm/px · 13 of 47 slices shown]
[im 1/47]
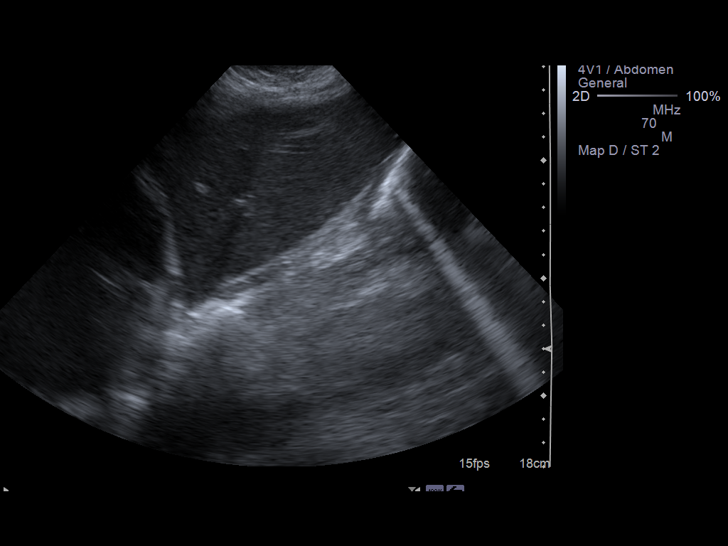
[im 4/47]
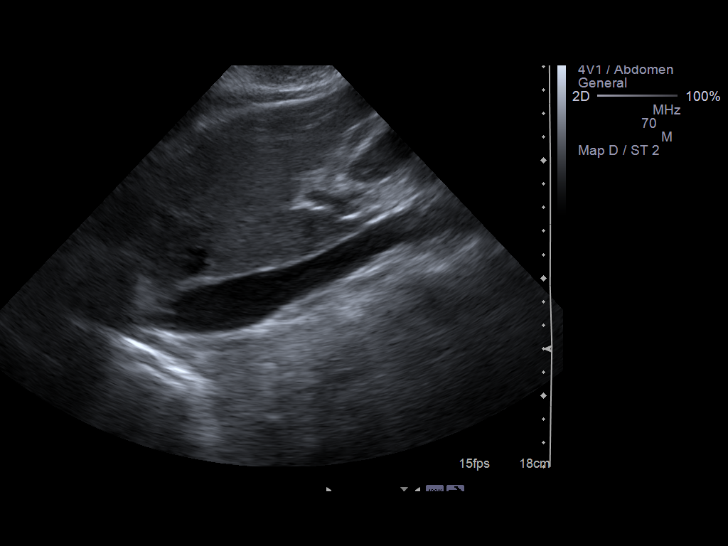
[im 8/47]
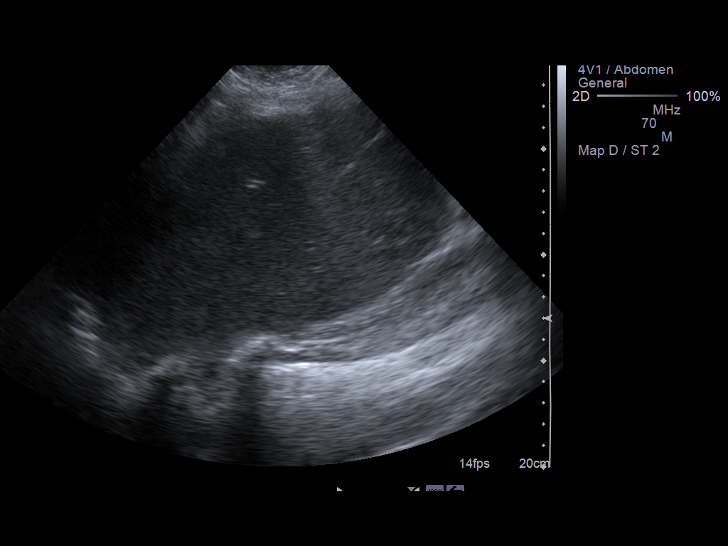
[im 12/47]
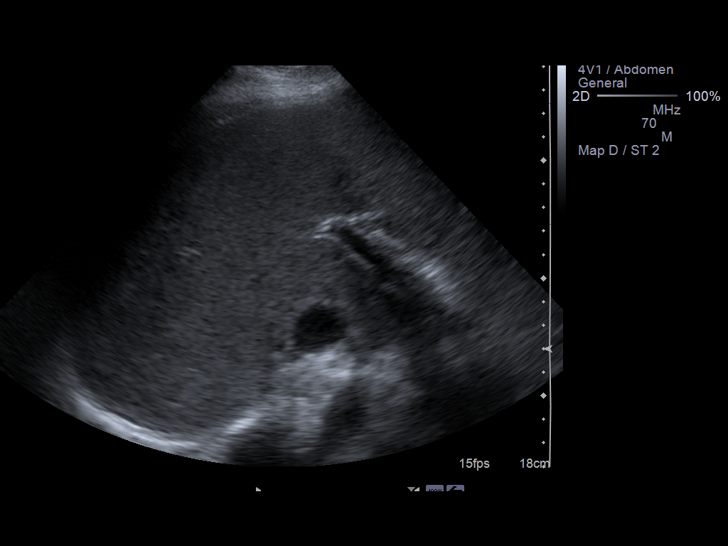
[im 16/47]
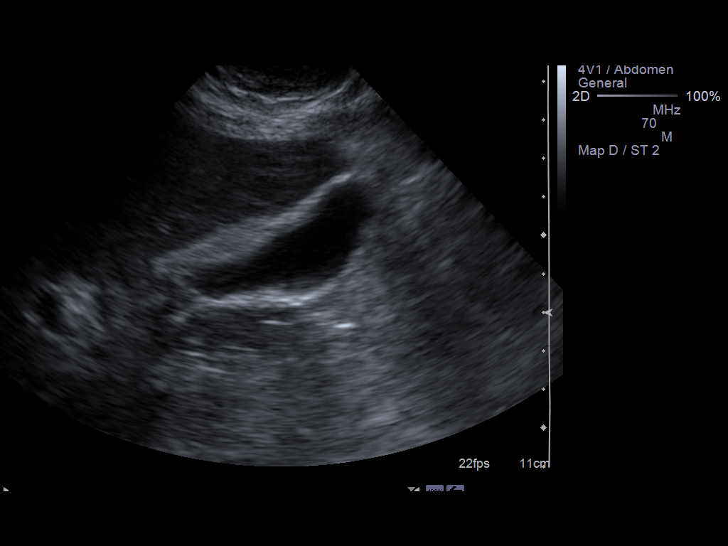
[im 20/47]
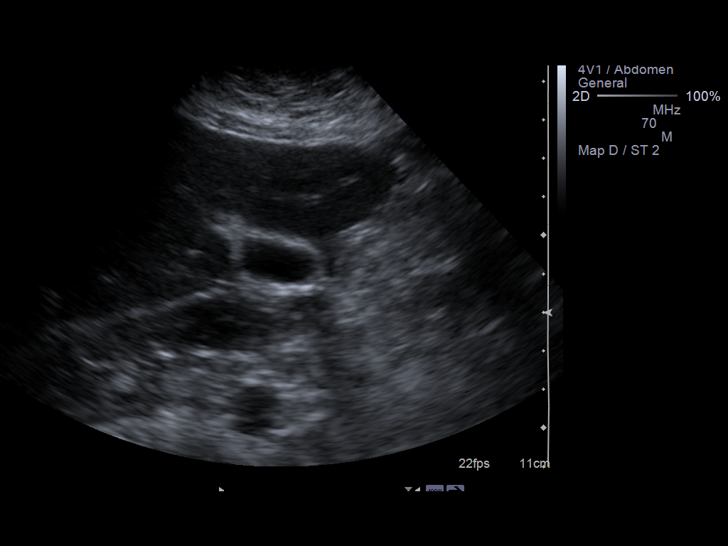
[im 24/47]
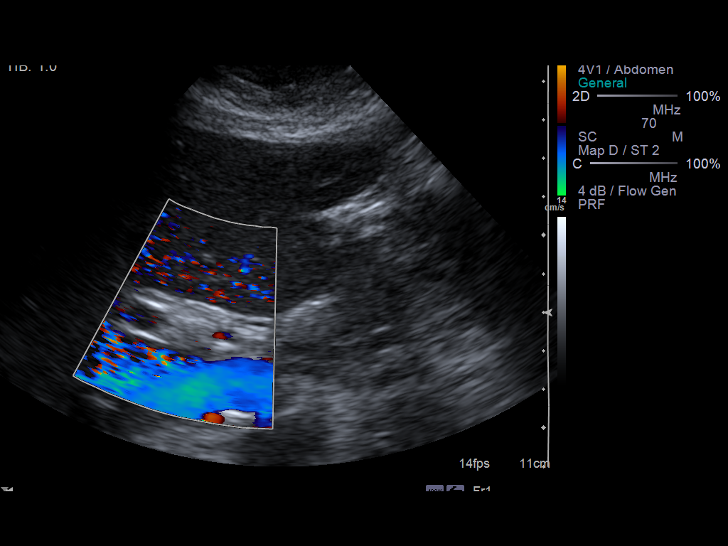
[im 27/47]
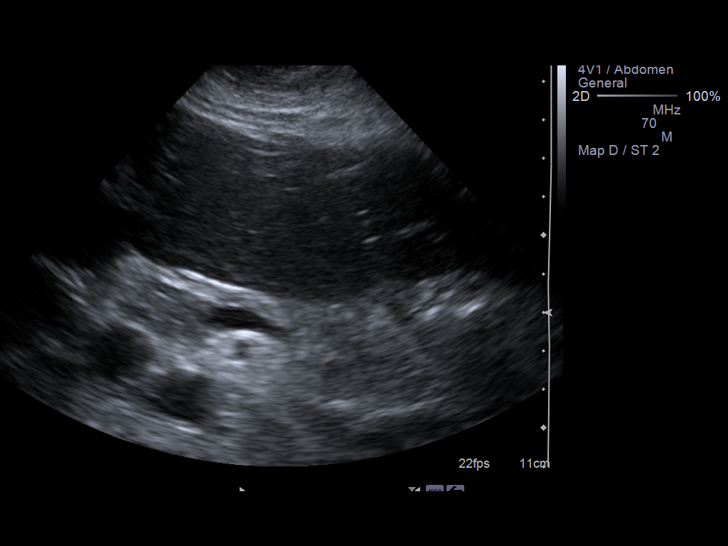
[im 31/47]
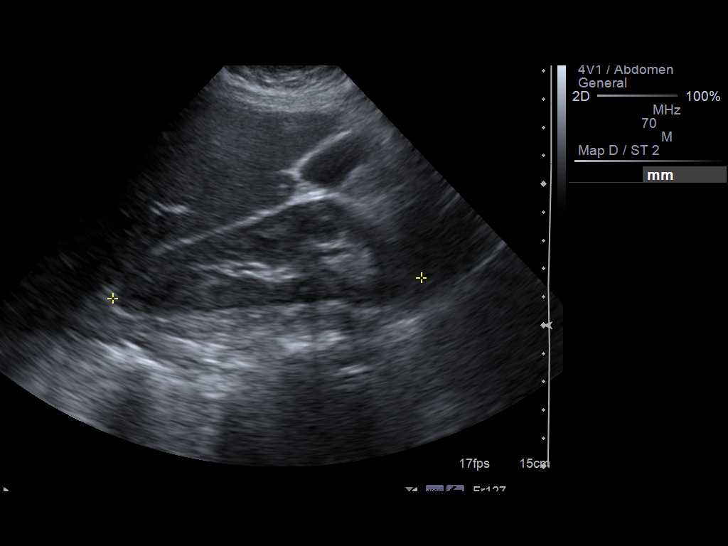
[im 35/47]
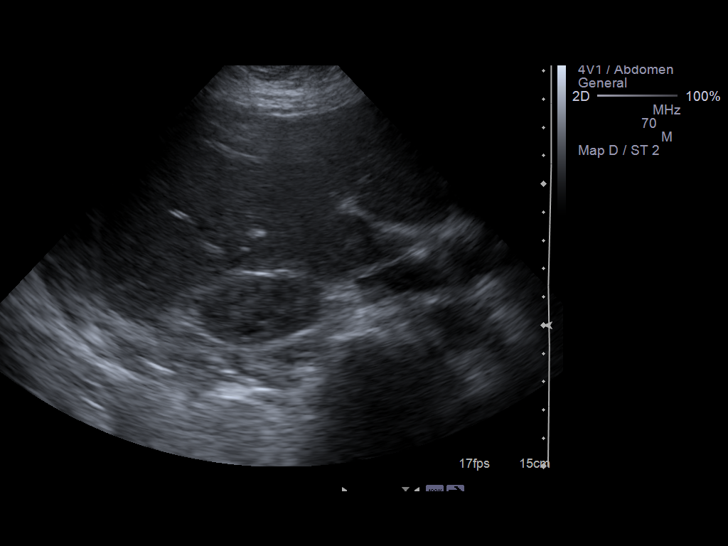
[im 39/47]
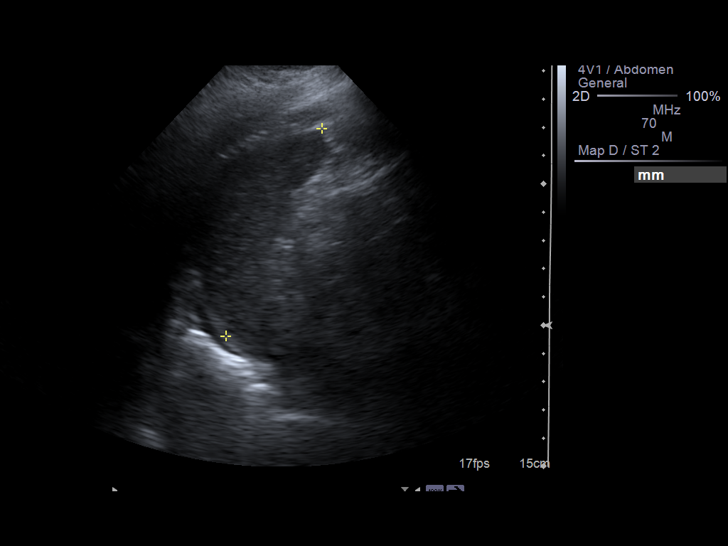
[im 43/47]
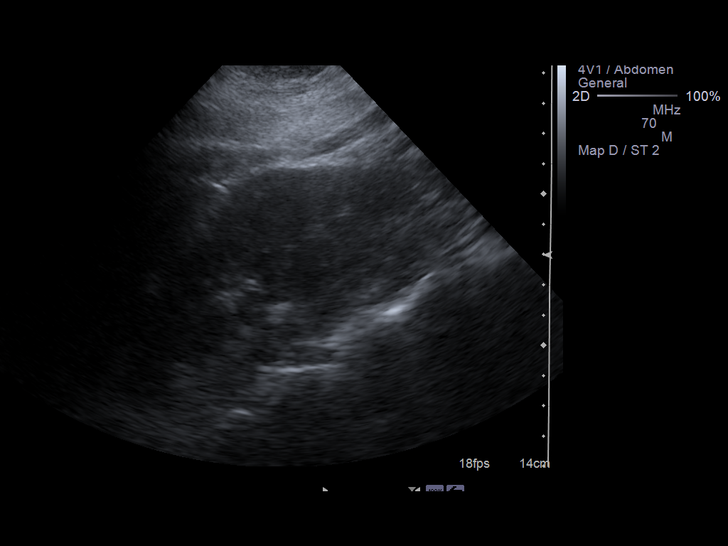
[im 47/47]
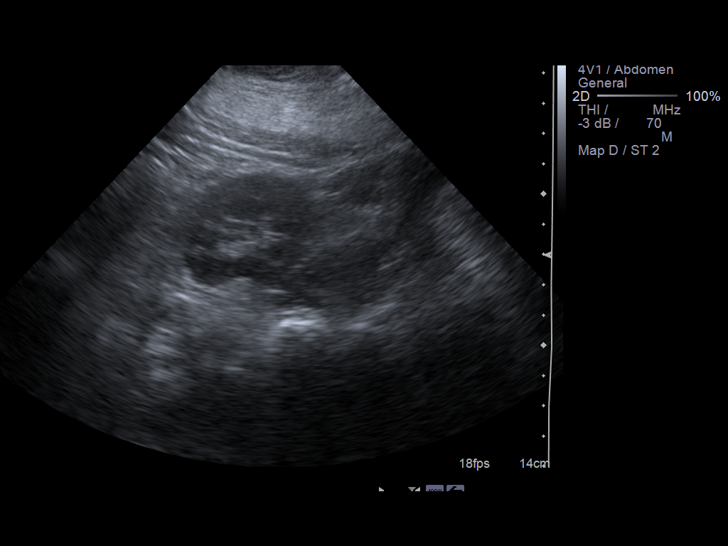

[13 of 25 positions shown; findings below may reference images not displayed]

FINDINGS: Gallbladder:  Gallbladder is nearly completely collapsed, with a
normal wall thickness.  Trace amount of pericholecystic fluid,
similar to the prior examination.  Per report from the sonographer,
the patient did not exhibit a sonographic Murphy's sign on
examination.  No gallstones.

Common bile duct:  Normal caliber measuring 2.9 mm in the porta
hepatis.

Liver:  No focal mass lesion seen.  Within normal limits in
parenchymal echogenicity.

IVC:  Patent throughout its visualized course in the abdomen.

Pancreas:  Although the pancreas is difficult to visualize in its
entirety, no focal pancreatic abnormality is identified.

Spleen:  Normal size and echotexture without focal parenchymal
abnormality.8.1 cm in length.

Right Kidney:  No hydronephrosis.  Well-preserved cortex.  Normal
size and parenchymal echotexture without focal abnormalities.
cm in length.

Left Kidney:  No hydronephrosis.  Well-preserved cortex.  Normal
size and parenchymal echotexture without focal abnormalities.
cm in length.

Abdominal aorta:  Normal caliber measuring 2 cm in diameter
proximally, tapering appropriately distally.
IMPRESSION: 1.  Trace amount of pericholecystic fluid, similar to the prior
examination.  As there are no gallstones, the gallbladder does not
appear distended, gallbladder wall thickness is normal, and there
was no sonographic Murphy's sign, findings are not favored to
represent an acute cholecystitis.
2.  The examination is otherwise normal.

## 2013-10-04 ENCOUNTER — Ambulatory Visit (INDEPENDENT_AMBULATORY_CARE_PROVIDER_SITE_OTHER): Payer: Medicaid Other | Admitting: Internal Medicine

## 2013-10-04 ENCOUNTER — Encounter (HOSPITAL_COMMUNITY): Payer: Self-pay | Admitting: Anesthesiology

## 2013-10-04 DIAGNOSIS — I5022 Chronic systolic (congestive) heart failure: Secondary | ICD-10-CM

## 2013-10-04 LAB — BASIC METABOLIC PANEL
BUN: 17 mg/dL (ref 6–23)
CO2: 30 mEq/L (ref 19–32)
Calcium: 9.4 mg/dL (ref 8.4–10.5)
Chloride: 100 mEq/L (ref 96–112)
Creatinine, Ser: 1.2 mg/dL (ref 0.4–1.2)
Potassium: 3.5 mEq/L (ref 3.5–5.1)

## 2013-10-07 ENCOUNTER — Telehealth (HOSPITAL_COMMUNITY): Payer: Self-pay | Admitting: *Deleted

## 2013-10-07 DIAGNOSIS — I5022 Chronic systolic (congestive) heart failure: Secondary | ICD-10-CM

## 2013-10-07 NOTE — Telephone Encounter (Signed)
Message copied by Scarlette Calico on Mon Oct 07, 2013  4:36 PM ------      Message from: Jolaine Artist      Created: Mon Oct 07, 2013 10:17 AM       Potassium back down. Recheck in 1 month. ------

## 2013-10-08 ENCOUNTER — Ambulatory Visit: Payer: Medicaid Other | Admitting: Pulmonary Disease

## 2013-10-08 ENCOUNTER — Other Ambulatory Visit: Payer: Self-pay | Admitting: Pulmonary Disease

## 2013-10-08 DIAGNOSIS — G478 Other sleep disorders: Secondary | ICD-10-CM

## 2013-11-05 ENCOUNTER — Ambulatory Visit (HOSPITAL_BASED_OUTPATIENT_CLINIC_OR_DEPARTMENT_OTHER): Payer: Medicaid Other | Attending: Pulmonary Disease | Admitting: Radiology

## 2013-11-05 DIAGNOSIS — G478 Other sleep disorders: Secondary | ICD-10-CM

## 2013-11-05 DIAGNOSIS — G4710 Hypersomnia, unspecified: Secondary | ICD-10-CM | POA: Insufficient documentation

## 2013-11-07 ENCOUNTER — Other Ambulatory Visit: Payer: Medicaid Other

## 2013-11-08 DIAGNOSIS — G4730 Sleep apnea, unspecified: Secondary | ICD-10-CM

## 2013-11-08 DIAGNOSIS — G4710 Hypersomnia, unspecified: Secondary | ICD-10-CM

## 2013-11-08 DIAGNOSIS — G478 Other sleep disorders: Secondary | ICD-10-CM

## 2013-11-09 NOTE — Procedures (Signed)
[name], [name]             ACCOUNT NO.:  [id number]  MEDICAL RECORD NO.:  [id number]          PATIENT TYPE:  OUT  LOCATION:  SLEEP CENTER                 FACILITY[facility]  [facility]  PHYSICIAN:  Kathee Delton, MD,FCCPDATE OF BIRTH:  January 06[birth year] 1959  DATE OF STUDY:  11/06/2013                           NOCTURNAL POLYSOMNOGRAM  REFERRING PHYSICIAN:  Kathee Delton, MD,FCCP  INDICATION FOR STUDY:  Hypersomnia with sleep apnea.  EPWORTH SLEEPINESS SCORE:  3.  SLEEP ARCHITECTURE:  The patient had a total sleep time of 323 minutes with no slow-wave sleep and 76 minutes of REM.  Sleep onset latency was normal at 22 minutes and REM onset was normal at 78 minutes.  Sleep efficiency was 84%.  RESPIRATORY DATA:  The patient was noted to have 5 obstructive and central apneas along with 4 obstructive hypopneas, giving her an apnea- hypopnea index of 2 events per hour.  The events occurred in all body positions and there was very mild snoring noted throughout.  OXYGEN DATA:  There was O2 desaturation as low as 91% during the night that was transient in nature.  CARDIAC DATA:  No clinically significant arrhythmias were noted.  MOVEMENT-PARASOMNIA:  The patient had no significant leg jerks or other abnormalities of behavior noted.  IMPRESSIONS-RECOMMENDATIONS: 1. Small numbers of obstructive events, which do not meet the AHI     criteria for the obstructive sleep apnea syndrome. 2. No obvious identifiable cause for the patient's complaint of     frequent awakenings at home while sleeping.  Clinical correlation     is suggested.     Kathee Delton, MD,FCCP [place], [place] Board of Sleep Medicine    [facility]  D:  11/08/2013 08:47:44  T:  11/09/2013 01:10:00  Job:  [id number]

## 2013-11-11 ENCOUNTER — Telehealth: Payer: Self-pay | Admitting: Pulmonary Disease

## 2013-11-11 NOTE — Telephone Encounter (Signed)
Tried to call pt twice today regarding sleep test.  Home number disconnected.  See if she has new one.  Let pt know that her sleep study shows no sleep apnea, no leg kicks, no abnormal heart rhythms, but she did have frequent awakenings during the night (probably because of all of the wires). I can see nothing to explain the cause of her frequent awakenings, and she should look at sleeping environment such as mattress, pillow, sounds, light, bed partner, pets, temperature.  Also work on normalizing sleep schedule as much as possible.

## 2013-11-12 NOTE — Telephone Encounter (Signed)
Results have been explained to patient, pt expressed understanding. Nothing further needed.  Numbers verified with pt

## 2013-12-11 ENCOUNTER — Ambulatory Visit (HOSPITAL_COMMUNITY)
Admission: RE | Admit: 2013-12-11 | Discharge: 2013-12-11 | Disposition: A | Discharge status: Home / Self Care | Payer: Medicaid Other | Source: Ambulatory Visit | Attending: Internal Medicine | Admitting: Internal Medicine

## 2013-12-11 VITALS — BP 102/60 | HR 70 | Resp 16 | Wt 165.0 lb

## 2013-12-11 DIAGNOSIS — I2789 Other specified pulmonary heart diseases: Secondary | ICD-10-CM

## 2013-12-11 DIAGNOSIS — I5022 Chronic systolic (congestive) heart failure: Secondary | ICD-10-CM

## 2013-12-11 DIAGNOSIS — I2510 Atherosclerotic heart disease of native coronary artery without angina pectoris: Secondary | ICD-10-CM

## 2013-12-11 DIAGNOSIS — I272 Pulmonary hypertension, unspecified: Secondary | ICD-10-CM

## 2013-12-11 MED ORDER — FUROSEMIDE 20 MG PO TABS: 40.0000 mg | ORAL_TABLET | Freq: Every day | ORAL | Status: DC

## 2013-12-11 NOTE — Progress Notes (Signed)
Patient ID: [facility], female   DOB: 06-24-1961, 52 y.o.   MRN: [id number] Cardiologist: Dr [name] Cancel ( does not see anymore) PCP: Dr. Vista Lawman  HPI:  [name] is a 52 yo with a history of NICM, HTN, and chronic systolic heart failure [id number] (2009 EF 55%), anemia, and asthma.   Last cath 2005 [facility] with no CAD and PA pressures in mid 40's   CT of chest 03/18/13 no PE but prominent interstitial pattern. VQ 07/02/13 Normal PFTs done 7/14 - FVC 2.1, FEV1 1.5 FEV1/FVC 72, DLCO 12.01 (restrictive picture).  ANA, HIV, ANCA, anti-GBM Ab and RF negative  Admitted to [facility] 04/17/13 through 04/19/13 with increased dyspnea. Diuresed with IV lasix. D/C weight 160. She was re-evaluated in [facility] ED on 6/13. Had been off all HF medications. She was restarted on carvedilol 3.125 mg bid lasix 20 mg daily, lisinopril 10 mg daily and referred to Dr. [name] Cancel for further evaluation. He performed outpatient RHC and referred her to HF Clinic for further evaluation of PH. .  06/13/13 RHC  Mean RA: 20 mmHg  RV: 76/12 mmHg  PA: 75/36 mmHg with a mean of 49 mmHg  PCWP: 27 mmHg  Ao sat : 94% PA sat 50%  Fick Cardiac Output: 3.1 L/minute, 1.8 L/minute/M2  PVR: 8.0 wood units 567 dysnes-sec cm5   07/05/13 R/LHC RA mean 7  RV 44/11  PA 57/27, mean 40  PA (50 mcg/kg/min adenosine) 56/27, mean 41  PA (100 mcg/kg/min adenosine) 52/24, mean 37  PA (150 mcg/kg/min adenosine) 52/25, mean 38  Unable to increase to 200 mcg/kg/min due to pump problems  PCWP mean 16  LV 127/21  AO 125/73  Oxygen saturations:  PA 69%  AO 93%  Cardiac Output (Fick) 4.21  Cardiac Index (Fick) 2.61  Cardiac Output (Thermo) 3.53  Cardiac Index (Thermo) 2.19  PVR (Fick): 5.7 WU  PVR (Thermo): 6.8 WU  Coronary angiography:  Coronary dominance: right  Left mainstem: No significant disease.  Left anterior descending (LAD): 50% mid LAD stenosis with distal tapering.  Left circumflex (LCx): Moderate early OM1 with 50% ostial stenosis. Mid  LCx with 40% stenosis.  Right coronary artery (RCA): Diffuse RCA disease with 70-80% ostial stenosis and damping of the waveform; serial 50% stenoses in the mid and distal RCA.  Left ventriculography: Global hypokinesis with EF estimated at 35%.  Final Conclusions: Diffuse moderate CAD. Most significant lesion is ostial RCA stenosis. Reviewed with Dr. Angelena Form. 70-80% stenosis with ostial damping in setting of relatively small caliber RCA. The RCA is diffusely disease. In the absence of ACS (no chest pain), would treat medically. Other coronary distributions have diffuse moderate disease.   08/29/13 - 6 minute walk today: [place] (07/29/13 ): K 4, creatinine 0.79 Labs (9/11/ 14 ): K 4.5 Creatinine 1.2  Labs (09/03/13) : Cholesterol 198 Triglycerides 93 HDL 47 Labs 09/30/13: K 5.4 Creatinine 1.14  Labs 10/04/13: K 3.5 Creatinine 1.2  09/2013 EF 45-50%. RV normal. No evidence PAH by echo  Has sleep study 11/05/13--> no evidence of sleep apnea.   She returns for follow up. She had sleep study which showed no evidence of sleep apnea. She continues on  sildenafil to 40 mg tid. Overall she feels great. Says her appetite has improved now that she feels better.  Denies SOB/PND/Orthopnea/dizziness. Weight at home 160-165 pounds.  Able to walk up steps. Compliant with medications. Taking all medications.  SH: single, lives in [place], nonsmoker.   FH:  No  premature CAD.   ROS: All systems negative except as listed in HPI, PMH and Problem List.   Past Medical History  Diagnosis Date  . Hypertension   . Hypercholesterolemia   . Chronic systolic CHF (congestive heart failure)   . Asthma   . Nonischemic cardiomyopathy     a. 2005 Cath: nonobstructive dzs;  b. 04/2013 Echo: EF 3035%, diff HK, restrictive physiology, mildly dil LA, PASP 37mmHg.  . Microcytic anemia     Current Outpatient Prescriptions on File Prior to Encounter  Medication Sig Dispense Refill  . albuterol (PROVENTIL  HFA;VENTOLIN HFA) 108 (90 BASE) MCG/ACT inhaler Inhale 2 puffs into the lungs every 4 (four) hours as needed for wheezing or shortness of breath.      Marland Kitchen aspirin EC 81 MG EC tablet Take 1 tablet (81 mg total) by mouth daily.      Marland Kitchen atorvastatin (LIPITOR) 40 MG tablet Take 1 tablet (40 mg total) by mouth daily at 6 PM.  30 tablet  6  . carvedilol (COREG) 6.25 MG tablet Take 1.5 tablets (9.375 mg total) by mouth 2 (two) times daily with a meal.  90 tablet  3  . ferrous sulfate 325 (65 FE) MG tablet Take 325 mg by mouth daily with breakfast.      . furosemide (LASIX) 20 MG tablet Take 2 tablets (40 mg total) by mouth 2 (two) times daily.  120 tablet  6  . HYDROcodone-acetaminophen (NORCO/VICODIN) 5-325 MG per tablet Take 1 tablet by mouth every 4 (four) hours as needed for pain.      Marland Kitchen lisinopril (PRINIVIL,ZESTRIL) 10 MG tablet Take 1 tablet (10 mg total) by mouth daily.  30 tablet  6  . sildenafil (REVATIO) 20 MG tablet Take 2 tablets (40 mg total) by mouth 3 (three) times daily.  180 tablet  3  . spironolactone (ALDACTONE) 25 MG tablet Take 1 tablet (25 mg total) by mouth daily.  30 tablet  6   No current facility-administered medications on file prior to encounter.    Filed Vitals:   12/11/13 0938  BP: 102/60  Pulse: 70  Resp: 16  Weight: 165 lb (74.844 kg)  SpO2: 99%   PHYSICAL EXAM: General: Well appearing. No resp difficulty  HEENT: normal  Neck: supple. JVP flat. Carotids 2+ bilaterally; no bruits. No lymphadenopathy or thryomegaly appreciated.  Cor: PMI normal. Regular rate & rhythm. 2/6 TR Lungs: clear  Abdomen: soft, nontender, nondistended. No hepatosplenomegaly. No bruits or masses. Good bowel sounds.  Extremities: no cyanosis, clubbing, rash, no edema Neuro: alert & orientedx3, cranial nerves grossly intact. Moves all 4 extremities w/o difficulty. Affect pleasant.   ASSESSMENT & PLAN:  1. Chronic systolic HF: She is much improved. NYHA I ECHO 09/2013.  EF 45-50% RV normal  no significant TR.  -Volume status stable despite weight gain. Dry weight 160-165 pounds at home. Cut back lasix to 40 mg in am and take additional  40 mg of lasix  If weight is 167 pounds or greater.   - Continue Coreg, lisinopril, and spironolactone at current doses.  Reinforced daily weights and low salt food choices.   2. PAH: PVR 5.7 WU on RHC, not reactive to adenosine. Doing great. Continue sildenafil 40 mg tid. V/Q scan normal.  PFTs with restrictive picture and some concern for possible interstitial lung disease on prior chest CT.  -Evaluated by pulmonary. Had sleep study in November. No evidence of sleep apnea.  3. CAD: Ostial 70-80% RCA lesion.  No  evidence of ischemia. Continue ASA 81 and statin.     Followup in 3-4 months . Check BMET at next visit.   CLEGG,AMY NP-C  12/11/2013 9:43 AM

## 2013-12-11 NOTE — Patient Instructions (Signed)
Take lasix 40 mg daily and an extra 40 mg of lasix if your weight is 167 pounds or greater  Do the following things EVERYDAY: 1) Weigh yourself in the morning before breakfast. Write it down and keep it in a log. 2) Take your medicines as prescribed 3) Eat low salt foods-Limit salt (sodium) to 2000 mg per day.  4) Stay as active as you can everyday 5) Limit all fluids for the day to less than 2 liters  Follow up in 3-4 months

## 2014-02-18 ENCOUNTER — Other Ambulatory Visit (HOSPITAL_COMMUNITY): Payer: Self-pay | Admitting: Internal Medicine

## 2014-03-18 ENCOUNTER — Ambulatory Visit (HOSPITAL_COMMUNITY)
Admission: RE | Admit: 2014-03-18 | Discharge: 2014-03-18 | Disposition: A | Discharge status: Home / Self Care | Payer: Medicaid Other | Source: Ambulatory Visit | Attending: Internal Medicine | Admitting: Internal Medicine

## 2014-03-18 VITALS — BP 143/63 | HR 75 | Wt 180.0 lb

## 2014-03-18 DIAGNOSIS — I272 Pulmonary hypertension, unspecified: Secondary | ICD-10-CM

## 2014-03-18 DIAGNOSIS — I5022 Chronic systolic (congestive) heart failure: Secondary | ICD-10-CM

## 2014-03-18 DIAGNOSIS — I2789 Other specified pulmonary heart diseases: Secondary | ICD-10-CM | POA: Insufficient documentation

## 2014-03-18 LAB — BASIC METABOLIC PANEL
BUN: 16 mg/dL (ref 6–23)
CHLORIDE: 102 meq/L (ref 96–112)
CO2: 25 mEq/L (ref 19–32)
CREATININE: 1 mg/dL (ref 0.50–1.10)
Calcium: 9.1 mg/dL (ref 8.4–10.5)
GFR calc non Af Amer: 63 mL/min — ABNORMAL LOW (ref >90)
GFR, EST AFRICAN AMERICAN: 73 mL/min — AB (ref >90)
Glucose, Bld: 131 mg/dL — ABNORMAL HIGH (ref 70–99)
Potassium: 3.5 mEq/L — ABNORMAL LOW (ref 3.7–5.3)
Sodium: 141 mEq/L (ref 137–147)

## 2014-03-18 MED ORDER — CARVEDILOL 12.5 MG PO TABS
12.5000 mg | ORAL_TABLET | Freq: Two times a day (BID) | ORAL | Status: DC
Start: 2014-03-18 — End: 2014-11-17

## 2014-03-18 NOTE — Progress Notes (Signed)
Patient ID: [facility], female   DOB: 12-20-1960, 53 y.o.   MRN: [id number]  I did a 6 minute walk test with [name].  She walked approx. 750 feet with no shortness of breath.  She was conversant the entire walk.  She did not require and rest breaks.  Her heart rate remained around 100 bpm during the walk and oxygen 92-99%.

## 2014-03-18 NOTE — Patient Instructions (Addendum)
Follow up in 3-4 months   Take carvedilol 12.5 mg twice a day.   Do the following things EVERYDAY: 1) Weigh yourself in the morning before breakfast. Write it down and keep it in a log. 2) Take your medicines as prescribed 3) Eat low salt foods-Limit salt (sodium) to 2000 mg per day.  4) Stay as active as you can everyday 5) Limit all fluids for the day to less than 2 liters

## 2014-03-18 NOTE — Progress Notes (Signed)
Patient ID: [facility], female   DOB: [birth year], 53 y.o.   MRN: [id number] Cardiologist: Dr [name] Cancel ( does not see anymore) PCP: Dr. Vista Lawman  HPI:  Ms Harju is a 53 yo with a history of NICM, HTN, and chronic systolic heart failure 99991111 (2009 EF 55%), anemia, PAH on slidenafil, and asthma.   Last cath 2005 [facility] with no CAD and PA pressures in mid 40's   CT of chest 03/18/13 no PE but prominent interstitial pattern. VQ 07/02/13 Normal PFTs done 7/14 - FVC 2.1, FEV1 1.5 FEV1/FVC 72, DLCO 12.01 (restrictive picture).  ANA, HIV, ANCA, anti-GBM Ab and RF negative  Admitted to [facility] 04/17/13 through 04/19/13 with increased dyspnea. Diuresed with IV lasix. D/C weight 160. She was re-evaluated in [facility] ED on 6/13. Had been off all HF medications. She was restarted on carvedilol 3.125 mg bid lasix 20 mg daily, lisinopril 10 mg daily and referred to Dr. [name] Cancel for further evaluation. He performed outpatient RHC and referred her to HF Clinic for further evaluation of PH. .  06/13/13 RHC  Mean RA: 20 mmHg  RV: 76/12 mmHg  PA: 75/36 mmHg with a mean of 49 mmHg  PCWP: 27 mmHg  Ao sat : 94% PA sat 50%  Fick Cardiac Output: 3.1 L/minute, 1.8 L/minute/M2  PVR: 8.0 wood units 567 dysnes-sec cm5   07/05/13 R/LHC RA mean 7  RV 44/11  PA 57/27, mean 40  PA (50 mcg/kg/min adenosine) 56/27, mean 41  PA (100 mcg/kg/min adenosine) 52/24, mean 37  PA (150 mcg/kg/min adenosine) 52/25, mean 38  Unable to increase to 200 mcg/kg/min due to pump problems  PCWP mean 16  LV 127/21  AO 125/73  Oxygen saturations:  PA 69%  AO 93%  Cardiac Output (Fick) 4.21  Cardiac Index (Fick) 2.61  Cardiac Output (Thermo) 3.53  Cardiac Index (Thermo) 2.19  PVR (Fick): 5.7 WU  PVR (Thermo): 6.8 WU  Coronary angiography:  Coronary dominance: right  Left mainstem: No significant disease.  Left anterior descending (LAD): 50% mid LAD stenosis with distal tapering.  Left circumflex (LCx): Moderate early OM1 with 50%  ostial stenosis. Mid LCx with 40% stenosis.  Right coronary artery (RCA): Diffuse RCA disease with 70-80% ostial stenosis and damping of the waveform; serial 50% stenoses in the mid and distal RCA.  Left ventriculography: Global hypokinesis with EF estimated at 35%.  Final Conclusions: Diffuse moderate CAD. Most significant lesion is ostial RCA stenosis. Reviewed with Dr. Angelena Form. 70-80% stenosis with ostial damping in setting of relatively small caliber RCA. The RCA is diffusely disease. In the absence of ACS (no chest pain), would treat medically. Other coronary distributions have diffuse moderate disease.   08/29/13 - 6 MW: 408 feet 03/18/14 6MW: 750 feet   Labs (07/29/13 ): K 4, creatinine 0.79 Labs (9/11/ 14 ): K 4.5 Creatinine 1.2  Labs (09/03/13) : Cholesterol 198 Triglycerides 93 HDL 47 Labs 09/30/13: K 5.4 Creatinine 1.14  Labs 10/04/13: K 3.5 Creatinine 1.2  09/2013 EF 45-50%. RV normal. No evidence PAH by echo  Had sleep study 11/05/13--> no evidence of sleep apnea.   She returns for follow up. Overall she feels great. Denies SOB/PND/Orthopnea/CP. Weight trending up from 165 to 175 pounds. Able to walk up steps. Not exercising. Compliant with medications. Taking all medications.  SH: single, lives in [place], nonsmoker.  FH:  No premature CAD.   ROS: All systems negative except as listed in HPI, PMH and Problem List.   Past Medical History  Diagnosis Date  . Hypertension   . Hypercholesterolemia   . Chronic systolic CHF (congestive heart failure)   . Asthma   . Nonischemic cardiomyopathy     a. 2005 Cath: nonobstructive dzs;  b. 04/2013 Echo: EF 3035%, diff HK, restrictive physiology, mildly dil LA, PASP 62mmHg.  . Microcytic anemia     Current Outpatient Prescriptions on File Prior to Encounter  Medication Sig Dispense Refill  . albuterol (PROVENTIL HFA;VENTOLIN HFA) 108 (90 BASE) MCG/ACT inhaler Inhale 2 puffs into the lungs every 4 (four) hours as needed for  wheezing or shortness of breath.      Marland Kitchen aspirin EC 81 MG EC tablet Take 1 tablet (81 mg total) by mouth daily.      Marland Kitchen atorvastatin (LIPITOR) 40 MG tablet Take 1 tablet (40 mg total) by mouth daily at 6 PM.  30 tablet  6  . carvedilol (COREG) 6.25 MG tablet Take 1.5 tablets (9.375 mg total) by mouth 2 (two) times daily with a meal.  90 tablet  2  . ferrous sulfate 325 (65 FE) MG tablet Take 325 mg by mouth daily with breakfast.      . furosemide (LASIX) 20 MG tablet Take 2 tablets (40 mg total) by mouth daily. TAKE EXTRA TAB FOR WEIGHT 167 OR GREATER  45 tablet  2  . lisinopril (PRINIVIL,ZESTRIL) 10 MG tablet TAKE 1 TABLET BY MOUTH ONCE DAILY  34 tablet  2  . sildenafil (REVATIO) 20 MG tablet Take 2 tablets (40 mg total) by mouth 3 (three) times daily.  180 tablet  3  . spironolactone (ALDACTONE) 25 MG tablet TAKE 1 TABLET BY MOUTH DAILY.  32 tablet  2   No current facility-administered medications on file prior to encounter.    Filed Vitals:   03/18/14 0928  BP: 143/63  Pulse: 75  Weight: 180 lb (81.647 kg)  SpO2: 99%   PHYSICAL EXAM: General: Well appearing. No resp difficulty  HEENT: normal  Neck: supple. JVP flat. Carotids 2+ bilaterally; no bruits. No lymphadenopathy or thryomegaly appreciated.  Cor: PMI normal. Regular rate & rhythm. 2/6 TR Lungs: clear  Abdomen: obese, soft, nontender, nondistended. No hepatosplenomegaly. No bruits or masses. Good bowel sounds.  Extremities: no cyanosis, clubbing, rash, no edema Neuro: alert & orientedx3, cranial nerves grossly intact. Moves all 4 extremities w/o difficulty. Affect pleasant.   ASSESSMENT & PLAN:  1. Chronic systolic HF: She is much improved. NYHA I ECHO 09/2013.  EF 45-50% RV normal no significant TR.  -Volume status stable despite weight gain. Continue lasix 40 mg daily and take an additional 40 mg as needed for edema.    - Increase carvedilol to 12.5 mg twice a day. Continue  lisinopril, and spironolactone at current doses.   Check BMET today.  Reinforced daily weights and low salt food choices.   2. PAH: PVR 5.7 WU on RHC, not reactive to adenosine. Doing great. Continue sildenafil 40 mg tid. V/Q scan normal.  PFTs with restrictive picture and some concern for possible interstitial lung disease on prior chest CT.  -Evaluated by pulmonary. Had sleep study in November. No evidence of sleep apnea. 6MW today improved 400 to 750 feet.  3. CAD: Ostial 70-80% RCA lesion.  No evidence of ischemia. Continue ASA 81 and statin.     Follow up in 3-4 months .  Denaya Horn NP-C  03/18/2014 9:29 AM

## 2014-04-17 ENCOUNTER — Emergency Department (HOSPITAL_COMMUNITY)
Admission: EM | Admit: 2014-04-17 | Discharge: 2014-04-17 | Disposition: A | Discharge status: Home / Self Care | Payer: Medicaid Other | Attending: Emergency Medicine | Admitting: Emergency Medicine

## 2014-04-17 ENCOUNTER — Emergency Department (HOSPITAL_COMMUNITY): Payer: Medicaid Other

## 2014-04-17 ENCOUNTER — Encounter (HOSPITAL_COMMUNITY): Payer: Self-pay | Admitting: Emergency Medicine

## 2014-04-17 DIAGNOSIS — R635 Abnormal weight gain: Secondary | ICD-10-CM

## 2014-04-17 DIAGNOSIS — J45909 Unspecified asthma, uncomplicated: Secondary | ICD-10-CM | POA: Insufficient documentation

## 2014-04-17 DIAGNOSIS — I5022 Chronic systolic (congestive) heart failure: Secondary | ICD-10-CM | POA: Insufficient documentation

## 2014-04-17 DIAGNOSIS — Z9861 Coronary angioplasty status: Secondary | ICD-10-CM | POA: Insufficient documentation

## 2014-04-17 DIAGNOSIS — I10 Essential (primary) hypertension: Secondary | ICD-10-CM | POA: Insufficient documentation

## 2014-04-17 DIAGNOSIS — Z79899 Other long term (current) drug therapy: Secondary | ICD-10-CM | POA: Insufficient documentation

## 2014-04-17 DIAGNOSIS — E669 Obesity, unspecified: Secondary | ICD-10-CM | POA: Insufficient documentation

## 2014-04-17 DIAGNOSIS — I2510 Atherosclerotic heart disease of native coronary artery without angina pectoris: Secondary | ICD-10-CM | POA: Insufficient documentation

## 2014-04-17 DIAGNOSIS — N912 Amenorrhea, unspecified: Secondary | ICD-10-CM | POA: Insufficient documentation

## 2014-04-17 DIAGNOSIS — Z7982 Long term (current) use of aspirin: Secondary | ICD-10-CM | POA: Insufficient documentation

## 2014-04-17 DIAGNOSIS — Z3202 Encounter for pregnancy test, result negative: Secondary | ICD-10-CM | POA: Insufficient documentation

## 2014-04-17 DIAGNOSIS — Z8719 Personal history of other diseases of the digestive system: Secondary | ICD-10-CM | POA: Insufficient documentation

## 2014-04-17 DIAGNOSIS — D509 Iron deficiency anemia, unspecified: Secondary | ICD-10-CM | POA: Insufficient documentation

## 2014-04-17 DIAGNOSIS — E7800 Pure hypercholesterolemia, unspecified: Secondary | ICD-10-CM | POA: Insufficient documentation

## 2014-04-17 LAB — CBC WITH DIFFERENTIAL/PLATELET
BASOS ABS: 0 10*3/uL (ref 0.0–0.1)
BASOS PCT: 0 % (ref 0–1)
EOS ABS: 0.1 10*3/uL (ref 0.0–0.7)
EOS PCT: 1 % (ref 0–5)
HCT: 34.8 % — ABNORMAL LOW (ref 36.0–46.0)
Hemoglobin: 11.6 g/dL — ABNORMAL LOW (ref 12.0–15.0)
LYMPHS PCT: 31 % (ref 12–46)
Lymphs Abs: 1.9 10*3/uL (ref 0.7–4.0)
MCH: 29.9 pg (ref 26.0–34.0)
MCHC: 33.3 g/dL (ref 30.0–36.0)
MCV: 89.7 fL (ref 78.0–100.0)
Monocytes Absolute: 0.3 10*3/uL (ref 0.1–1.0)
Monocytes Relative: 5 % (ref 3–12)
Neutro Abs: 3.9 10*3/uL (ref 1.7–7.7)
Neutrophils Relative %: 63 % (ref 43–77)
Platelets: 330 10*3/uL (ref 150–400)
RBC: 3.88 MIL/uL (ref 3.87–5.11)
RDW: 12.6 % (ref 11.5–15.5)
WBC: 6.3 10*3/uL (ref 4.0–10.5)

## 2014-04-17 LAB — COMPREHENSIVE METABOLIC PANEL
ALT: 22 U/L (ref 0–35)
AST: 21 U/L (ref 0–37)
Albumin: 3.7 g/dL (ref 3.5–5.2)
Alkaline Phosphatase: 109 U/L (ref 39–117)
BUN: 10 mg/dL (ref 6–23)
CALCIUM: 9.5 mg/dL (ref 8.4–10.5)
CO2: 26 meq/L (ref 19–32)
CREATININE: 0.93 mg/dL (ref 0.50–1.10)
Chloride: 101 mEq/L (ref 96–112)
GFR calc Af Amer: 79 mL/min — ABNORMAL LOW (ref >90)
GFR, EST NON AFRICAN AMERICAN: 68 mL/min — AB (ref >90)
Glucose, Bld: 116 mg/dL — ABNORMAL HIGH (ref 70–99)
Potassium: 4 mEq/L (ref 3.7–5.3)
Sodium: 141 mEq/L (ref 137–147)
TOTAL PROTEIN: 7.9 g/dL (ref 6.0–8.3)
Total Bilirubin: 0.4 mg/dL (ref 0.3–1.2)

## 2014-04-17 LAB — LIPASE, BLOOD: Lipase: 30 U/L (ref 11–59)

## 2014-04-17 LAB — POC URINE PREG, ED: PREG TEST UR: NEGATIVE

## 2014-04-17 LAB — PRO B NATRIURETIC PEPTIDE: PRO B NATRI PEPTIDE: 111.1 pg/mL (ref 0–125)

## 2014-04-17 NOTE — ED Provider Notes (Signed)
CSN: [id number]     Arrival date & time 04/17/14  1146 History  This chart was scribed for Noland Fordyce, PA working with Ezequiel Essex, MD by Roxan Diesel, ED Scribe. This patient was seen in room TR10C/TR10C and the patient's care was started at 2:35 PM.   Chief Complaint  Patient presents with  . Amenorrhea    The history is provided by the patient. No language interpreter was used.    HPI Comments: [name] is a 53 y.o. female with h/o CHF, HTN and hypercholesterolemia who presents to the Emergency Department complaining of amenorrhea and weight gain.  Pt states she has not had a period since last month and "I want to know what's going on."  She denies fevers, nausea, vomiting, night sweats, cold chills, any other urinary or vaginal symptoms, abdominal pain, or bowel problems.  She notes that she has gained weight over the past several months, from 165 four months ago to 179 last month, to 184 today.  She was having periods during this weight gain.  Pt is taking her fluid pills for her CHF as instructed, 2x/day.  She does not feel she is gaining fluid in her legs or abdomen.  She denies SOB or palpitations.  She denies h/o abdominal surgeries or pancreatitis.  She does not drink alcohol.  Pt last saw her PCP on 4/16 but did not mention her amenorrhea at that time.  She did inform him of her weight gain but "he just thought I was gaining weight."   Patient Active Problem List   Diagnosis Date Noted  . Coronary atherosclerosis of native coronary artery 07/29/2013  . Other sleep disturbances 07/02/2013  . Chronic systolic heart failure [id number]  . GERD (gastroesophageal reflux disease) 06/18/2013  . Pulmonary hypertension 06/17/2013  . Ascites 06/07/2013  . Acute systolic CHF (congestive heart failure) 04/19/2013  . Acute on chronic systolic CHF (congestive heart failure) 04/19/2013  . Elevated troponin 04/19/2013  . Nonischemic cardiomyopathy   . Hypertension   .  Hypercholesterolemia   . Microcytic anemia     Past Medical History  Diagnosis Date  . Hypertension   . Hypercholesterolemia   . Chronic systolic CHF (congestive heart failure)   . Asthma   . Nonischemic cardiomyopathy     a. 2005 Cath: nonobstructive dzs;  b. 04/2013 Echo: EF 3035%, diff HK, restrictive physiology, mildly dil LA, PASP 18mmHg.  . Microcytic anemia     Past Surgical History  Procedure Laterality Date  . Coronary angioplasty      Family History  Problem Relation Age of Onset  . Heart disease Mother   . Heart disease Sister   . Heart disease Brother     History  Substance Use Topics  . Smoking status: Never Smoker   . Smokeless tobacco: Never Used  . Alcohol Use: No    OB History   Grav Para Term Preterm Abortions TAB SAB Ect Mult Living                   Review of Systems  Constitutional: Positive for unexpected weight change. Negative for fever, chills and diaphoresis.  Respiratory: Negative for shortness of breath.   Cardiovascular: Negative for palpitations and leg swelling.  Gastrointestinal: Negative for nausea, vomiting, abdominal pain, diarrhea, constipation and abdominal distention.  Genitourinary: Positive for menstrual problem (amnorrhea). Negative for dysuria, urgency, frequency, hematuria, flank pain, decreased urine volume, vaginal bleeding, vaginal discharge, enuresis, difficulty urinating, genital sores, vaginal pain, pelvic pain and  dyspareunia.  All other systems reviewed and are negative.     Allergies  Review of patient's allergies indicates no known allergies.  Home Medications   Prior to Admission medications   Medication Sig Start Date End Date Taking? Authorizing Provider  albuterol (PROVENTIL HFA;VENTOLIN HFA) 108 (90 BASE) MCG/ACT inhaler Inhale 2 puffs into the lungs every 4 (four) hours as needed for wheezing or shortness of breath. 04/08/13   Elwyn Lade, PA-C  aspirin EC 81 MG EC tablet Take 1 tablet (81 mg  total) by mouth daily. 04/19/13   Rogelia Mire, NP  atorvastatin (LIPITOR) 40 MG tablet Take 1 tablet (40 mg total) by mouth daily at 6 PM. 09/30/13   Amy D Clegg, NP  carvedilol (COREG) 12.5 MG tablet Take 1 tablet (12.5 mg total) by mouth 2 (two) times daily with a meal. 03/18/14   Amy D Clegg, NP  ferrous sulfate 325 (65 FE) MG tablet Take 325 mg by mouth daily with breakfast.    Historical Provider, MD  furosemide (LASIX) 20 MG tablet Take 2 tablets (40 mg total) by mouth daily. TAKE EXTRA TAB FOR WEIGHT 167 OR GREATER    Shaune Pascal Bensimhon, MD  lisinopril (PRINIVIL,ZESTRIL) 10 MG tablet TAKE 1 TABLET BY MOUTH ONCE DAILY    Shaune Pascal Bensimhon, MD  sildenafil (REVATIO) 20 MG tablet Take 2 tablets (40 mg total) by mouth 3 (three) times daily. 09/30/13   Amy D Clegg, NP  spironolactone (ALDACTONE) 25 MG tablet TAKE 1 TABLET BY MOUTH DAILY.    Jolaine Artist, MD   BP 138/61  Pulse 66  Temp(Src) 98.1 F (36.7 C) (Oral)  Resp 18  Ht 5\' 1"  (1.549 m)  Wt 184 lb 4.8 oz (83.598 kg)  BMI 34.84 kg/m2  SpO2 99%  LMP 03/01/2014  Physical Exam  Nursing note and vitals reviewed. Constitutional: She appears well-developed and well-nourished. No distress.  HENT:  Head: Normocephalic and atraumatic.  Eyes: Conjunctivae are normal. No scleral icterus.  Neck: Normal range of motion.  Cardiovascular: Normal rate, regular rhythm and normal heart sounds.   Pulmonary/Chest: Effort normal and breath sounds normal. No respiratory distress. She has no wheezes. She has no rales. She exhibits no tenderness.  Abdominal: Soft. Bowel sounds are normal. She exhibits no distension and no mass. There is tenderness (diffuse, mild). There is no rebound and no guarding.  Obese abdomen, soft, mild diffuse tenderness.  Musculoskeletal: Normal range of motion.  Neurological: She is alert.  Skin: Skin is warm and dry. She is not diaphoretic.    ED Course  Procedures (including critical care time)  DIAGNOSTIC  STUDIES: Oxygen Saturation is 99% on room air, normal by my interpretation.    COORDINATION OF CARE: 2:43 PM-Informed pt that pregnancy test is negative.  Discussed treatment plan which includes consult to attending physician with pt at bedside and pt agreed to plan.   2:54 PM-Consulted with Dr. Wyvonnia Dusky, attending, who performed bedside abdominal US.  No free fluid noted.  Discussed treatment plan which includes CBC, CMP, lipase, BNP and DG acute abdomen w/ chest.  Pt expressed understanding and agreed with plan.    Labs Review Labs Reviewed  CBC WITH DIFFERENTIAL - Abnormal; Notable for the following:    Hemoglobin 11.6 (*)    HCT 34.8 (*)    All other components within normal limits  COMPREHENSIVE METABOLIC PANEL - Abnormal; Notable for the following:    Glucose, Bld 116 (*)    GFR calc  non Af Amer 68 (*)    GFR calc Af Amer 79 (*)    All other components within normal limits  LIPASE, BLOOD  PRO B NATRIURETIC PEPTIDE  POC URINE PREG, ED    Imaging Review No results found.    EKG Interpretation None      MDM   Final diagnoses:  Amenorrhea  Weight gain    Pt presenting with concerns of amenorrhea as well as weight gain. Pt does have an obese abdomen, with mild diffuse tenderness. low concern for surgical abdomen.    Labs: CBC, CMP, Lipase, BNP: unremarkable. Urine Preg: negative.  Acute Abd w/ chest: no acute cardiopulmonary disease. Calcified uterine fibroid. Non-obstructive bowel gas pattern.   Pt may be discharged home to f/u with PCP.  Return precautions provided. Pt verbalized understanding and agreement with tx plan.  Discussed pt with Dr. Wyvonnia Dusky who also examined pt and agreed with plan.  I personally performed the services described in this documentation, which was scribed in my presence. The recorded information has been reviewed and is accurate.    Noland Fordyce, PA-C 04/17/14 1702

## 2014-04-17 NOTE — ED Notes (Signed)
Reports not having a menstrual cycle since march and wants a pregnancy test and further eval of why she isnt having her period? No acute distress noted at triage.

## 2014-04-17 NOTE — ED Notes (Signed)
PT ambulated with baseline gait; VSS; A&Ox3; no signs of distress; respirations even and unlabored; skin warm and dry; no questions upon discharge.  

## 2014-04-17 NOTE — ED Notes (Signed)
RN spoke with [name] EDP; pt to go to [facility].

## 2014-04-18 NOTE — ED Provider Notes (Signed)
Medical screening examination/treatment/procedure(s) were conducted as a shared visit with non-physician practitioner(s) and myself.  I personally evaluated the patient during the encounter.  Amenorrhea with weight gain.  Hx NICM, CHF and PAH.  No chest pain, SOB, leg swelling. No ascites seen on bedside US. Patient does not appear to be volume overloaded. JVD flat, lungs clear. No LE edema   EKG Interpretation None        Ezequiel Essex, MD 04/18/14 [phone]

## 2014-06-16 NOTE — Progress Notes (Addendum)
Patient ID: [facility], female   DOB: 28-May-1961, 53 y.o.   MRN: [id number]  Cardiologist: Dr [name] Cancel ( does not see anymore) PCP: Dr. Vista Lawman  HPI:  Ms Vigliotti is a 53 yo with a history of NICM, HTN, and chronic systolic heart failure, anemia, PAH on slidenafil and asthma.   Last cath 2005 [facility] with no CAD and PA pressures in mid 40's   CT of chest 03/18/13 no PE but prominent interstitial pattern. VQ 07/02/13 Normal PFTs done 7/14 - FVC 2.1, FEV1 1.5 FEV1/FVC 72, DLCO 12.01 (restrictive picture).  ANA, HIV, ANCA, anti-GBM Ab and RF negative  Admitted to [facility] 04/17/13 through 04/19/13 with increased dyspnea. Diuresed with IV lasix. D/C weight 160. She was re-evaluated in [facility] ED on 05/31/13. Had been off all HF medications. She was restarted on carvedilol 3.125 mg bid lasix 20 mg daily, lisinopril 10 mg daily and referred to Dr. [name] Cancel for further evaluation. He performed outpatient RHC and referred her to HF Clinic for further evaluation of PH. .  06/13/13 RHC  Mean RA: 20 mmHg  RV: 76/12 mmHg  PA: 75/36 mmHg with a mean of 49 mmHg  PCWP: 27 mmHg  Ao sat : 94% PA sat 50%  Fick Cardiac Output: 3.1 L/minute, 1.8 L/minute/M2  PVR: 8.0 wood units 567 dysnes-sec cm5   07/05/13 R/LHC RA mean 7  RV 44/11  PA 57/27, mean 40  PA (50 mcg/kg/min adenosine) 56/27, mean 41  PA (100 mcg/kg/min adenosine) 52/24, mean 37  PA (150 mcg/kg/min adenosine) 52/25, mean 38  Unable to increase to 200 mcg/kg/min due to pump problems  PCWP mean 16  LV 127/21  AO 125/73  Oxygen saturations:  PA 69%  AO 93%  Fick CO/CI: 4.21/2.61  Thermo CO/CI: 3.53/ 2.19  PVR (Fick): 5.7 WU  PVR (Thermo): 6.8 WU .  Final Conclusions: Diffuse moderate CAD. Most significant lesion is ostial RCA stenosis. Reviewed with Dr. Angelena Form. 70-80% stenosis with ostial damping in setting of relatively small caliber RCA. The RCA is diffusely disease. In the absence of ACS (no chest pain), would treat medically. Other coronary  distributions have diffuse moderate disease.   08/29/13 - 6 MW: 408 feet/124 meters 03/18/14 6MW: 750 feet/228 meters  06/17/14: 6MW: 940 ft/286 meters  Labs (07/29/13 ): K 4, creatinine 0.79 Labs (9/11/ 14 ): K 4.5 Creatinine 1.2  Labs (09/03/13) : Cholesterol 198 Triglycerides 93 HDL 47 Labs 09/30/13: K 5.4 Creatinine 1.14  Labs 10/04/13: K 3.5 Creatinine 1.2  09/2013 EF 45-50%. RV normal. No evidence PAH by echo  Had sleep study 11/05/13--> no evidence of sleep apnea.   Follow up for Heart Failure: Last visit increased coreg to 12.5 mg BID which she tolerated. Doing great. Denies SOB, PND, orthopnea or CP. Weight at home 174 lbs. Taking all medications. Able to do everything she wants to do. Able to go around the whole grocery store without stopping.   SH: single, lives in [place], nonsmoker. Disabled. FH:  No premature CAD.   ROS: All systems negative except as listed in HPI, PMH and Problem List.   Past Medical History  Diagnosis Date  . Hypertension   . Hypercholesterolemia   . Chronic systolic CHF (congestive heart failure)   . Asthma   . Nonischemic cardiomyopathy     a. 2005 Cath: nonobstructive dzs;  b. 04/2013 Echo: EF 3035%, diff HK, restrictive physiology, mildly dil LA, PASP 30mmHg.  . Microcytic anemia     Current Outpatient Prescriptions on  File Prior to Encounter  Medication Sig Dispense Refill  . albuterol (PROVENTIL HFA;VENTOLIN HFA) 108 (90 BASE) MCG/ACT inhaler Inhale 2 puffs into the lungs every 4 (four) hours as needed for wheezing or shortness of breath.      Marland Kitchen aspirin EC 81 MG EC tablet Take 1 tablet (81 mg total) by mouth daily.      Marland Kitchen atorvastatin (LIPITOR) 40 MG tablet Take 1 tablet (40 mg total) by mouth daily at 6 PM.  30 tablet  6  . Cholecalciferol (VITAMIN D PO) Take 1 tablet by mouth daily.      . ferrous sulfate 325 (65 FE) MG tablet Take 325 mg by mouth daily with breakfast.      . furosemide (LASIX) 20 MG tablet Take 40 mg by mouth 2 (two)  times daily.      Marland Kitchen lisinopril (PRINIVIL,ZESTRIL) 10 MG tablet Take 10 mg by mouth daily.      . sildenafil (REVATIO) 20 MG tablet Take 2 tablets (40 mg total) by mouth 3 (three) times daily.  180 tablet  3  . spironolactone (ALDACTONE) 25 MG tablet Take 12.5 mg by mouth daily.      . carvedilol (COREG) 12.5 MG tablet Take 1 tablet (12.5 mg total) by mouth 2 (two) times daily with a meal.  60 tablet  6   No current facility-administered medications on file prior to encounter.    Filed Vitals:   06/17/14 0854  BP: 143/68  Pulse: 60  Resp: 16  Weight: 177 lb 2 oz (80.343 kg)  SpO2: 100%   PHYSICAL EXAM: General: Well appearing. No resp difficulty  HEENT: normal  Neck: supple. JVP flat. Carotids 2+ bilaterally; no bruits. No lymphadenopathy or thryomegaly appreciated.  Cor: PMI normal. Regular rate & rhythm. 2/6 TR Lungs: clear  Abdomen: obese, soft, nontender, nondistended. No hepatosplenomegaly. No bruits or masses. Good bowel sounds.  Extremities: no cyanosis, clubbing, rash, no edema Neuro: alert & orientedx3, cranial nerves grossly intact. Moves all 4 extremities w/o difficulty. Affect pleasant.   ASSESSMENT & PLAN:   1. Chronic systolic HF: NICM, EF [id number], RV normal with no significant TR (09/2013) - NYHA I symptoms and volume status stable. Continue lasix 40 mg mg BID.  - Continue coreg 12.5 mg BID will not titrate with pulse of 60.  - Will incresae lisinopril to 10 mg BID. Check BMET and pro-BNP today and in 7-10 days. - Continue spironolactone at current dose. - Reinforced the need and importance of daily weights, a low sodium diet, and fluid restriction (less than 2 L a day). Instructed to call the HF clinic if weight increases more than 3 lbs overnight or 5 lbs in a week.  2. PAH: Class I symptoms. PVR 5.7 WU on RHC, not reactive to adenosine. Doing great. Continue sildenafil 40 mg tid. V/Q scan normal.  PFTs with restrictive picture and some concern for possible  interstitial lung disease on prior chest CT.  -Evaluated by pulmonary. Had sleep study in November 2014 with no evidence of sleep apnea. 6MW performed and improved from 228 meters to 286 meters. Repeat ECHO next visit to assess RV and PA pressures.  3. CAD: Ostial 70-80% RCA lesion (07/05/2013) No s/s of ischemia. Continue medical treatment ASA, statin and BB. 4. HTN; - SBP elevated. As above will increase lisinopril.   Follow up 4 months with ECHO.  Junie Bame B NP-C  06/17/2014 8:57 AM

## 2014-06-17 ENCOUNTER — Ambulatory Visit (HOSPITAL_COMMUNITY)
Admission: RE | Admit: 2014-06-17 | Discharge: 2014-06-17 | Disposition: A | Discharge status: Home / Self Care | Payer: Medicaid Other | Source: Ambulatory Visit | Attending: Internal Medicine | Admitting: Internal Medicine

## 2014-06-17 ENCOUNTER — Encounter (HOSPITAL_COMMUNITY): Payer: Self-pay

## 2014-06-17 ENCOUNTER — Other Ambulatory Visit (HOSPITAL_COMMUNITY): Payer: Self-pay | Admitting: Internal Medicine

## 2014-06-17 VITALS — BP 143/68 | HR 60 | Resp 16 | Wt 177.1 lb

## 2014-06-17 DIAGNOSIS — I2510 Atherosclerotic heart disease of native coronary artery without angina pectoris: Secondary | ICD-10-CM | POA: Insufficient documentation

## 2014-06-17 DIAGNOSIS — I5022 Chronic systolic (congestive) heart failure: Secondary | ICD-10-CM | POA: Diagnosis not present

## 2014-06-17 DIAGNOSIS — I10 Essential (primary) hypertension: Secondary | ICD-10-CM | POA: Insufficient documentation

## 2014-06-17 DIAGNOSIS — I2789 Other specified pulmonary heart diseases: Secondary | ICD-10-CM | POA: Diagnosis not present

## 2014-06-17 DIAGNOSIS — I272 Pulmonary hypertension, unspecified: Secondary | ICD-10-CM

## 2014-06-17 LAB — BASIC METABOLIC PANEL
BUN: 7 mg/dL (ref 6–23)
CALCIUM: 9.1 mg/dL (ref 8.4–10.5)
CO2: 22 mEq/L (ref 19–32)
CREATININE: 0.98 mg/dL (ref 0.50–1.10)
Chloride: 101 mEq/L (ref 96–112)
GFR calc Af Amer: 74 mL/min — ABNORMAL LOW (ref >90)
GFR calc non Af Amer: 64 mL/min — ABNORMAL LOW (ref >90)
Glucose, Bld: 114 mg/dL — ABNORMAL HIGH (ref 70–99)
Potassium: 3.5 mEq/L — ABNORMAL LOW (ref 3.7–5.3)
Sodium: 141 mEq/L (ref 137–147)

## 2014-06-17 LAB — PRO B NATRIURETIC PEPTIDE: PRO B NATRI PEPTIDE: 57.3 pg/mL (ref 0–125)

## 2014-06-17 MED ORDER — FUROSEMIDE 40 MG PO TABS: 40.0000 mg | ORAL_TABLET | Freq: Two times a day (BID) | ORAL | Status: DC

## 2014-06-17 MED ORDER — LISINOPRIL 10 MG PO TABS: 10.0000 mg | ORAL_TABLET | Freq: Two times a day (BID) | ORAL | Status: DC

## 2014-06-17 NOTE — Patient Instructions (Signed)
Doing fantastic.  Will increase your lisinopril to 10 mg (1 tablet) in the morning and 10 mg (1 tablet) in the evening.  Call any issues.  Have a wonderful Summer.  Follow up in 4 months with ECHO.  Do the following things EVERYDAY: 1) Weigh yourself in the morning before breakfast. Write it down and keep it in a log. 2) Take your medicines as prescribed 3) Eat low salt foods-Limit salt (sodium) to 2000 mg per day.  4) Stay as active as you can everyday 5) Limit all fluids for the day to less than 2 liters 6)

## 2014-06-17 NOTE — Progress Notes (Signed)
6 Minute Walk  Starting vitals: 100% O2 on RA, 74 HR  Walked 947ft without any rest breaks, no SOB/increased WOB, sats dropped as low as 93% briefly, HR as high as 127 briefly, patient asymptomatic, tolerated very well, steady gait and pace.  Ending vitals: 98% O2 on RA, 99 HR

## 2014-06-26 ENCOUNTER — Ambulatory Visit (HOSPITAL_COMMUNITY)
Admission: RE | Admit: 2014-06-26 | Discharge: 2014-06-26 | Disposition: A | Discharge status: Home / Self Care | Payer: Medicaid Other | Source: Ambulatory Visit | Attending: Internal Medicine | Admitting: Internal Medicine

## 2014-06-26 DIAGNOSIS — I5022 Chronic systolic (congestive) heart failure: Secondary | ICD-10-CM | POA: Insufficient documentation

## 2014-06-26 DIAGNOSIS — I509 Heart failure, unspecified: Secondary | ICD-10-CM | POA: Diagnosis not present

## 2014-06-26 LAB — BASIC METABOLIC PANEL
Anion gap: 13 (ref 5–15)
BUN: 7 mg/dL (ref 6–23)
CALCIUM: 8.9 mg/dL (ref 8.4–10.5)
CO2: 24 mEq/L (ref 19–32)
Chloride: 103 mEq/L (ref 96–112)
Creatinine, Ser: 0.95 mg/dL (ref 0.50–1.10)
GFR calc Af Amer: 77 mL/min — ABNORMAL LOW (ref >90)
GFR calc non Af Amer: 67 mL/min — ABNORMAL LOW (ref >90)
Glucose, Bld: 125 mg/dL — ABNORMAL HIGH (ref 70–99)
POTASSIUM: 4.3 meq/L (ref 3.7–5.3)
SODIUM: 140 meq/L (ref 137–147)

## 2014-07-07 ENCOUNTER — Encounter (HOSPITAL_COMMUNITY): Payer: Self-pay | Admitting: *Deleted

## 2014-07-07 ENCOUNTER — Telehealth (HOSPITAL_COMMUNITY): Payer: Self-pay | Admitting: *Deleted

## 2014-07-07 NOTE — Telephone Encounter (Signed)
Pt is due for lipid panel, letter mailed for pt to call to schedule

## 2014-09-17 ENCOUNTER — Other Ambulatory Visit (HOSPITAL_COMMUNITY): Payer: Self-pay | Admitting: Anesthesiology

## 2014-10-28 ENCOUNTER — Ambulatory Visit (HOSPITAL_COMMUNITY)
Admission: RE | Admit: 2014-10-28 | Discharge: 2014-10-28 | Disposition: A | Discharge status: Home / Self Care | Payer: Medicaid Other | Source: Ambulatory Visit | Attending: Internal Medicine | Admitting: Internal Medicine

## 2014-10-28 ENCOUNTER — Ambulatory Visit (HOSPITAL_BASED_OUTPATIENT_CLINIC_OR_DEPARTMENT_OTHER)
Admission: RE | Admit: 2014-10-28 | Discharge: 2014-10-28 | Disposition: A | Discharge status: Home / Self Care | Payer: Medicaid Other | Source: Ambulatory Visit | Attending: Cardiology | Admitting: Cardiology

## 2014-10-28 VITALS — BP 120/64 | HR 75 | Wt 181.5 lb

## 2014-10-28 DIAGNOSIS — I5022 Chronic systolic (congestive) heart failure: Secondary | ICD-10-CM | POA: Insufficient documentation

## 2014-10-28 DIAGNOSIS — I10 Essential (primary) hypertension: Secondary | ICD-10-CM | POA: Diagnosis not present

## 2014-10-28 DIAGNOSIS — I2510 Atherosclerotic heart disease of native coronary artery without angina pectoris: Secondary | ICD-10-CM | POA: Insufficient documentation

## 2014-10-28 DIAGNOSIS — E7800 Pure hypercholesterolemia, unspecified: Secondary | ICD-10-CM

## 2014-10-28 DIAGNOSIS — I272 Other secondary pulmonary hypertension: Secondary | ICD-10-CM | POA: Insufficient documentation

## 2014-10-28 DIAGNOSIS — Z7982 Long term (current) use of aspirin: Secondary | ICD-10-CM | POA: Diagnosis not present

## 2014-10-28 DIAGNOSIS — R188 Other ascites: Secondary | ICD-10-CM

## 2014-10-28 DIAGNOSIS — I429 Cardiomyopathy, unspecified: Secondary | ICD-10-CM | POA: Insufficient documentation

## 2014-10-28 DIAGNOSIS — J45909 Unspecified asthma, uncomplicated: Secondary | ICD-10-CM | POA: Diagnosis not present

## 2014-10-28 DIAGNOSIS — I428 Other cardiomyopathies: Secondary | ICD-10-CM

## 2014-10-28 DIAGNOSIS — I517 Cardiomegaly: Secondary | ICD-10-CM

## 2014-10-28 DIAGNOSIS — D509 Iron deficiency anemia, unspecified: Secondary | ICD-10-CM | POA: Insufficient documentation

## 2014-10-28 DIAGNOSIS — I270 Primary pulmonary hypertension: Secondary | ICD-10-CM

## 2014-10-28 DIAGNOSIS — Z79899 Other long term (current) drug therapy: Secondary | ICD-10-CM | POA: Diagnosis not present

## 2014-10-28 LAB — BASIC METABOLIC PANEL
Anion gap: 18 — ABNORMAL HIGH (ref 5–15)
BUN: 10 mg/dL (ref 6–23)
CHLORIDE: 103 meq/L (ref 96–112)
CO2: 24 mEq/L (ref 19–32)
Calcium: 9.4 mg/dL (ref 8.4–10.5)
Creatinine, Ser: 1.01 mg/dL (ref 0.50–1.10)
GFR calc Af Amer: 71 mL/min — ABNORMAL LOW (ref >90)
GFR calc non Af Amer: 61 mL/min — ABNORMAL LOW (ref >90)
GLUCOSE: 113 mg/dL — AB (ref 70–99)
Potassium: 3.9 mEq/L (ref 3.7–5.3)
Sodium: 145 mEq/L (ref 137–147)

## 2014-10-28 LAB — LIPID PANEL
CHOL/HDL RATIO: 6.1 ratio
CHOLESTEROL: 256 mg/dL — AB (ref 0–200)
HDL: 42 mg/dL (ref >39)
LDL Cholesterol: 161 mg/dL — ABNORMAL HIGH (ref 0–99)
Triglycerides: 265 mg/dL — ABNORMAL HIGH (ref <150)
VLDL: 53 mg/dL — ABNORMAL HIGH (ref 0–40)

## 2014-10-28 LAB — PRO B NATRIURETIC PEPTIDE: Pro B Natriuretic peptide (BNP): 50.2 pg/mL (ref 0–125)

## 2014-10-28 NOTE — Progress Notes (Signed)
6 min walk test completed, pt ambulated 1340 ft, HR ranged 76-115, O2 sats ranged from 90-99%

## 2014-10-28 NOTE — Progress Notes (Signed)
  Echocardiogram 2D Echocardiogram has been performed.  Donata Clay 10/28/2014, 1:53 PM

## 2014-10-28 NOTE — Progress Notes (Signed)
Patient ID: [facility], female   DOB: [birth year], 53 y.o.   MRN: [id number] PCP: Dr. Vista Lawman  HPI:  [name] is a 53 yo with a history of nonischemic cardiomyopathy, HTN, and chronic systolic heart failure, anemia, PAH on slidenafil and asthma.   Coronary angiography in 2005 with no CAD and RHC at that time showed PA systolic pressure in mid [id number]   CT of chest 03/18/13 no PE but prominent interstitial pattern. VQ 07/02/13 Normal PFTs done 7/14 - FVC 2.1, FEV1 1.5 FEV1/FVC 72, DLCO 12.01 (restrictive picture).  ANA, HIV, ANCA, anti-GBM Ab and RF negative  Admitted to [facility] 04/17/13 through 04/19/13 with increased dyspnea. Diuresed with IV lasix. D/C weight 160. She was re-evaluated in [facility] ED on 05/31/13. Had been off all HF medications. She was restarted on carvedilol 3.125 mg bid lasix 20 mg daily, lisinopril 10 mg daily and referred to Dr. [name] Cancel for further evaluation. He performed outpatient RHC and referred her to HF Clinic for further evaluation of PH. .  06/13/13 RHC  Mean RA: 20 mmHg  RV: 76/12 mmHg  PA: 75/36 mmHg with a mean of 49 mmHg  PCWP: 27 mmHg  Ao sat : 94% PA sat 50%  Fick Cardiac Output: 3.1 L/minute, 1.8 L/minute/M2  PVR: 8.0 wood units 567 dysnes-sec cm5   07/05/13 R/LHC RA mean 7  RV 44/11  PA 57/27, mean 40  PA (50 mcg/kg/min adenosine) 56/27, mean 41  PA (100 mcg/kg/min adenosine) 52/24, mean 37  PA (150 mcg/kg/min adenosine) 52/25, mean 38  Unable to increase to 200 mcg/kg/min due to pump problems  PCWP mean 16  LV 127/21  AO 125/73  Oxygen saturations:  PA 69%  AO 93%  Fick CO/CI: 4.21/2.61  Thermo CO/CI: 3.53/ 2.19  PVR (Fick): 5.7 WU  PVR (Thermo): 6.8 WU .  Final Conclusions: Diffuse moderate CAD. Most significant lesion is ostial RCA stenosis. Reviewed with Dr. Angelena Form. 70-80% stenosis with ostial damping in setting of relatively small caliber RCA. The RCA is diffusely disease. In the absence of ACS (no chest pain), would treat medically. Other  coronary distributions have diffuse moderate disease.   08/29/13 - 6 MW: 408 feet/124 meters 03/18/14 6MW: 750 feet/228 meters  06/17/14: 6MW: 940 ft/286 meters  Labs (07/29/13 ): K 4, creatinine 0.79 Labs (9/11/ 14 ): K 4.5 Creatinine 1.2  Labs (09/03/13) : Cholesterol 198 Triglycerides 93 HDL 47 Labs 09/30/13: K 5.4 Creatinine 1.14  Labs 10/04/13: K 3.5 Creatinine 1.2 Labs 6/15: BNP 57 Labs 7/15: K 4.3, creatinine 0.95  Echo 09/2013 EF 45-50%. RV normal. No evidence PAH by echo Echo 11/15 EF 50%, diffuse hypokinesis, normal RV size and systolic function, unable to estimate PA systolic pressure.   Had sleep study 11/05/13--> no evidence of sleep apnea.   Follow up for Heart Failure: She is doing quite well symptomatically.  No exertional dyspnea, no orthopnea/PND, no chest pain.  No lightheadedness or palpitations.   6 minute walk (10/28/14): 408 meters  SH: single, lives in [place], nonsmoker. Disabled.  FH:  No premature CAD.   ROS: All systems negative except as listed in HPI, PMH and Problem List.   Past Medical History  Diagnosis Date  . Hypertension   . Hypercholesterolemia   . Chronic systolic CHF (congestive heart failure)   . Asthma   . Nonischemic cardiomyopathy     a. 2005 Cath: nonobstructive dzs;  b. 04/2013 Echo: EF 3035%, diff HK, restrictive physiology, mildly dil LA, PASP 36mmHg.  Marland Kitchen  Microcytic anemia     Current Outpatient Prescriptions on File Prior to Encounter  Medication Sig Dispense Refill  . albuterol (PROVENTIL HFA;VENTOLIN HFA) 108 (90 BASE) MCG/ACT inhaler Inhale 2 puffs into the lungs every 4 (four) hours as needed for wheezing or shortness of breath.    Marland Kitchen aspirin EC 81 MG EC tablet Take 1 tablet (81 mg total) by mouth daily.    Marland Kitchen atorvastatin (LIPITOR) 40 MG tablet Take 1 tablet (40 mg total) by mouth daily at 6 PM. 30 tablet 6  . carvedilol (COREG) 12.5 MG tablet Take 1 tablet (12.5 mg total) by mouth 2 (two) times daily with a meal. 60 tablet 6   . Cholecalciferol (VITAMIN D PO) Take 1 tablet by mouth daily.    . ferrous sulfate 325 (65 FE) MG tablet Take 325 mg by mouth daily with breakfast.    . furosemide (LASIX) 40 MG tablet Take 1 tablet (40 mg total) by mouth 2 (two) times daily. 60 tablet 6  . lisinopril (PRINIVIL,ZESTRIL) 10 MG tablet Take 1 tablet (10 mg total) by mouth 2 (two) times daily. 60 tablet 6  . sildenafil (REVATIO) 20 MG tablet Take 2 tablets (40 mg total) by mouth 3 (three) times daily. 180 tablet 3  . spironolactone (ALDACTONE) 25 MG tablet TAKE 1/2 TABLET BY MOUTH ONCE DAILY 15 tablet 2   No current facility-administered medications on file prior to encounter.    Filed Vitals:   10/28/14 1341  BP: 120/64  Pulse: 75  Weight: 181 lb 8 oz (82.328 kg)  SpO2: 98%   PHYSICAL EXAM: General: Well appearing. No resp difficulty  HEENT: normal  Neck: supple. JVP flat. Carotids 2+ bilaterally; no bruits. No lymphadenopathy or thryomegaly appreciated.  Cor: PMI normal. Regular rate & rhythm. 2/6 TR Lungs: clear  Abdomen: obese, soft, nontender, nondistended. No hepatosplenomegaly. No bruits or masses. Good bowel sounds.  Extremities: no cyanosis, clubbing, rash, no edema Neuro: alert & orientedx3, cranial nerves grossly intact. Moves all 4 extremities w/o difficulty. Affect pleasant.   ASSESSMENT & PLAN:   1. Chronic systolic HF: Nonischemic cardiomyopathy (CMP out of proportion to CAD).  EF 50% with RV normal and unable to estimate PA systolic pressure (no TR jet) on today's echo. NYHA I symptoms and volume status stable.  - Continue current Lasix, lisinopril, Coreg, and spironolactone.  - BMET/BNP today.  - Reinforced the need and importance of daily weights, a low sodium diet, and fluid restriction (less than 2 L a day). Instructed to call the HF clinic if weight increases more than 3 lbs overnight or 5 lbs in a week.  2. PAH: Suspect combination of group 1 and group 2 pulmonary hypertension. PVR 5.7 WU on RHC  in 7/14, not reactive to adenosine. V/Q scan normal.  PFTs with restrictive picture and some concern for possible interstitial lung disease on prior chest CT.  Evaluated by pulmonary. Had sleep study in November 2014 with no evidence of sleep apnea. Excellent 6 minute walk today with 408 meters. Echo today showed normal RV, unable to estimate PA systolic pressure.  - Check BNP today.  - Continue current sildenafil 3. CAD: Ostial 70-80% RCA lesion (07/05/2013).  No chest pain.  Historically, cardiomyopathy has been out of proportion to CAD. Continue medical treatment with ASA 81 and statin. Will check lipids today.  4. HTN: BP controlled.   Loralie Champagne 10/28/2014

## 2014-10-28 NOTE — Patient Instructions (Signed)
Labs today  We will contact you in 4 months to schedule your next appointment.  

## 2014-10-30 ENCOUNTER — Telehealth (HOSPITAL_COMMUNITY): Payer: Self-pay | Admitting: Cardiology

## 2014-10-30 DIAGNOSIS — E785 Hyperlipidemia, unspecified: Secondary | ICD-10-CM

## 2014-10-30 MED ORDER — ROSUVASTATIN CALCIUM 40 MG PO TABS: 40.0000 mg | ORAL_TABLET | Freq: Every day | ORAL | Status: DC

## 2014-10-30 NOTE — Telephone Encounter (Signed)
New rx for lipids sent into pharm- [facility] Pt aware

## 2014-11-12 ENCOUNTER — Telehealth (HOSPITAL_COMMUNITY): Payer: Self-pay | Admitting: *Deleted

## 2014-11-12 NOTE — Telephone Encounter (Signed)
PA for Crestor completed and approved, approval # [id number], pt and [facility] aware

## 2014-11-17 ENCOUNTER — Other Ambulatory Visit (HOSPITAL_COMMUNITY): Payer: Self-pay | Admitting: Adult Health

## 2014-11-27 ENCOUNTER — Encounter (HOSPITAL_COMMUNITY): Payer: Self-pay | Admitting: Cardiovascular Disease

## 2014-12-18 ENCOUNTER — Other Ambulatory Visit (HOSPITAL_COMMUNITY): Payer: Self-pay | Admitting: Anesthesiology

## 2014-12-22 ENCOUNTER — Telehealth (HOSPITAL_COMMUNITY): Payer: Self-pay | Admitting: Vascular Surgery

## 2014-12-22 NOTE — Telephone Encounter (Signed)
Webb Silversmith from [facility] called she has not receive telemonitoring orders for the pt if she does not receive orders by Friday 12/26/14 pt Telemonitoring  will be suspended.. Please advise

## 2014-12-24 NOTE — Telephone Encounter (Signed)
Orders faxed

## 2015-01-01 ENCOUNTER — Encounter (HOSPITAL_COMMUNITY): Payer: Self-pay | Admitting: Gastroenterology

## 2015-01-07 ENCOUNTER — Other Ambulatory Visit: Payer: Self-pay | Admitting: Obstetrics and Gynecology

## 2015-01-08 ENCOUNTER — Telehealth (HOSPITAL_COMMUNITY): Payer: Self-pay | Admitting: *Deleted

## 2015-01-08 NOTE — Telephone Encounter (Signed)
Received form from Dr Garwin Brothers, pt needs clearance for robotic hysterectomy, per Dr Haroldine Laws pt at moderate risk for cv complications ok to proceed, note faxed to Dorthula Matas at [phone]

## 2015-01-12 NOTE — Patient Instructions (Addendum)
   Your procedure is scheduled on:  Thursday, Jan 28  Enter through the Main Entrance of [facility] at: 6 AM Pick up the phone at the desk and dial [phone] and inform us of your arrival.  Please call this number if you have any problems the morning of surgery: [phone]  Remember: Do not eat or drink after midnight: Wednesday Take these medicines the morning of surgery with a SIP OF WATER: carvedilol, lasix, lisinopril, spironolactone, sildenafil.  Bring albuterol inhaler with you on day of surgery.  Do not wear jewelry, make-up, or FINGER nail polish No metal in your hair or on your body. Do not wear lotions, powders, perfumes.  You may wear deodorant.  Do not bring valuables to the hospital. Contacts, dentures or bridgework may not be worn into surgery.  Leave suitcase in the car. After Surgery it may be brought to your room. For patients being admitted to the hospital, checkout time is 11:00am the day of discharge.  Home with boyfriend Beverely Low cell [phone].

## 2015-01-13 ENCOUNTER — Encounter (HOSPITAL_COMMUNITY): Payer: Self-pay

## 2015-01-13 ENCOUNTER — Other Ambulatory Visit: Payer: Self-pay

## 2015-01-13 ENCOUNTER — Encounter (HOSPITAL_COMMUNITY)
Admission: RE | Admit: 2015-01-13 | Discharge: 2015-01-13 | Disposition: A | Discharge status: Home / Self Care | Payer: Medicaid Other | Source: Ambulatory Visit | Attending: Obstetrics and Gynecology | Admitting: Obstetrics and Gynecology

## 2015-01-13 DIAGNOSIS — K219 Gastro-esophageal reflux disease without esophagitis: Secondary | ICD-10-CM | POA: Diagnosis not present

## 2015-01-13 DIAGNOSIS — N838 Other noninflammatory disorders of ovary, fallopian tube and broad ligament: Secondary | ICD-10-CM | POA: Diagnosis not present

## 2015-01-13 DIAGNOSIS — I2510 Atherosclerotic heart disease of native coronary artery without angina pectoris: Secondary | ICD-10-CM | POA: Diagnosis not present

## 2015-01-13 DIAGNOSIS — E119 Type 2 diabetes mellitus without complications: Secondary | ICD-10-CM | POA: Diagnosis not present

## 2015-01-13 DIAGNOSIS — I10 Essential (primary) hypertension: Secondary | ICD-10-CM | POA: Diagnosis not present

## 2015-01-13 DIAGNOSIS — D649 Anemia, unspecified: Secondary | ICD-10-CM | POA: Diagnosis not present

## 2015-01-13 DIAGNOSIS — J45909 Unspecified asthma, uncomplicated: Secondary | ICD-10-CM | POA: Diagnosis not present

## 2015-01-13 DIAGNOSIS — D259 Leiomyoma of uterus, unspecified: Secondary | ICD-10-CM | POA: Diagnosis not present

## 2015-01-13 DIAGNOSIS — I509 Heart failure, unspecified: Secondary | ICD-10-CM | POA: Diagnosis not present

## 2015-01-13 HISTORY — DX: Type 2 diabetes mellitus without complications: E11.9

## 2015-01-13 LAB — BASIC METABOLIC PANEL
ANION GAP: 10 (ref 5–15)
BUN: 13 mg/dL (ref 6–23)
CO2: 29 mmol/L (ref 19–32)
CREATININE: 1.34 mg/dL — AB (ref 0.50–1.10)
Calcium: 9.4 mg/dL (ref 8.4–10.5)
Chloride: 101 mmol/L (ref 96–112)
GFR, EST AFRICAN AMERICAN: 51 mL/min — AB (ref >90)
GFR, EST NON AFRICAN AMERICAN: 44 mL/min — AB (ref >90)
Glucose, Bld: 173 mg/dL — ABNORMAL HIGH (ref 70–99)
Potassium: 3.4 mmol/L — ABNORMAL LOW (ref 3.5–5.1)
Sodium: 140 mmol/L (ref 135–145)

## 2015-01-13 LAB — CBC
HCT: 34.9 % — ABNORMAL LOW (ref 36.0–46.0)
HEMOGLOBIN: 11.6 g/dL — AB (ref 12.0–15.0)
MCH: 29.1 pg (ref 26.0–34.0)
MCHC: 33.2 g/dL (ref 30.0–36.0)
MCV: 87.5 fL (ref 78.0–100.0)
Platelets: 373 10*3/uL (ref 150–400)
RBC: 3.99 MIL/uL (ref 3.87–5.11)
RDW: 13.5 % (ref 11.5–15.5)
WBC: 9.4 10*3/uL (ref 4.0–10.5)

## 2015-01-13 NOTE — Pre-Procedure Instructions (Signed)
Reviewed patient's history and ekg results with Dr. Jillyn Hidden.  Rozel for surgery with clearance.

## 2015-01-13 NOTE — Pre-Procedure Instructions (Signed)
Surgical clearance placed on chart from cardiologist.

## 2015-01-14 NOTE — Anesthesia Preprocedure Evaluation (Addendum)
Anesthesia Evaluation  Patient identified by MRN, date of birth, ID band Patient awake    Reviewed: Allergy & Precautions, H&P , Patient's Chart, lab work & pertinent test results, reviewed documented beta blocker date and time   History of Anesthesia Complications Negative for: history of anesthetic complications  Airway Mallampati: II  TM Distance: >3 FB Neck ROM: full    Dental   Pulmonary asthma ,  breath sounds clear to auscultation        Cardiovascular Exercise Tolerance: Good hypertension, + CAD and +CHF Rhythm:regular Rate:Normal     Neuro/Psych    GI/Hepatic GERD-  ,  Endo/Other  diabetes  Renal/GU      Musculoskeletal   Abdominal   Peds  Hematology  (+) anemia ,   Anesthesia Other Findings PHTN  Reproductive/Obstetrics                           Anesthesia Physical Anesthesia Plan  ASA: III  Anesthesia Plan: General ETT   Post-op Pain Management:    Induction:   Airway Management Planned: Oral ETT  Additional Equipment: Arterial line  Intra-op Plan:   Post-operative Plan:   Informed Consent: I have reviewed the patients History and Physical, chart, labs and discussed the procedure including the risks, benefits and alternatives for the proposed anesthesia with the patient or authorized representative who has indicated his/her understanding and acceptance.   Dental Advisory Given  Plan Discussed with: CRNA and Surgeon  Anesthesia Plan Comments:         Anesthesia Quick Evaluation Keep normocarbic, 100% FiO2, low threshold to abort to open procedure if abdominal insufflation too difficult for patient to tolerate

## 2015-01-15 ENCOUNTER — Ambulatory Visit (HOSPITAL_COMMUNITY): Payer: Medicaid Other | Admitting: Anesthesiology

## 2015-01-15 ENCOUNTER — Encounter (HOSPITAL_COMMUNITY): Payer: Self-pay | Admitting: *Deleted

## 2015-01-15 ENCOUNTER — Ambulatory Visit (HOSPITAL_COMMUNITY)
Admission: RE | Admit: 2015-01-15 | Discharge: 2015-01-16 | Disposition: A | Discharge status: Home / Self Care | Payer: Medicaid Other | Source: Ambulatory Visit | Attending: Obstetrics and Gynecology | Admitting: Obstetrics and Gynecology

## 2015-01-15 ENCOUNTER — Encounter (HOSPITAL_COMMUNITY)
Admission: RE | Disposition: A | Discharge status: Home / Self Care | Payer: Self-pay | Source: Ambulatory Visit | Attending: Obstetrics and Gynecology

## 2015-01-15 DIAGNOSIS — I150 Renovascular hypertension: Secondary | ICD-10-CM

## 2015-01-15 DIAGNOSIS — J45909 Unspecified asthma, uncomplicated: Secondary | ICD-10-CM | POA: Insufficient documentation

## 2015-01-15 DIAGNOSIS — K219 Gastro-esophageal reflux disease without esophagitis: Secondary | ICD-10-CM

## 2015-01-15 DIAGNOSIS — D509 Iron deficiency anemia, unspecified: Secondary | ICD-10-CM

## 2015-01-15 DIAGNOSIS — E7800 Pure hypercholesterolemia, unspecified: Secondary | ICD-10-CM

## 2015-01-15 DIAGNOSIS — I2510 Atherosclerotic heart disease of native coronary artery without angina pectoris: Secondary | ICD-10-CM | POA: Insufficient documentation

## 2015-01-15 DIAGNOSIS — I428 Other cardiomyopathies: Secondary | ICD-10-CM

## 2015-01-15 DIAGNOSIS — D649 Anemia, unspecified: Secondary | ICD-10-CM | POA: Insufficient documentation

## 2015-01-15 DIAGNOSIS — D259 Leiomyoma of uterus, unspecified: Secondary | ICD-10-CM | POA: Insufficient documentation

## 2015-01-15 DIAGNOSIS — N838 Other noninflammatory disorders of ovary, fallopian tube and broad ligament: Secondary | ICD-10-CM | POA: Insufficient documentation

## 2015-01-15 DIAGNOSIS — I272 Pulmonary hypertension, unspecified: Secondary | ICD-10-CM

## 2015-01-15 DIAGNOSIS — I10 Essential (primary) hypertension: Secondary | ICD-10-CM | POA: Insufficient documentation

## 2015-01-15 DIAGNOSIS — Z90710 Acquired absence of both cervix and uterus: Secondary | ICD-10-CM

## 2015-01-15 DIAGNOSIS — I5023 Acute on chronic systolic (congestive) heart failure: Secondary | ICD-10-CM

## 2015-01-15 DIAGNOSIS — E119 Type 2 diabetes mellitus without complications: Secondary | ICD-10-CM | POA: Insufficient documentation

## 2015-01-15 DIAGNOSIS — I509 Heart failure, unspecified: Secondary | ICD-10-CM | POA: Insufficient documentation

## 2015-01-15 HISTORY — PX: BILATERAL SALPINGECTOMY: SHX5743

## 2015-01-15 HISTORY — PX: ROBOTIC ASSISTED TOTAL HYSTERECTOMY: SHX6085

## 2015-01-15 LAB — BLOOD GAS, ARTERIAL
ACID-BASE EXCESS: 3.2 mmol/L — AB (ref 0.0–2.0)
Acid-Base Excess: 3.9 mmol/L — ABNORMAL HIGH (ref 0.0–2.0)
BICARBONATE: 26.8 meq/L — AB (ref 20.0–24.0)
Bicarbonate: 25 mEq/L — ABNORMAL HIGH (ref 20.0–24.0)
DRAWN BY: 29165
Drawn by: 291651
FIO2: 0.5 %
FIO2: 0.54 %
LHR: 10 {breaths}/min
MECHVT: 550 mL
O2 SAT: 100 %
O2 Saturation: 100 %
PCO2 ART: 35.2 mmHg (ref 35.0–45.0)
PH ART: 7.493 — AB (ref 7.350–7.450)
PO2 ART: 228 mmHg — AB (ref 80.0–100.0)
RATE: 10 resp/min
TCO2: 25.9 mmol/L (ref 0–100)
TCO2: 27.9 mmol/L (ref 0–100)
VT: 600 mL
pCO2 arterial: 30.6 mmHg — ABNORMAL LOW (ref 35.0–45.0)
pH, Arterial: 7.522 — ABNORMAL HIGH (ref 7.350–7.450)
pO2, Arterial: 201 mmHg — ABNORMAL HIGH (ref 80.0–100.0)

## 2015-01-15 LAB — GLUCOSE, CAPILLARY
GLUCOSE-CAPILLARY: 174 mg/dL — AB (ref 70–99)
GLUCOSE-CAPILLARY: 191 mg/dL — AB (ref 70–99)

## 2015-01-15 SURGERY — ROBOTIC ASSISTED TOTAL HYSTERECTOMY
Anesthesia: General | Site: Abdomen

## 2015-01-15 MED ORDER — FERROUS SULFATE 325 (65 FE) MG PO TABS
325.0000 mg | ORAL_TABLET | Freq: Every day | ORAL | Status: DC
  Administered 2015-01-16: 325 mg via ORAL
  Filled 2015-01-15: qty 1

## 2015-01-15 MED ORDER — CARVEDILOL 12.5 MG PO TABS
12.5000 mg | ORAL_TABLET | Freq: Two times a day (BID) | ORAL | Status: DC
  Administered 2015-01-15: 12.5 mg via ORAL
  Filled 2015-01-15 (×2): qty 1

## 2015-01-15 MED ORDER — HYDROMORPHONE HCL 1 MG/ML IJ SOLN
INTRAMUSCULAR | Status: DC | PRN
  Administered 2015-01-15: 1 mg via INTRAVENOUS

## 2015-01-15 MED ORDER — NEOSTIGMINE METHYLSULFATE 10 MG/10ML IV SOLN
INTRAVENOUS | Status: DC | PRN
Start: 2015-01-15 — End: 2015-01-15
  Administered 2015-01-15: 4 mg via INTRAVENOUS

## 2015-01-15 MED ORDER — DOCUSATE SODIUM 100 MG PO CAPS
100.0000 mg | ORAL_CAPSULE | Freq: Two times a day (BID) | ORAL | Status: DC
  Administered 2015-01-15: 100 mg via ORAL
  Filled 2015-01-15: qty 1

## 2015-01-15 MED ORDER — SILDENAFIL CITRATE 20 MG PO TABS
40.0000 mg | ORAL_TABLET | Freq: Three times a day (TID) | ORAL | Status: DC
  Administered 2015-01-15: 40 mg via ORAL
  Filled 2015-01-15 (×3): qty 2

## 2015-01-15 MED ORDER — GLYCOPYRROLATE 0.2 MG/ML IJ SOLN
INTRAMUSCULAR | Status: AC
  Filled 2015-01-15: qty 2

## 2015-01-15 MED ORDER — SODIUM CHLORIDE 0.9 % IV SOLN
INTRAVENOUS | Status: DC | PRN
  Administered 2015-01-15: 60 mL

## 2015-01-15 MED ORDER — ONDANSETRON HCL 4 MG/2ML IJ SOLN
INTRAMUSCULAR | Status: AC
  Filled 2015-01-15: qty 2

## 2015-01-15 MED ORDER — LIDOCAINE HCL (CARDIAC) 20 MG/ML IV SOLN
INTRAVENOUS | Status: DC | PRN
  Administered 2015-01-15: 50 mg via INTRAVENOUS

## 2015-01-15 MED ORDER — SODIUM CHLORIDE 0.9 % IJ SOLN
INTRAMUSCULAR | Status: AC
  Filled 2015-01-15: qty 50

## 2015-01-15 MED ORDER — CEFAZOLIN SODIUM-DEXTROSE 2-3 GM-% IV SOLR
2.0000 g | INTRAVENOUS | Status: AC
  Administered 2015-01-15: 2 g via INTRAVENOUS

## 2015-01-15 MED ORDER — ONDANSETRON HCL 4 MG/2ML IJ SOLN
INTRAMUSCULAR | Status: DC | PRN
  Administered 2015-01-15: 4 mg via INTRAVENOUS

## 2015-01-15 MED ORDER — LACTATED RINGERS IR SOLN
Status: DC | PRN
  Administered 2015-01-15: 3000 mL

## 2015-01-15 MED ORDER — METHYLENE BLUE 1 % INJ SOLN
INTRAMUSCULAR | Status: AC
  Filled 2015-01-15: qty 1

## 2015-01-15 MED ORDER — ETOMIDATE 2 MG/ML IV SOLN
INTRAVENOUS | Status: AC
  Filled 2015-01-15: qty 10

## 2015-01-15 MED ORDER — MIDAZOLAM HCL 2 MG/2ML IJ SOLN
INTRAMUSCULAR | Status: AC
  Filled 2015-01-15: qty 2

## 2015-01-15 MED ORDER — STERILE WATER FOR IRRIGATION IR SOLN
Status: DC | PRN
  Administered 2015-01-15 (×3): 1000 mL

## 2015-01-15 MED ORDER — LIDOCAINE HCL (CARDIAC) 20 MG/ML IV SOLN
INTRAVENOUS | Status: AC
  Filled 2015-01-15: qty 5

## 2015-01-15 MED ORDER — EPHEDRINE SULFATE 50 MG/ML IJ SOLN
INTRAMUSCULAR | Status: DC | PRN
  Administered 2015-01-15 (×2): 5 mg via INTRAVENOUS

## 2015-01-15 MED ORDER — SPIRONOLACTONE 12.5 MG HALF TABLET
12.5000 mg | ORAL_TABLET | Freq: Every day | ORAL | Status: DC
  Filled 2015-01-15 (×2): qty 1

## 2015-01-15 MED ORDER — PHENYLEPHRINE 8 MG IN D5W 100 ML (0.08MG/ML) PREMIX OPTIME
INJECTION | INTRAVENOUS | Status: AC
Start: 2015-01-15 — End: 2015-01-15
  Filled 2015-01-15: qty 100

## 2015-01-15 MED ORDER — EPHEDRINE 5 MG/ML INJ
INTRAVENOUS | Status: AC
  Filled 2015-01-15: qty 10

## 2015-01-15 MED ORDER — PHENYLEPHRINE HCL 10 MG/ML IJ SOLN
INTRAMUSCULAR | Status: DC | PRN
  Administered 2015-01-15 (×3): 80 mg via INTRAVENOUS
  Administered 2015-01-15 (×2): 40 mg via INTRAVENOUS
  Administered 2015-01-15: 80 mg via INTRAVENOUS

## 2015-01-15 MED ORDER — NEOSTIGMINE METHYLSULFATE 10 MG/10ML IV SOLN
INTRAVENOUS | Status: AC
  Filled 2015-01-15: qty 1

## 2015-01-15 MED ORDER — CEFAZOLIN SODIUM-DEXTROSE 2-3 GM-% IV SOLR
INTRAVENOUS | Status: AC
Start: 2015-01-15 — End: 2015-01-15
  Filled 2015-01-15: qty 50

## 2015-01-15 MED ORDER — LACTATED RINGERS IV SOLN
INTRAVENOUS | Status: DC
  Administered 2015-01-15: 1000 mL via INTRAVENOUS
  Administered 2015-01-15 (×2): via INTRAVENOUS

## 2015-01-15 MED ORDER — ROSUVASTATIN CALCIUM 40 MG PO TABS
40.0000 mg | ORAL_TABLET | Freq: Every day | ORAL | Status: DC
  Administered 2015-01-15: 40 mg via ORAL
  Filled 2015-01-15: qty 1

## 2015-01-15 MED ORDER — LACTATED RINGERS IV SOLN
INTRAVENOUS | Status: DC | PRN
  Administered 2015-01-15: 07:00:00 via INTRAVENOUS

## 2015-01-15 MED ORDER — HYDROMORPHONE HCL 1 MG/ML IJ SOLN
INTRAMUSCULAR | Status: AC
  Filled 2015-01-15: qty 1

## 2015-01-15 MED ORDER — HYDROMORPHONE HCL 1 MG/ML IJ SOLN
0.2500 mg | INTRAMUSCULAR | Status: DC | PRN
Start: 2015-01-15 — End: 2015-01-15

## 2015-01-15 MED ORDER — ONDANSETRON HCL 4 MG PO TABS: 4.0000 mg | ORAL_TABLET | Freq: Four times a day (QID) | ORAL | Status: DC | PRN

## 2015-01-15 MED ORDER — ONDANSETRON HCL 4 MG/2ML IJ SOLN
4.0000 mg | Freq: Once | INTRAMUSCULAR | Status: AC
  Administered 2015-01-15: 4 mg via INTRAVENOUS

## 2015-01-15 MED ORDER — DEXTROSE IN LACTATED RINGERS 5 % IV SOLN
INTRAVENOUS | Status: DC
  Administered 2015-01-15 (×2): via INTRAVENOUS

## 2015-01-15 MED ORDER — FUROSEMIDE 40 MG PO TABS
40.0000 mg | ORAL_TABLET | Freq: Two times a day (BID) | ORAL | Status: DC
  Administered 2015-01-15 – 2015-01-16 (×2): 40 mg via ORAL
  Filled 2015-01-15 (×2): qty 1

## 2015-01-15 MED ORDER — ALBUTEROL SULFATE (2.5 MG/3ML) 0.083% IN NEBU: 3.0000 mL | INHALATION_SOLUTION | RESPIRATORY_TRACT | Status: DC | PRN

## 2015-01-15 MED ORDER — MIDAZOLAM HCL 2 MG/2ML IJ SOLN
INTRAMUSCULAR | Status: DC | PRN
  Administered 2015-01-15: 2 mg via INTRAVENOUS

## 2015-01-15 MED ORDER — PROMETHAZINE HCL 25 MG/ML IJ SOLN: 6.2500 mg | Freq: Once | INTRAMUSCULAR | Status: DC

## 2015-01-15 MED ORDER — PHENYLEPHRINE HCL 10 MG/ML IJ SOLN
20.0000 mg | INTRAVENOUS | Status: DC | PRN
  Administered 2015-01-15: 10 ug/min via INTRAVENOUS

## 2015-01-15 MED ORDER — ARTIFICIAL TEARS OP OINT
TOPICAL_OINTMENT | OPHTHALMIC | Status: AC
  Filled 2015-01-15: qty 3.5

## 2015-01-15 MED ORDER — ARTIFICIAL TEARS OP OINT
TOPICAL_OINTMENT | OPHTHALMIC | Status: DC | PRN
  Administered 2015-01-15: 1 via OPHTHALMIC

## 2015-01-15 MED ORDER — FENTANYL CITRATE 0.05 MG/ML IJ SOLN
INTRAMUSCULAR | Status: DC | PRN
  Administered 2015-01-15: 100 ug via INTRAVENOUS
  Administered 2015-01-15: 50 ug via INTRAVENOUS
  Administered 2015-01-15: 100 ug via INTRAVENOUS

## 2015-01-15 MED ORDER — DEXTROSE IN LACTATED RINGERS 5 % IV SOLN: INTRAVENOUS | Status: DC

## 2015-01-15 MED ORDER — ROPIVACAINE HCL 5 MG/ML IJ SOLN
INTRAMUSCULAR | Status: AC
  Filled 2015-01-15: qty 60

## 2015-01-15 MED ORDER — FENTANYL CITRATE 0.05 MG/ML IJ SOLN
INTRAMUSCULAR | Status: AC
  Filled 2015-01-15: qty 5

## 2015-01-15 MED ORDER — SODIUM CHLORIDE 0.9 % IJ SOLN
INTRAMUSCULAR | Status: AC
  Filled 2015-01-15: qty 10

## 2015-01-15 MED ORDER — GLYCOPYRROLATE 0.2 MG/ML IJ SOLN
INTRAMUSCULAR | Status: DC | PRN
  Administered 2015-01-15: 0.6 mg via INTRAVENOUS

## 2015-01-15 MED ORDER — ONDANSETRON HCL 4 MG/2ML IJ SOLN
4.0000 mg | Freq: Four times a day (QID) | INTRAMUSCULAR | Status: DC | PRN
  Administered 2015-01-15: 4 mg via INTRAVENOUS
  Filled 2015-01-15: qty 2

## 2015-01-15 MED ORDER — SCOPOLAMINE 1 MG/3DAYS TD PT72
MEDICATED_PATCH | TRANSDERMAL | Status: AC
  Filled 2015-01-15: qty 1

## 2015-01-15 MED ORDER — BUPIVACAINE HCL (PF) 0.25 % IJ SOLN
INTRAMUSCULAR | Status: DC | PRN
  Administered 2015-01-15: 6 mL

## 2015-01-15 MED ORDER — ALBUTEROL SULFATE HFA 108 (90 BASE) MCG/ACT IN AERS
INHALATION_SPRAY | RESPIRATORY_TRACT | Status: DC | PRN
  Administered 2015-01-15: 2 via RESPIRATORY_TRACT

## 2015-01-15 MED ORDER — ROCURONIUM BROMIDE 100 MG/10ML IV SOLN
INTRAVENOUS | Status: DC | PRN
  Administered 2015-01-15: 20 mg via INTRAVENOUS
  Administered 2015-01-15: 10 mg via INTRAVENOUS
  Administered 2015-01-15: 50 mg via INTRAVENOUS

## 2015-01-15 MED ORDER — PROPOFOL 10 MG/ML IV BOLUS
INTRAVENOUS | Status: AC
  Filled 2015-01-15: qty 20

## 2015-01-15 MED ORDER — PHENYLEPHRINE 40 MCG/ML (10ML) SYRINGE FOR IV PUSH (FOR BLOOD PRESSURE SUPPORT)
PREFILLED_SYRINGE | INTRAVENOUS | Status: AC
  Filled 2015-01-15: qty 20

## 2015-01-15 MED ORDER — BUPIVACAINE HCL (PF) 0.25 % IJ SOLN
INTRAMUSCULAR | Status: AC
  Filled 2015-01-15: qty 30

## 2015-01-15 MED ORDER — MENTHOL 3 MG MT LOZG: 1.0000 | LOZENGE | OROMUCOSAL | Status: DC | PRN

## 2015-01-15 MED ORDER — OXYCODONE-ACETAMINOPHEN 5-325 MG PO TABS: 1.0000 | ORAL_TABLET | ORAL | Status: DC | PRN

## 2015-01-15 MED ORDER — LISINOPRIL 10 MG PO TABS
10.0000 mg | ORAL_TABLET | Freq: Two times a day (BID) | ORAL | Status: DC
  Administered 2015-01-15: 10 mg via ORAL
  Filled 2015-01-15 (×3): qty 1

## 2015-01-15 MED ORDER — SCOPOLAMINE 1 MG/3DAYS TD PT72
1.0000 | MEDICATED_PATCH | Freq: Once | TRANSDERMAL | Status: DC
  Administered 2015-01-15: 1.5 mg via TRANSDERMAL

## 2015-01-15 MED ORDER — PANTOPRAZOLE SODIUM 40 MG PO TBEC
40.0000 mg | DELAYED_RELEASE_TABLET | Freq: Every day | ORAL | Status: DC
  Filled 2015-01-15: qty 1

## 2015-01-15 MED ORDER — HYDROMORPHONE HCL 1 MG/ML IJ SOLN: 0.2000 mg | INTRAMUSCULAR | Status: DC | PRN

## 2015-01-15 MED ORDER — SIMETHICONE 80 MG PO CHEW: 80.0000 mg | CHEWABLE_TABLET | Freq: Four times a day (QID) | ORAL | Status: DC | PRN

## 2015-01-15 MED ORDER — PROPOFOL 10 MG/ML IV BOLUS
INTRAVENOUS | Status: DC | PRN
  Administered 2015-01-15: 140 mg via INTRAVENOUS

## 2015-01-15 MED ORDER — ROCURONIUM BROMIDE 100 MG/10ML IV SOLN
INTRAVENOUS | Status: AC
  Filled 2015-01-15: qty 1

## 2015-01-15 SURGICAL SUPPLY — 63 items
BARRIER ADHS 3X4 INTERCEED (GAUZE/BANDAGES/DRESSINGS) ×2 IMPLANT
BLADE SURG 11 STRL SS (BLADE) ×1 IMPLANT
BRR ADH 4X3 ABS CNTRL BYND (GAUZE/BANDAGES/DRESSINGS)
CATH FOLEY 3WAY  5CC 16FR (CATHETERS) ×1
CATH FOLEY 3WAY 5CC 16FR (CATHETERS) ×2 IMPLANT
CLOTH BEACON ORANGE TIMEOUT ST (SAFETY) ×3 IMPLANT
CONT PATH 16OZ SNAP LID 3702 (MISCELLANEOUS) ×3 IMPLANT
COVER BACK TABLE 60X90IN (DRAPES) ×6 IMPLANT
COVER TIP SHEARS 8 DVNC (MISCELLANEOUS) ×2 IMPLANT
COVER TIP SHEARS 8MM DA VINCI (MISCELLANEOUS) ×1
DECANTER SPIKE VIAL GLASS SM (MISCELLANEOUS) ×7 IMPLANT
DEVICE TROCAR PUNCTURE CLOSURE (ENDOMECHANICALS) IMPLANT
DRAPE WARM FLUID 44X44 (DRAPE) ×3 IMPLANT
DRSG OPSITE POSTOP 3X4 (GAUZE/BANDAGES/DRESSINGS) ×1 IMPLANT
DURAPREP 26ML APPLICATOR (WOUND CARE) ×3 IMPLANT
ELECT REM PT RETURN 9FT ADLT (ELECTROSURGICAL) ×3
ELECTRODE REM PT RTRN 9FT ADLT (ELECTROSURGICAL) ×2 IMPLANT
FILTER SMOKE EVAC LAPAROSHD (FILTER) ×1 IMPLANT
GAUZE VASELINE 3X9 (GAUZE/BANDAGES/DRESSINGS) IMPLANT
GLOVE BIO SURGEON STRL SZ7 (GLOVE) ×1 IMPLANT
GLOVE BIOGEL PI IND STRL 6.5 (GLOVE) IMPLANT
GLOVE BIOGEL PI IND STRL 7.0 (GLOVE) ×8 IMPLANT
GLOVE BIOGEL PI INDICATOR 6.5 (GLOVE) ×2
GLOVE BIOGEL PI INDICATOR 7.0 (GLOVE) ×7
GLOVE ECLIPSE 6.5 STRL STRAW (GLOVE) ×4 IMPLANT
GLOVE ECLIPSE 7.0 STRL STRAW (GLOVE) ×1 IMPLANT
KIT ACCESSORY DA VINCI DISP (KITS) ×1
KIT ACCESSORY DVNC DISP (KITS) ×2 IMPLANT
LEGGING LITHOTOMY PAIR STRL (DRAPES) ×3 IMPLANT
LIQUID BAND (GAUZE/BANDAGES/DRESSINGS) ×3 IMPLANT
NEEDLE INSUFFLATION 150MM (ENDOMECHANICALS) ×3 IMPLANT
OCCLUDER COLPOPNEUMO (BALLOONS) ×1 IMPLANT
PACK ROBOT WH (CUSTOM PROCEDURE TRAY) ×3 IMPLANT
PACK ROBOTIC GOWN (GOWN DISPOSABLE) ×3 IMPLANT
PAD POSITIONER PINK NONSTERILE (MISCELLANEOUS) ×3 IMPLANT
PAD PREP 24X48 CUFFED NSTRL (MISCELLANEOUS) ×6 IMPLANT
PROTECTOR NERVE ULNAR (MISCELLANEOUS) ×6 IMPLANT
SCISSORS LAP 5X35 DISP (ENDOMECHANICALS) IMPLANT
SET CYSTO W/LG BORE CLAMP LF (SET/KITS/TRAYS/PACK) IMPLANT
SET IRRIG TUBING LAPAROSCOPIC (IRRIGATION / IRRIGATOR) ×3 IMPLANT
SET TRI-LUMEN FLTR TB AIRSEAL (TUBING) ×1 IMPLANT
SUT VIC AB 0 CT1 36 (SUTURE) ×7 IMPLANT
SUT VICRYL 0 UR6 27IN ABS (SUTURE) ×4 IMPLANT
SUT VICRYL 4-0 PS2 18IN ABS (SUTURE) ×7 IMPLANT
SUT VLOC 180 0 9IN  GS21 (SUTURE) ×1
SUT VLOC 180 0 9IN GS21 (SUTURE) ×2 IMPLANT
SYR 50ML LL SCALE MARK (SYRINGE) ×3 IMPLANT
SYRINGE 10CC LL (SYRINGE) ×3 IMPLANT
TIP RUMI ORANGE 6.7MMX12CM (TIP) ×1 IMPLANT
TIP UTERINE 5.1X6CM LAV DISP (MISCELLANEOUS) IMPLANT
TIP UTERINE 6.7X10CM GRN DISP (MISCELLANEOUS) IMPLANT
TIP UTERINE 6.7X6CM WHT DISP (MISCELLANEOUS) IMPLANT
TIP UTERINE 6.7X8CM BLUE DISP (MISCELLANEOUS) IMPLANT
TOWEL OR 17X24 6PK STRL BLUE (TOWEL DISPOSABLE) ×9 IMPLANT
TROCAR DISP BLADELESS 8 DVNC (TROCAR) IMPLANT
TROCAR DISP BLADELESS 8MM (TROCAR)
TROCAR PORT AIRSEAL 5X120 (TROCAR) ×1 IMPLANT
TROCAR PORT AIRSEAL 8X120 (TROCAR) ×1 IMPLANT
TROCAR XCEL 12X100 BLDLESS (ENDOMECHANICALS) ×3 IMPLANT
TROCAR XCEL NON-BLD 5MMX100MML (ENDOMECHANICALS) ×6 IMPLANT
TROCAR Z-THREAD 12X150 (TROCAR) ×3 IMPLANT
WARMER LAPAROSCOPE (MISCELLANEOUS) ×3 IMPLANT
WATER STERILE IRR 1000ML POUR (IV SOLUTION) ×9 IMPLANT

## 2015-01-15 NOTE — Anesthesia Procedure Notes (Signed)
Procedure Name: Intubation Date/Time: 01/15/2015 7:25 AM Performed by: Lyndle Herrlich Pre-anesthesia Checklist: Patient identified, Emergency Drugs available, Suction available, Patient being monitored and Timeout performed Patient Re-evaluated:Patient Re-evaluated prior to inductionOxygen Delivery Method: Circle system utilized Preoxygenation: Pre-oxygenation with 100% oxygen Intubation Type: Combination inhalational/ intravenous induction Ventilation: Mask ventilation without difficulty Laryngoscope Size: Glidescope and 3 Grade View: Grade I Tube type: Oral Tube size: 7.0 mm Number of attempts: 1 Airway Equipment and Method: Stylet Placement Confirmation: ETT inserted through vocal cords under direct vision,  positive ETCO2 and breath sounds checked- equal and bilateral Secured at: 22 cm Tube secured with: Tape Dental Injury: Teeth and Oropharynx as per pre-operative assessment

## 2015-01-15 NOTE — Anesthesia Postprocedure Evaluation (Signed)
Anesthesia Post Note  Patient: [facility]  Procedure(s) Performed: Procedure(s): ROBOTIC ASSISTED TOTAL HYSTERECTOMY (N/A) BILATERAL SALPINGECTOMY (Bilateral)  Anesthesia type: General  Patient location: Women's Unit  Post pain: Pain level controlled  Post assessment: Post-op Vital signs reviewed  Last Vitals: BP 128/53 mmHg  Pulse 72  Temp(Src) 36.2 C (Oral)  Resp 16  Ht 5\' 1"  (1.549 m)  Wt 187 lb (84.823 kg)  BMI 35.35 kg/m2  SpO2 100%  Post vital signs: Reviewed  Level of consciousness: awake  Complications: No apparent anesthesia complications

## 2015-01-15 NOTE — Addendum Note (Signed)
Addendum  created 01/15/15 1717 by Flossie Dibble, CRNA   Modules edited: Notes Section   Notes Section:  File: [id number]

## 2015-01-15 NOTE — Brief Op Note (Signed)
01/15/2015  11:08 AM  PATIENT:  [facility]  54 y.o. female  PRE-OPERATIVE DIAGNOSIS:  Symptomatic Uterine Fibroids, renovascular disease, cardiac disease  POST-OPERATIVE DIAGNOSIS:  Symptomatic Uterine Fibroids, renovascular disease, cardiac disease  PROCEDURE:  Procedure(s): ROBOTIC ASSISTED TOTAL HYSTERECTOMY (N/A) BILATERAL SALPINGECTOMY (Bilateral)  SURGEON:  Surgeon(s) and Role:    * Dennisse Swader Clint Bolder, MD - Primary  PHYSICIAN ASSISTANT:   ASSISTANTS: Artelia Laroche, CNM   ANESTHESIA:   general Findings: nl ovaries, small peri ovarian adhesions, fibroid uterus nl liver edge EBL:  Total I/O In: 1400 [I.V.:1400] Out: 500 [Urine:450; Blood:50]  BLOOD ADMINISTERED:none  DRAINS: none   LOCAL MEDICATIONS USED:  MARCAINE     SPECIMEN:  Source of Specimen:  uterus with cervix, tubes  DISPOSITION OF SPECIMEN:  PATHOLOGY  COUNTS:  YES  TOURNIQUET:  * No tourniquets in log *  DICTATION: .Other Dictation: Dictation Number [id number]  PLAN OF CARE: Admit for overnight observation  PATIENT DISPOSITION:  PACU - hemodynamically stable.   Delay start of Pharmacological VTE agent (>24hrs) due to surgical blood loss or risk of bleeding: no

## 2015-01-15 NOTE — Transfer of Care (Signed)
Immediate Anesthesia Transfer of Care Note  Patient: [facility]  Procedure(s) Performed: Procedure(s): ROBOTIC ASSISTED TOTAL HYSTERECTOMY (N/A) BILATERAL SALPINGECTOMY (Bilateral)  Patient Location: PACU  Anesthesia Type:General  Level of Consciousness: awake, alert  and oriented  Airway & Oxygen Therapy: Patient Spontanous Breathing and Patient connected to face mask oxygen  Post-op Assessment: Report given to PACU RN and Post -op Vital signs reviewed and stable  Post vital signs: Reviewed and stable  Last Vitals:  Filed Vitals:   01/15/15 0609  BP: 123/65  Pulse: 85  Temp: 36.7 C  Resp: 20    Complications: No apparent anesthesia complications

## 2015-01-15 NOTE — Anesthesia Postprocedure Evaluation (Signed)
  Anesthesia Post-op Note  Patient: [name]  Procedure(s) Performed: Procedure(s) (LRB): ROBOTIC ASSISTED TOTAL HYSTERECTOMY (N/A) BILATERAL SALPINGECTOMY (Bilateral)  Patient Location: PACU  Anesthesia Type: General  Level of Consciousness: awake and alert   Airway and Oxygen Therapy: Patient Spontanous Breathing  Post-op Pain: mild  Post-op Assessment: Post-op Vital signs reviewed, Patient's Cardiovascular Status Stable, Respiratory Function Stable, Patent Airway and No signs of Nausea or vomiting  Last Vitals:  Filed Vitals:   01/15/15 1145  BP: 127/47  Pulse: 63  Temp:   Resp: 12    Post-op Vital Signs: stable   Complications: No apparent anesthesia complications

## 2015-01-16 ENCOUNTER — Encounter (HOSPITAL_COMMUNITY): Payer: Self-pay | Admitting: Obstetrics and Gynecology

## 2015-01-16 DIAGNOSIS — D259 Leiomyoma of uterus, unspecified: Secondary | ICD-10-CM | POA: Diagnosis not present

## 2015-01-16 LAB — CBC
HEMATOCRIT: 31 % — AB (ref 36.0–46.0)
HEMOGLOBIN: 10.2 g/dL — AB (ref 12.0–15.0)
MCH: 29.1 pg (ref 26.0–34.0)
MCHC: 32.9 g/dL (ref 30.0–36.0)
MCV: 88.6 fL (ref 78.0–100.0)
Platelets: 317 10*3/uL (ref 150–400)
RBC: 3.5 MIL/uL — AB (ref 3.87–5.11)
RDW: 13.6 % (ref 11.5–15.5)
WBC: 17 10*3/uL — AB (ref 4.0–10.5)

## 2015-01-16 LAB — BASIC METABOLIC PANEL
Anion gap: 8 (ref 5–15)
BUN: 10 mg/dL (ref 6–23)
CALCIUM: 8.8 mg/dL (ref 8.4–10.5)
CHLORIDE: 100 mmol/L (ref 96–112)
CO2: 28 mmol/L (ref 19–32)
Creatinine, Ser: 1.34 mg/dL — ABNORMAL HIGH (ref 0.50–1.10)
GFR, EST AFRICAN AMERICAN: 51 mL/min — AB (ref >90)
GFR, EST NON AFRICAN AMERICAN: 44 mL/min — AB (ref >90)
GLUCOSE: 164 mg/dL — AB (ref 70–99)
Potassium: 3.3 mmol/L — ABNORMAL LOW (ref 3.5–5.1)
Sodium: 136 mmol/L (ref 135–145)

## 2015-01-16 MED ORDER — OXYCODONE-ACETAMINOPHEN 5-325 MG PO TABS: 1.0000 | ORAL_TABLET | ORAL | Status: DC | PRN

## 2015-01-16 MED ORDER — ASPIRIN 81 MG PO TBEC: 81.0000 mg | DELAYED_RELEASE_TABLET | Freq: Every day | ORAL | Status: DC

## 2015-01-16 NOTE — Progress Notes (Signed)
Subjective: Patient reports tolerating PO and no problems voiding.  Minimal pain med needs  Objective: I have reviewed patient's vital signs.  vital signs, intake and output and labs. Filed Vitals:   01/16/15 0518  BP: 99/49  Pulse: 76  Temp: 99.1 F (37.3 C)  Resp: 16   I/O last 3 completed shifts: In: 3034.8 [P.O.:220; I.V.:2639.8; Other:175] Out: 1300 [Urine:1150; Emesis/NG output:100; Blood:50]    Lab Results  Component Value Date   WBC 17.0* 01/16/2015   HGB 10.2* 01/16/2015   HCT 31.0* 01/16/2015   MCV 88.6 01/16/2015   PLT 317 01/16/2015   Lab Results  Component Value Date   CREATININE 1.34* 01/16/2015    EXAM General: alert, cooperative and no distress Resp: clear to auscultation bilaterally Cardio: regular rate and rhythm GI: soft, non-tender; bowel sounds normal; no masses,  no organomegaly and incision: clean, dry and intact Extremities: no edema, redness or tenderness in the calves or thighs Vaginal Bleeding: minimal  Assessment: s/p Procedure(s): ROBOTIC ASSISTED TOTAL HYSTERECTOMY BILATERAL SALPINGECTOMY: stable, progressing well, tolerating diet and anemia  Plan: Encourage ambulation Discontinue IV fluids Discharge home  D/c instructions D/c meds reviewed F/u 3 weeks  LOS: 1 day    Donnald Tabar A, MD 01/16/2015 7:44 AM    01/16/2015, 7:44 AM

## 2015-01-16 NOTE — Progress Notes (Signed)
Pt discharged home with significant other... Discharge instructions reviewed with pt and she verbalized understanding... Condition stable... No equipment... Ambulated to car with [name], NT.

## 2015-01-16 NOTE — Discharge Instructions (Signed)
Call if temperature greater than equal to 100.4, nothing per vagina for 4-6 weeks or severe nausea vomiting, increased incisional pain , drainage or redness in the incision site, no straining with bowel movements, showers no bath °

## 2015-01-16 NOTE — Discharge Summary (Signed)
Physician Discharge Summary  Patient ID: [name] MRN: [id number] DOB/AGE: August 21, 1961 54 y.o.  Admit date: 01/15/2015 Discharge date: 01/16/2015  Admission Diagnoses: symptomatic uterine fibroids, IDA, renovascular disease, cardiac disease  Discharge Diagnoses: same Active Problems:   S/P hysterectomy renovascular disease Cardiac disease  Discharged Condition: stable  Hospital Course: pt was admitted to [facility]. She underwent davinci robotic total hysterectomy, bilateral salpingectomy. Postop course unremarkable  Consults: None  Significant Diagnostic Studies: labs:  CBC Latest Ref Rng 01/16/2015 01/13/2015 04/17/2014  WBC 4.0 - 10.5 K/uL 17.0(H) 9.4 6.3  Hemoglobin 12.0 - 15.0 g/dL 10.2(L) 11.6(L) 11.6(L)  Hematocrit 36.0 - 46.0 % 31.0(L) 34.9(L) 34.8(L)  Platelets 150 - 400 K/uL 317 373 330     Treatments: surgery: davinci robotic total hysterectomy, bilateral salpingectomy  Discharge Exam: Blood pressure 99/49, pulse 76, temperature 99.1 F (37.3 C), temperature source Oral, resp. rate 16, height 5\' 1"  (1.549 m), weight 84.823 kg (187 lb), SpO2 98 %. General appearance: alert, cooperative and no distress Back: no tenderness to percussion or palpation Resp: clear to auscultation bilaterally GI: soft obese nontender (+) active BS Pelvic: deferred Extremities: no edema, redness or tenderness in the calves or thighs Incision/Wound:well approximated incision  Disposition: 01-Home or Self Care  Discharge Instructions    Diet - low sodium heart healthy    Complete by:  As directed      Discharge instructions    Complete by:  As directed   Call if temperature greater than equal to 100.4, nothing per vagina for 4-6 weeks or severe nausea vomiting, increased incisional pain , drainage or redness in the incision site, no straining with bowel movements, showers no bath     Discharge patient    Complete by:  As directed      Increase activity slowly    Complete by:  As  directed      May walk up steps    Complete by:  As directed      No wound care    Complete by:  As directed             Medication List    TAKE these medications        acetaminophen 325 MG tablet  Commonly known as:  TYLENOL  Take 325-650 mg by mouth every 6 (six) hours as needed for mild pain (depends on pain).     albuterol 108 (90 BASE) MCG/ACT inhaler  Commonly known as:  PROVENTIL HFA;VENTOLIN HFA  Inhale 2 puffs into the lungs every 4 (four) hours as needed for wheezing or shortness of breath.     aspirin 81 MG EC tablet  Take 1 tablet (81 mg total) by mouth daily.     carvedilol 12.5 MG tablet  Commonly known as:  COREG  TAKE 1 TABLET BY MOUTH TWICE DAILY WITH MEALS     ferrous sulfate 325 (65 FE) MG tablet  Take 325 mg by mouth daily with breakfast.     furosemide 40 MG tablet  Commonly known as:  LASIX  Take 1 tablet (40 mg total) by mouth 2 (two) times daily.     lisinopril 10 MG tablet  Commonly known as:  PRINIVIL,ZESTRIL  Take 1 tablet (10 mg total) by mouth 2 (two) times daily.     oxyCODONE-acetaminophen 5-325 MG per tablet  Commonly known as:  PERCOCET/ROXICET  Take 1-2 tablets by mouth every 4 (four) hours as needed for severe pain (moderate to severe pain (when tolerating fluids)).  rosuvastatin 40 MG tablet  Commonly known as:  CRESTOR  Take 1 tablet (40 mg total) by mouth daily.     sildenafil 20 MG tablet  Commonly known as:  REVATIO  Take 2 tablets (40 mg total) by mouth 3 (three) times daily.     spironolactone 25 MG tablet  Commonly known as:  ALDACTONE  Take 25 mg by mouth daily.     spironolactone 25 MG tablet  Commonly known as:  ALDACTONE  TAKE 1/2 TABLET BY MOUTH ONCE DAILY     VITAMIN D PO  Take 1 tablet by mouth daily.           Follow-up Information    Follow up with Conny Moening A, MD In 3 weeks.   Specialty:  Obstetrics and Gynecology   Contact information:   [address] [place]  [address] [phone]       Signed: Mauria Asquith A 01/16/2015, 7:50 AM

## 2015-01-17 NOTE — Op Note (Signed)
[name], [name]             ACCOUNT NO.:  [id number]  MEDICAL RECORD NO.:  [id number]  LOCATION:  9302                          FACILITY:  [facility]  PHYSICIAN:  Servando Salina, M.D.DATE OF BIRTH:  1961-10-27  DATE OF PROCEDURE:  01/15/2015 DATE OF DISCHARGE:  01/16/2015                              OPERATIVE REPORT   PREOPERATIVE DIAGNOSIS:  Symptomatic uterine fibroids.  POSTOPERATIVE DIAGNOSIS:  Symptomatic uterine fibroids.  PROCEDURE:  Da Vinci robotic total hysterectomy, bilateral salpingectomy.  ANESTHESIA:  General.  SURGEON:  Servando Salina, M.D.  ASSISTANT:  Artelia Laroche, CNM.  DESCRIPTION OF PROCEDURE:  Under adequate general anesthesia, the patient was positioned in the dorsal lithotomy position and prepared for robotic surgery.  Please see the anesthesia outline of their plan.  The patient had an arterial line placed.  The patient was sterilely prepped and draped in usual fashion.  Indwelling Foley catheter was sterilely placed.  A weighted speculum was placed in the vagina, a Sims retractor was placed anteriorly and 0 Vicryl figure-of-eight sutures placed in the anterior and posterior lip of the cervix.  The uterus sounded to 12 cm. A small RUMI cup with #12 uterine manipulator was introduced into the intrauterine cavity without incident and the balloon insufflated.  The weighted speculum was removed.  Attention was then turned to the abdomen.  Supraumbilical incision was made after 0.25% Marcaine injected.  Veress needle was introduced,tested with normal saline, 2.4 L of CO2 was insufflated and was then removed.    A 12 mm disposable trocar with sleeve was introduced into the abdomen without incident.  The robotic camera was then placed through that port and the pelvis was inspected.  Atraumatic entry into the abdomen was noted.  Normal liver edge was noted.  The patient was placed in Trendelenburg position.  Uterine manipulation was then done. Uterus  was irregular in contoured due to fibroids.  Surgical separation of both fallopian tubes noted and both ovaries were normal.  Robotic port sites were placed, 2 on the left, 10 cm apart, and a right 8 mm robotic port site was placed in the right upper abdomen, and 8 mm AirSeal assistant port was placed.  The robot was then docked to the patient's left.  In arm #1, monopolar scissor was placed, arm #2, PK dissector placed; and arm #3, the ProGrasp was placed.  I then went to the surgical console. At the surgical console, the pelvis was inspected.  No endometriosis was noted in the anterior or posterior cul-de-sac.  Some adhesions between the left fallopian tube and the left utero-ovarian ligament was noted, these were lysed.  The procedure was started on the left.  Fallopian tube was grasped with the third arm.  The underlying mesosalpinx was serially clamped, cauterized, and then cut.  The tube was then severed from its uterine attachment and removed through the assistant port.  The retroperitoneal space on the left was opened.  The ureter initially was not seen.  A dissection on the retroperitoneal side was done.  A window in the retroperitoneal space was then made and the utero-ovarian ligament on the left was serially clamped, cauterized, and then cut. The round ligaments on the left was  then serially clamped, cauterized, and cut.  The posterior and anterior leaf of the broad ligament was then opened and extended.  The vesicouterine and peritoneum was opened anteriorly and displaced inferiorly.  Uterine vessels were noted, then partially skeletonized and cauterized proximally, but not cut.  On the contralateral side, again the fallopian tube was grasped.  The ureter was easily seen on the right.  The ureter was ultimately seen on the Left peristalsing and deep in the pelvis.  Mesosalpinx was serially clamped, cauterized, and cut on the right.  The weighted speculum space was opened on the  right.  The right utero-ovarian ligament was serially clamped, cauterized, and then cut.  The round ligament was serially clamped, cauterized, and then cut, and the remaining portion of the vesicouterine peritoneum was then opened and the bladder displaced further with sharp dissection.  The uterine vessels were noted, they were skeletonized, clamped, cauterized, and then cut.  The vaginal insufflator was then done.  The anterior junction between the RUMI cup and the vaginal portion of the uterus was then opened anteriorly and then opened posteriorly.  The uterine vessels were then clamped, cauterized, and then serially cut detaching the uterus from its attachment on the left side and then detached the uterus on the right with serially clamping and cutting and cauterizing the uterine vessels. Once this was done, the uterus was slightly enlarged to be removed through the vagina.  It was partially morcellated using the scissors, and once the uterus was removed, the vaginal insufflator was reinserted. The vaginal cuff was inspected.  There was a extension anteriorly.  The bladder was then displaced further inferiorly.  The arm #1 and arm #2 instruments were then replaced by Mega suture needle driver and long-tipped Forceps respectively.  Using a 0 Vicryl suture, the extension was closed with running lock stitch.  The 0 V-Loc suture was then placed with closure of the vaginal cuff in a running fashion. Palpation of the vaginal cuff by the assistant showed good approximation. Pelvis was irrigated and suctioned. Good hemostasis was noted.  The pedicles were inspected and were well approximated and hemostased.  Ureter was noted to be peristalsing bilaterally.  At that point, the procedure was felt to be complete.  The robotic instruments were removed.  The robot was then undocked.  I went back sterilely to the patient's bed side.  The pelvis was inspected. Small bleeders were minimally noted.  The  robotic port sites were then removed.  The AirSeal was to be removed last per its instructions. The abdomen was then deflated.  The vaginal cuff was digitally inspected on closure prior to undocking, and it was noted to be well-approximated. The incisions were then closed with 4-0 Vicryl for the skin subcuticularly, 0 Vicryl figure-of-eight at supraumbilical site for the fascia.  The vagina was once again digitally inspected.  The vaginal cuff was well-approximated.  SPECIMEN: uterus with cervix and fallopian tubes sent to pathology.  ESTIMATED BLOOD LOSS:  50 mL.  INTRAOPERATIVE FLUID:  1200 mL.  URINE OUTPUT:  450 mL.  COUNTS:  Sponge and instrument counts x2 was correct.  COMPLICATIONS:  None.  The patient tolerated the procedure very well, and was transferred to recovery room in stable condition.     Servando Salina, M.D.     [place]/MEDQ  D:  01/15/2015  T:  01/16/2015  Job:  [id number]

## 2015-01-21 ENCOUNTER — Other Ambulatory Visit (HOSPITAL_COMMUNITY): Payer: Self-pay | Admitting: Anesthesiology

## 2015-02-26 ENCOUNTER — Telehealth (HOSPITAL_COMMUNITY): Payer: Self-pay

## 2015-02-26 NOTE — Telephone Encounter (Signed)
Spoke with Lelon Frohlich nurse with [facility] about patient who states patient weight is up 8 lbs overnight.  No swelling, edema, or SOB noted, patient "feels fine", however BP lower than usual, 89/53.  Per telemonitoring system, has been running lower past couple of days.  Ann to fax over trends of weight and BPs for [place] to review.  Renee Pain

## 2015-03-31 ENCOUNTER — Other Ambulatory Visit (HOSPITAL_COMMUNITY): Payer: Self-pay | Admitting: Internal Medicine

## 2015-06-23 ENCOUNTER — Telehealth (HOSPITAL_COMMUNITY): Payer: Self-pay | Admitting: Vascular Surgery

## 2015-07-15 ENCOUNTER — Other Ambulatory Visit (HOSPITAL_COMMUNITY): Payer: Self-pay | Admitting: Internal Medicine

## 2015-07-15 DIAGNOSIS — I5022 Chronic systolic (congestive) heart failure: Secondary | ICD-10-CM

## 2015-07-15 NOTE — Telephone Encounter (Signed)
bensimhon refill. Thank you for your time. 

## 2015-07-22 ENCOUNTER — Ambulatory Visit (HOSPITAL_COMMUNITY)
Admission: RE | Admit: 2015-07-22 | Discharge: 2015-07-22 | Disposition: A | Discharge status: Home / Self Care | Payer: Medicaid Other | Source: Ambulatory Visit | Attending: Internal Medicine | Admitting: Internal Medicine

## 2015-07-22 VITALS — BP 128/62 | HR 73 | Wt 184.0 lb

## 2015-07-22 DIAGNOSIS — I270 Primary pulmonary hypertension: Secondary | ICD-10-CM | POA: Diagnosis not present

## 2015-07-22 DIAGNOSIS — J45909 Unspecified asthma, uncomplicated: Secondary | ICD-10-CM | POA: Diagnosis not present

## 2015-07-22 DIAGNOSIS — E780 Pure hypercholesterolemia: Secondary | ICD-10-CM | POA: Diagnosis not present

## 2015-07-22 DIAGNOSIS — I2510 Atherosclerotic heart disease of native coronary artery without angina pectoris: Secondary | ICD-10-CM | POA: Diagnosis not present

## 2015-07-22 DIAGNOSIS — I429 Cardiomyopathy, unspecified: Secondary | ICD-10-CM | POA: Insufficient documentation

## 2015-07-22 DIAGNOSIS — I10 Essential (primary) hypertension: Secondary | ICD-10-CM | POA: Diagnosis not present

## 2015-07-22 DIAGNOSIS — Z7982 Long term (current) use of aspirin: Secondary | ICD-10-CM | POA: Diagnosis not present

## 2015-07-22 DIAGNOSIS — E049 Nontoxic goiter, unspecified: Secondary | ICD-10-CM | POA: Insufficient documentation

## 2015-07-22 DIAGNOSIS — I5022 Chronic systolic (congestive) heart failure: Secondary | ICD-10-CM | POA: Insufficient documentation

## 2015-07-22 DIAGNOSIS — Z79899 Other long term (current) drug therapy: Secondary | ICD-10-CM | POA: Insufficient documentation

## 2015-07-22 DIAGNOSIS — I272 Pulmonary hypertension, unspecified: Secondary | ICD-10-CM

## 2015-07-22 LAB — LIPID PANEL
CHOL/HDL RATIO: 6.7 ratio
CHOLESTEROL: 255 mg/dL — AB (ref 0–200)
HDL: 38 mg/dL — AB (ref >40)
LDL Cholesterol: 180 mg/dL — ABNORMAL HIGH (ref 0–99)
Triglycerides: 184 mg/dL — ABNORMAL HIGH (ref <150)
VLDL: 37 mg/dL (ref 0–40)

## 2015-07-22 LAB — COMPREHENSIVE METABOLIC PANEL
ALK PHOS: 98 U/L (ref 38–126)
ALT: 17 U/L (ref 14–54)
AST: 24 U/L (ref 15–41)
Albumin: 3.8 g/dL (ref 3.5–5.0)
Anion gap: 13 (ref 5–15)
BUN: 9 mg/dL (ref 6–20)
CALCIUM: 9.6 mg/dL (ref 8.9–10.3)
CO2: 30 mmol/L (ref 22–32)
Chloride: 97 mmol/L — ABNORMAL LOW (ref 101–111)
Creatinine, Ser: 1.46 mg/dL — ABNORMAL HIGH (ref 0.44–1.00)
GFR calc non Af Amer: 39 mL/min — ABNORMAL LOW (ref >60)
GFR, EST AFRICAN AMERICAN: 46 mL/min — AB (ref >60)
Glucose, Bld: 152 mg/dL — ABNORMAL HIGH (ref 65–99)
POTASSIUM: 3 mmol/L — AB (ref 3.5–5.1)
SODIUM: 140 mmol/L (ref 135–145)
Total Bilirubin: 0.8 mg/dL (ref 0.3–1.2)
Total Protein: 7.8 g/dL (ref 6.5–8.1)

## 2015-07-22 LAB — BRAIN NATRIURETIC PEPTIDE: B NATRIURETIC PEPTIDE 5: 18.6 pg/mL (ref 0.0–100.0)

## 2015-07-22 NOTE — Progress Notes (Signed)
Patient ID: [facility], female   DOB: 1961/12/14, 54 y.o.   MRN: [id number] PCP: Dr. Vista Lawman  HPI:  Ms Seats is a 54 yo with a history of nonischemic cardiomyopathy, HTN, and chronic systolic heart failure, anemia, PAH on slidenafil and asthma.   Coronary angiography in 2005 with no CAD and RHC at that time showed PA systolic pressure in mid [id number]   CT of chest 03/18/13 no PE but prominent interstitial pattern. VQ 07/02/13 Normal PFTs done 7/14 - FVC 2.1, FEV1 1.5 FEV1/FVC 72, DLCO 12.01 (restrictive picture).  ANA, HIV, ANCA, anti-GBM Ab and RF negative  Admitted to [facility] 04/17/13 through 04/19/13 with increased dyspnea. Diuresed with IV lasix. D/C weight 160. She was re-evaluated in [facility] ED on 05/31/13. Had been off all HF medications. She was restarted on carvedilol 3.125 mg bid lasix 20 mg daily, lisinopril 10 mg daily and referred to Dr. [name] Cancel for further evaluation. He performed outpatient RHC and referred her to HF Clinic for further evaluation of PH. .  06/13/13 RHC  Mean RA: 20 mmHg  RV: 76/12 mmHg  PA: 75/36 mmHg with a mean of 49 mmHg  PCWP: 27 mmHg  Ao sat : 94% PA sat 50%  Fick Cardiac Output: 3.1 L/minute, 1.8 L/minute/M2  PVR: 8.0 wood units 567 dysnes-sec cm5   07/05/13 R/LHC RA mean 7  RV 44/11  PA 57/27, mean 40  PA (50 mcg/kg/min adenosine) 56/27, mean 41  PA (100 mcg/kg/min adenosine) 52/24, mean 37  PA (150 mcg/kg/min adenosine) 52/25, mean 38  Unable to increase to 200 mcg/kg/min due to pump problems  PCWP mean 16  LV 127/21  AO 125/73  Oxygen saturations:  PA 69%  AO 93%  Fick CO/CI: 4.21/2.61  Thermo CO/CI: 3.53/ 2.19  PVR (Fick): 5.7 WU  PVR (Thermo): 6.8 WU .  Final Conclusions: Diffuse moderate CAD. Most significant lesion is ostial RCA stenosis. Reviewed with Dr. Angelena Form. 70-80% stenosis with ostial damping in setting of relatively small caliber RCA. The RCA is diffusely disease. In the absence of ACS (no chest pain), would treat medically. Other  coronary distributions have diffuse moderate disease.   08/29/13 - 6 MW: 408 feet/124 meters 03/18/14 6MW: 750 feet/228 meters  06/17/14: 6MW: 940 ft/286 meters 6 minute walk (10/28/14): 408 meters 6 minute walk (07/22/15): 341 meters  Labs (07/29/13 ): K 4, creatinine 0.79 Labs (9/11/ 14 ): K 4.5 Creatinine 1.2  Labs (09/03/13) : Cholesterol 198 Triglycerides 93 HDL 47 Labs 09/30/13: K 5.4 Creatinine 1.14  Labs 10/04/13: K 3.5 Creatinine 1.2 Labs 6/15: BNP 57 Labs 7/15: K 4.3, creatinine 0.95 Labs 11/15: LDL 161, BNP 50 Labs 1/16: K 3.3, creatinine 1.34, HCT 31  Echo 09/2013 EF 45-50%. RV normal. No evidence PAH by echo Echo 11/15 EF 50%, diffuse hypokinesis, normal RV size and systolic function, unable to estimate PA systolic pressure.   Had sleep study 11/05/13--> no evidence of sleep apnea.   Follow up for Heart Failure: She continues to do well symptomatically.  No exertional dyspnea, no orthopnea/PND, no chest pain.  No lightheadedness or palpitations. She had a hysterectomy in [id number] without complications.   SH: single, lives in [place], nonsmoker. Disabled.  FH:  No premature CAD.   ROS: All systems negative except as listed in HPI, PMH and Problem List.   Past Medical History  Diagnosis Date  . Hypertension   . Hypercholesterolemia   . Chronic systolic CHF (congestive heart failure)   . Nonischemic cardiomyopathy  a. 2005 Cath: nonobstructive dzs;  b. 04/2013 Echo: EF 3035%, diff HK, restrictive physiology, mildly dil LA, PASP 2mmHg.  . Microcytic anemia   . SVD (spontaneous vaginal delivery)     x 3  . Asthma     prn - occasional uses inhaler  . Diabetes mellitus without complication     borderline, being monitored, no meds    Current Outpatient Prescriptions on File Prior to Encounter  Medication Sig Dispense Refill  . acetaminophen (TYLENOL) 325 MG tablet Take 325-650 mg by mouth every 6 (six) hours as needed for mild pain (depends on pain).    Marland Kitchen albuterol  (PROVENTIL HFA;VENTOLIN HFA) 108 (90 BASE) MCG/ACT inhaler Inhale 2 puffs into the lungs every 4 (four) hours as needed for wheezing or shortness of breath.    Marland Kitchen aspirin 81 MG EC tablet Take 1 tablet (81 mg total) by mouth daily. 30 tablet 12  . carvedilol (COREG) 12.5 MG tablet TAKE 1 TABLET BY MOUTH TWICE DAILY WITH MEALS 60 tablet PRN  . Cholecalciferol (VITAMIN D PO) Take 1 tablet by mouth daily.    . ferrous sulfate 325 (65 FE) MG tablet Take 325 mg by mouth daily with breakfast.    . furosemide (LASIX) 40 MG tablet TAKE 1 TABLET (40 MG TOTAL) BY MOUTH 2 (TWO) TIMES DAILY. 60 tablet 6  . lisinopril (PRINIVIL,ZESTRIL) 10 MG tablet TAKE 1 TABLET (10 MG TOTAL) BY MOUTH 2 (TWO) TIMES DAILY. 60 tablet 6  . oxyCODONE-acetaminophen (PERCOCET/ROXICET) 5-325 MG per tablet Take 1-2 tablets by mouth every 4 (four) hours as needed for severe pain (moderate to severe pain (when tolerating fluids)). 30 tablet 0  . rosuvastatin (CRESTOR) 40 MG tablet Take 1 tablet (40 mg total) by mouth daily. (Patient taking differently: Take 40 mg by mouth at bedtime. ) 30 tablet 2  . sildenafil (REVATIO) 20 MG tablet Take 2 tablets (40 mg total) by mouth 3 (three) times daily. 180 tablet 3  . spironolactone (ALDACTONE) 25 MG tablet TAKE 1/2 TABLET BY MOUTH ONCE DAILY 15 tablet 0   No current facility-administered medications on file prior to encounter.    Filed Vitals:   07/22/15 0915  BP: 128/62  Pulse: 73  Weight: 184 lb (83.462 kg)  SpO2: 97%   PHYSICAL EXAM: General: Well appearing. No resp difficulty  HEENT: normal  Neck: supple. JVP flat. Carotids 2+ bilaterally; no bruits. No lymphadenopathy.  Thyroid appears diffusely enlarged.   Cor: PMI normal. Regular rate & rhythm. 1/6 early SEM RUSB.  Lungs: clear  Abdomen: obese, soft, nontender, nondistended. No hepatosplenomegaly. No bruits or masses. Good bowel sounds.  Extremities: no cyanosis, clubbing, rash, no edema Neuro: alert & orientedx3, cranial  nerves grossly intact. Moves all 4 extremities w/o difficulty. Affect pleasant.   ASSESSMENT & PLAN:   1. Chronic systolic HF: Nonischemic cardiomyopathy (CMP out of proportion to CAD).  EF 50% with RV normal and unable to estimate PA systolic pressure (no TR jet) on 11/15 echo. NYHA I symptoms and volume status stable.   - Continue current Lasix, lisinopril, Coreg, and spironolactone.  - BMET/BNP today.  - Reinforced the need and importance of daily weights, a low sodium diet, and fluid restriction (less than 2 L a day). Instructed to call the HF clinic if weight increases more than 3 lbs overnight or 5 lbs in a week.  2. PAH: Suspect combination of group 1 and group 2 pulmonary hypertension. PVR 5.7 WU on RHC in 7/14, not reactive to  adenosine. V/Q scan normal.  PFTs with restrictive picture and some concern for possible interstitial lung disease on prior chest CT.  Evaluated by pulmonary. Had sleep study in November 2014 with no evidence of sleep apnea. Echo in 11/15 showed normal RV, unable to estimate PA systolic pressure.  6 minute walk today was good (341 m) but not as far as at last appointment.  - Check BNP today.  - Continue current sildenafil - Repeat echo in 11/16 to follow RV size/function and noninvasive measure of PA pressure.  3. CAD: Ostial 70-80% RCA lesion (07/05/2013).  No chest pain.  Cardiomyopathy has been out of proportion to CAD. Continue medical treatment with ASA 81 and statin. Will check lipids today, she is taking her Crestor (was off Crestor when last lipids drawn).  4. HTN: BP controlled.  5. Thyromegaly: Check TSH.   Loralie Champagne 07/22/2015

## 2015-07-22 NOTE — Progress Notes (Signed)
6 min walk test completed, pt ambulated 1120 ft (341.35m), HR ranged 98-112, O2 sats ranged from 89-98%

## 2015-07-22 NOTE — Patient Instructions (Signed)
Labs today  Your physician has requested that you have an echocardiogram. Echocardiography is a painless test that uses sound waves to create images of your heart. It provides your doctor with information about the size and shape of your heart and how well your heart's chambers and valves are working. This procedure takes approximately one hour. There are no restrictions for this procedure.  Your physician recommends that you schedule a follow-up appointment in: 3 months with a echocardiogram  Do the following things EVERYDAY: 1) Weigh yourself in the morning before breakfast. Write it down and keep it in a log. 2) Take your medicines as prescribed 3) Eat low salt foods-Limit salt (sodium) to 2000 mg per day.  4) Stay as active as you can everyday 5) Limit all fluids for the day to less than 2 liters 6)

## 2015-07-27 ENCOUNTER — Other Ambulatory Visit (HOSPITAL_COMMUNITY): Payer: Self-pay | Admitting: *Deleted

## 2015-07-28 ENCOUNTER — Telehealth (HOSPITAL_COMMUNITY): Payer: Self-pay | Admitting: *Deleted

## 2015-07-28 MED ORDER — POTASSIUM CHLORIDE CRYS ER 20 MEQ PO TBCR: 40.0000 meq | EXTENDED_RELEASE_TABLET | Freq: Every day | ORAL | Status: DC

## 2015-07-28 NOTE — Telephone Encounter (Signed)
Notes Recorded by Scarlette Calico, RN on 07/28/2015 at 11:20 AM Pt aware and agreeable, rx for KCL sent in, pt states she has been taking Crestor 40 mg daily, will refer to lipid clinic

## 2015-07-28 NOTE — Telephone Encounter (Signed)
-----   Message from Larey Dresser, MD sent at 07/24/2015  4:00 PM EDT ----- Add KCl 40 daily.  LDL very high.  Is she really taking Crestor 40 daily? If not, needs to start.  If she is, needs appt with lipids clinic at [facility]

## 2015-07-31 NOTE — Telephone Encounter (Signed)
Encounter complete. 

## 2015-08-03 ENCOUNTER — Ambulatory Visit (HOSPITAL_COMMUNITY): Payer: Medicaid Other | Attending: Cardiology

## 2015-08-03 ENCOUNTER — Other Ambulatory Visit: Payer: Self-pay

## 2015-08-03 DIAGNOSIS — I5189 Other ill-defined heart diseases: Secondary | ICD-10-CM | POA: Diagnosis not present

## 2015-08-03 DIAGNOSIS — E119 Type 2 diabetes mellitus without complications: Secondary | ICD-10-CM | POA: Diagnosis not present

## 2015-08-03 DIAGNOSIS — I10 Essential (primary) hypertension: Secondary | ICD-10-CM | POA: Diagnosis not present

## 2015-08-03 DIAGNOSIS — I5022 Chronic systolic (congestive) heart failure: Secondary | ICD-10-CM

## 2015-08-03 DIAGNOSIS — E785 Hyperlipidemia, unspecified: Secondary | ICD-10-CM | POA: Diagnosis not present

## 2015-08-03 DIAGNOSIS — Z8249 Family history of ischemic heart disease and other diseases of the circulatory system: Secondary | ICD-10-CM | POA: Diagnosis not present

## 2015-08-03 DIAGNOSIS — I509 Heart failure, unspecified: Secondary | ICD-10-CM | POA: Diagnosis present

## 2015-08-20 ENCOUNTER — Telehealth (HOSPITAL_COMMUNITY): Payer: Self-pay | Admitting: *Deleted

## 2015-08-20 NOTE — Telephone Encounter (Signed)
Webb Silversmith, RN called concerned about pt's BP she states it has been running low and pt has been feeling bad, BP has been low 100s recently and today was 97/48, upon review of pt's meds w/Anne we discovered pt has been taking Arlyce Harman 25 mg instead of 12.5 mg, she will have pt decrease dose to 12.5 mg as ordered per her chart and see if BP and symptoms of fatigue improve, if not she will call us back

## 2015-09-18 ENCOUNTER — Other Ambulatory Visit (HOSPITAL_COMMUNITY): Payer: Self-pay | Admitting: Adult Health

## 2015-09-18 ENCOUNTER — Other Ambulatory Visit (HOSPITAL_COMMUNITY): Payer: Self-pay | Admitting: Internal Medicine

## 2015-10-13 ENCOUNTER — Telehealth (HOSPITAL_COMMUNITY): Payer: Self-pay

## 2015-10-13 NOTE — Telephone Encounter (Signed)
Ann from [employer] called to see if we had a form filled out that she sent.  No forms founds in either MD's folder or in EPIC.  She will be Faxing a new form

## 2015-10-22 NOTE — Telephone Encounter (Signed)
Waiting got MD signature

## 2015-11-11 ENCOUNTER — Telehealth (HOSPITAL_COMMUNITY): Payer: Self-pay

## 2015-11-11 DIAGNOSIS — I5020 Unspecified systolic (congestive) heart failure: Secondary | ICD-10-CM

## 2015-11-11 NOTE — Telephone Encounter (Signed)
Needs echo and followup ASAP.  Go to ER with further syncope.

## 2015-11-11 NOTE — Telephone Encounter (Signed)
Routed to [facility] to schedule appointment for echo and OV.  Echo order in system

## 2015-11-11 NOTE — Telephone Encounter (Signed)
Ms. Gena Fray [phone]) from [facility] called and said patient has been complaining for dizziness and passing out intermittently for the past two months and has not mentioned it to anyone Patient called, said what she did was trying t eat her eating habits to keep her blood sugar up.  Buy waiting too long before eating has felt dizziness and at times  Has not had a glucose meter for the past couple days but is going to be getting a new one. Instructed her that if she feels like she is going to pass out or passes out she needs to go to the ED On recall list for follow up appointment  Needs to have echo scheduled with follow up appointment  Echo ordered Sending to [facility] to set up appointment for echo and F/U apppointment

## 2015-12-15 ENCOUNTER — Ambulatory Visit (HOSPITAL_COMMUNITY)
Admission: RE | Admit: 2015-12-15 | Discharge: 2015-12-15 | Disposition: A | Discharge status: Home / Self Care | Payer: Medicare Other | Source: Ambulatory Visit | Attending: Internal Medicine | Admitting: Internal Medicine

## 2015-12-15 ENCOUNTER — Encounter (HOSPITAL_COMMUNITY): Payer: Self-pay

## 2015-12-15 ENCOUNTER — Other Ambulatory Visit (HOSPITAL_COMMUNITY): Payer: Self-pay | Admitting: Internal Medicine

## 2015-12-15 ENCOUNTER — Ambulatory Visit (HOSPITAL_BASED_OUTPATIENT_CLINIC_OR_DEPARTMENT_OTHER)
Admission: RE | Admit: 2015-12-15 | Discharge: 2015-12-15 | Disposition: A | Discharge status: Home / Self Care | Payer: Medicare Other | Source: Ambulatory Visit | Attending: Internal Medicine | Admitting: Internal Medicine

## 2015-12-15 VITALS — BP 122/58 | HR 69 | Wt 182.0 lb

## 2015-12-15 DIAGNOSIS — I10 Essential (primary) hypertension: Secondary | ICD-10-CM | POA: Insufficient documentation

## 2015-12-15 DIAGNOSIS — I5023 Acute on chronic systolic (congestive) heart failure: Secondary | ICD-10-CM | POA: Diagnosis present

## 2015-12-15 DIAGNOSIS — I272 Other secondary pulmonary hypertension: Secondary | ICD-10-CM | POA: Diagnosis not present

## 2015-12-15 DIAGNOSIS — I517 Cardiomegaly: Secondary | ICD-10-CM | POA: Insufficient documentation

## 2015-12-15 DIAGNOSIS — E7800 Pure hypercholesterolemia, unspecified: Secondary | ICD-10-CM | POA: Diagnosis not present

## 2015-12-15 DIAGNOSIS — E119 Type 2 diabetes mellitus without complications: Secondary | ICD-10-CM | POA: Diagnosis not present

## 2015-12-15 DIAGNOSIS — I5022 Chronic systolic (congestive) heart failure: Secondary | ICD-10-CM | POA: Diagnosis not present

## 2015-12-15 DIAGNOSIS — I2510 Atherosclerotic heart disease of native coronary artery without angina pectoris: Secondary | ICD-10-CM

## 2015-12-15 LAB — BASIC METABOLIC PANEL
ANION GAP: 11 (ref 5–15)
BUN: 7 mg/dL (ref 6–20)
CALCIUM: 9.2 mg/dL (ref 8.9–10.3)
CO2: 26 mmol/L (ref 22–32)
CREATININE: 1.17 mg/dL — AB (ref 0.44–1.00)
Chloride: 104 mmol/L (ref 101–111)
GFR calc Af Amer: 59 mL/min — ABNORMAL LOW (ref >60)
GFR calc non Af Amer: 51 mL/min — ABNORMAL LOW (ref >60)
Glucose, Bld: 112 mg/dL — ABNORMAL HIGH (ref 65–99)
Potassium: 3.8 mmol/L (ref 3.5–5.1)
Sodium: 141 mmol/L (ref 135–145)

## 2015-12-15 NOTE — Progress Notes (Signed)
Echocardiogram 2D Echocardiogram limited has been performed.  [name] 12/15/2015, 10:54 AM

## 2015-12-15 NOTE — Addendum Note (Signed)
Encounter addended by: Scarlette Calico, RN on: 12/15/2015 11:16 AM<BR>     Documentation filed: Dx Association, Patient Instructions Section, Orders

## 2015-12-15 NOTE — Progress Notes (Signed)
ADVANCED HF CLINIC NOTE  Patient ID: [name], female   DOB: 1961/09/18, 54 y.o.   MRN: [id number] PCP: Dr. Vista Lawman  HPI:  [name] is a 54  yo with a history of nonischemic cardiomyopathy, HTN, and chronic systolic heart failure, anemia, PAH on slidenafil and asthma.   Coronary angiography in 2005 with no CAD and RHC at that time showed PA systolic pressure in mid [id number]   CT of chest 03/18/13 no PE but prominent interstitial pattern. VQ 07/02/13 Normal PFTs done 7/14 - FVC 2.1, FEV1 1.5 FEV1/FVC 72, DLCO 12.01 (restrictive picture).  ANA, HIV, ANCA, anti-GBM Ab and RF negative  Admitted to [facility] 04/17/13 through 04/19/13 with increased dyspnea. Diuresed with IV lasix. D/C weight 160. She was re-evaluated in [facility] ED on 05/31/13. Had been off all HF medications. She was restarted on carvedilol 3.125 mg bid lasix 20 mg daily, lisinopril 10 mg daily and referred to Dr. [name] Cancel for further evaluation. He performed outpatient RHC and referred her to HF Clinic for further evaluation of PH.   Follow up for Heart Failure: She continues to do very well. Taking all meds as prescribed. No exertional dyspnea, no orthopnea/PND/edema, no chest pain. No snoring.   I reviewed echo personally today in clinic. EF 60% RV normal. No pulmonary HTN   Studies: .  06/13/13 RHC  Mean RA: 20 mmHg  RV: 76/12 mmHg  PA: 75/36 mmHg with a mean of 49 mmHg  PCWP: 27 mmHg  Ao sat : 94% PA sat 50%  Fick Cardiac Output: 3.1 L/minute, 1.8 L/minute/M2  PVR: 8.0 wood units 567 dysnes-sec cm5   07/05/13 R/LHC RA mean 7  RV 44/11  PA 57/27, mean 40  PA (50 mcg/kg/min adenosine) 56/27, mean 41  PA (100 mcg/kg/min adenosine) 52/24, mean 37  PA (150 mcg/kg/min adenosine) 52/25, mean 38  Unable to increase to 200 mcg/kg/min due to pump problems  PCWP mean 16  LV 127/21  AO 125/73  Oxygen saturations:  PA 69%  AO 93%  Fick CO/CI: 4.21/2.61  Thermo CO/CI: 3.53/ 2.19  PVR (Fick): 5.7 WU  PVR (Thermo): 6.8 WU .    Final Conclusions: Diffuse moderate CAD. Most significant lesion is ostial RCA stenosis. Reviewed with Dr. Angelena Form. 70-80% stenosis with ostial damping in setting of relatively small caliber RCA. The RCA is diffusely disease. In the absence of ACS (no chest pain), would treat medically. Other coronary distributions have diffuse moderate disease.   08/29/13 - 6 MW: 408 feet/124 meters 03/18/14 6MW: 750 feet/228 meters  06/17/14: 6MW: 940 ft/286 meters 6 minute walk (10/28/14): 408 meters 6 minute walk (07/22/15): 341 meters  Labs (07/29/13 ): K 4, creatinine 0.79 Labs (9/11/ 14 ): K 4.5 Creatinine 1.2  Labs (09/03/13) : Cholesterol 198 Triglycerides 93 HDL 47 Labs 09/30/13: K 5.4 Creatinine 1.14  Labs 10/04/13: K 3.5 Creatinine 1.2 Labs 6/15: BNP 57 Labs 7/15: K 4.3, creatinine 0.95 Labs 11/15: LDL 161, BNP 50 Labs 1/16: K 3.3, creatinine 1.34, HCT 31  Echo 09/2013 EF 45-50%. RV normal. No evidence PAH by echo Echo 11/15 EF 50%, diffuse hypokinesis, normal RV size and systolic function, unable to estimate PA systolic pressure.  Echo 12/15/15 EF 60%, normal RV size and systolic function, trivial TR normal diastolic function   Had sleep study 11/05/13--> no evidence of sleep apnea.   SH: single, lives in [place], nonsmoker. Disabled.  FH:  No premature CAD.   ROS: All systems negative except as listed in HPI,  PMH and Problem List.   Past Medical History  Diagnosis Date  . Hypertension   . Hypercholesterolemia   . Chronic systolic CHF (congestive heart failure) ([place])   . Nonischemic cardiomyopathy ([place])     a. 2005 Cath: nonobstructive dzs;  b. 04/2013 Echo: EF 3035%, diff HK, restrictive physiology, mildly dil LA, PASP 30mmHg.  . Microcytic anemia   . SVD (spontaneous vaginal delivery)     x 3  . Asthma     prn - occasional uses inhaler  . Diabetes mellitus without complication ([place])     borderline, being monitored, no meds    Current Outpatient Prescriptions on File Prior to  Encounter  Medication Sig Dispense Refill  . acetaminophen (TYLENOL) 325 MG tablet Take 325-650 mg by mouth every 6 (six) hours as needed for mild pain (depends on pain).    Marland Kitchen albuterol (PROVENTIL HFA;VENTOLIN HFA) 108 (90 BASE) MCG/ACT inhaler Inhale 2 puffs into the lungs every 4 (four) hours as needed for wheezing or shortness of breath.    Marland Kitchen aspirin 81 MG EC tablet Take 1 tablet (81 mg total) by mouth daily. 30 tablet 12  . carvedilol (COREG) 12.5 MG tablet TAKE 1 TABLET BY MOUTH TWICE DAILY WITH MEALS 60 tablet PRN  . Cholecalciferol (VITAMIN D PO) Take 1 tablet by mouth daily.    . ferrous sulfate 325 (65 FE) MG tablet Take 325 mg by mouth daily with breakfast.    . furosemide (LASIX) 40 MG tablet TAKE 1 TABLET BY MOUTH 2 TIMES DAILY. 60 tablet 6  . lisinopril (PRINIVIL,ZESTRIL) 10 MG tablet TAKE 1 TABLET BY MOUTH 2 TIMES DAILY. 60 tablet 6  . oxyCODONE-acetaminophen (PERCOCET/ROXICET) 5-325 MG per tablet Take 1-2 tablets by mouth every 4 (four) hours as needed for severe pain (moderate to severe pain (when tolerating fluids)). 30 tablet 0  . potassium chloride SA (K-DUR,KLOR-CON) 20 MEQ tablet Take 2 tablets (40 mEq total) by mouth daily. 60 tablet 3  . sildenafil (REVATIO) 20 MG tablet Take 2 tablets (40 mg total) by mouth 3 (three) times daily. 180 tablet 3  . spironolactone (ALDACTONE) 25 MG tablet TAKE 1/2 TABLET BY MOUTH ONCE DAILY 15 tablet 5   No current facility-administered medications on file prior to encounter.    Filed Vitals:   12/15/15 1054  BP: 122/58  Pulse: 69  Weight: 182 lb (82.555 kg)  SpO2: 98%   PHYSICAL EXAM: General: Well appearing. No resp difficulty  HEENT: normal  Neck: supple. JVP flat. Carotids 2+ bilaterally; no bruits. No lymphadenopathy.    Cor: PMI normal. Regular rate & rhythm. 1/6 early SEM RUSB.  Lungs: clear with decreased breath sounds Abdomen: obese, soft, nontender, nondistended. No hepatosplenomegaly. No bruits or masses. Good bowel  sounds.  Extremities: no cyanosis, clubbing, rash, no edema Neuro: alert & orientedx3, cranial nerves grossly intact. Moves all 4 extremities w/o difficulty. Affect pleasant.   ASSESSMENT & PLAN:   1. Chronic systolic HF: Nonischemic cardiomyopathy (CMP out of proportion to CAD).  Echo with completely recovered heart function. NYHA I symptoms. Volume status looks good.  - Continue current lisinopril, Coreg, and spironolactone. Can cut lasix back as needed - BMET  today.  2. PAH: Suspect combination of group 1 and group 2 pulmonary hypertension. PVR 5.7 WU on RHC in 7/14, not reactive to adenosine. V/Q scan normal.  PFTs with restrictive picture and some concern for possible interstitial lung disease on prior chest CT.  Evaluated by pulmonary. Had sleep study in  November 2014 with no evidence of sleep apnea. Echo in 11/15 showed normal RV, unable to estimate PA systolic pressure.  No PH on echo today.  - Continue current sildenafil 3. CAD: Ostial 70-80% RCA lesion (07/05/2013).  No chest pain. Continue medical treatment with ASA 81 and statin. Lipids followed by Dr. Vista Lawman. 4. HTN: BP controlled.   F/u 1 year.  Glori Bickers MD 12/15/2015

## 2015-12-15 NOTE — Patient Instructions (Signed)
Follow up 1 year 

## 2015-12-15 NOTE — Progress Notes (Signed)
Advanced Heart Failure Medication Review by a Pharmacist  Does the patient  feel that his/her medications are working for him/her?  yes  Has the patient been experiencing any side effects to the medications prescribed?  no  Does the patient measure his/her own blood pressure or blood glucose at home?  no   Does the patient have any problems obtaining medications due to transportation or finances?   no  Understanding of regimen: good Understanding of indications: good Potential of compliance: good Patient understands to avoid NSAIDs. Patient understands to avoid decongestants.  Issues to address at subsequent visits: None   Pharmacist comments:  [name] is a pleasant 54 yo F presenting without a medication list but with excellent recall of her regimen including dosages. She reports good compliance and did not have any specific medication-related questions or concerns for me at this time.   [name]. Velva Harman, PharmD, BCPS, CPP Clinical Pharmacist Pager: [phone] Phone: [phone] 12/15/2015 11:01 AM      Time with patient: 6 minutes Preparation and documentation time: 2 minutes Total time: 8 minutes

## 2015-12-15 NOTE — Addendum Note (Signed)
Encounter addended by: Scarlette Calico, RN on: 12/15/2015  1:52 PM<BR>     Documentation filed: Dx Association, Orders

## 2015-12-18 ENCOUNTER — Other Ambulatory Visit (HOSPITAL_COMMUNITY): Payer: Self-pay | Admitting: *Deleted

## 2015-12-18 MED ORDER — CARVEDILOL 12.5 MG PO TABS: 12.5000 mg | ORAL_TABLET | Freq: Two times a day (BID) | ORAL | Status: DC

## 2016-01-12 ENCOUNTER — Other Ambulatory Visit (HOSPITAL_COMMUNITY): Payer: Self-pay | Admitting: Cardiology

## 2016-01-12 MED FILL — LISINOPRIL 10 MG TABLET: 10 | 30 days supply | Qty: 60 | Fill #3

## 2016-01-12 MED FILL — POTASSIUM CL ER 20 MEQ TAB: 20 | 30 days supply | Qty: 60 | Fill #0

## 2016-01-12 MED FILL — FUROSEMIDE 40 MG TABLET: 40 | 30 days supply | Qty: 60 | Fill #3

## 2016-01-12 MED FILL — SPIRONOLACTONE 25 MG TABLET: 25 | 30 days supply | Qty: 15 | Fill #3

## 2016-01-12 MED FILL — CARVEDILOL 12.5 MG TABLET: 12.5 | 30 days supply | Qty: 60 | Fill #1

## 2016-02-04 ENCOUNTER — Telehealth (HOSPITAL_COMMUNITY): Payer: Self-pay | Admitting: *Deleted

## 2016-02-04 NOTE — Telephone Encounter (Signed)
Received call from Kara Pacer at [facility] for [facility], she states pt would like to D/C the tele monitoring equipment.  Pt has been stable for quite a while now and is now f/u with [place] yearly advised as long as pt has scale and will weigh herself daily that is ok.  Order faxed to her at [phone]

## 2016-02-15 MED FILL — FUROSEMIDE 40 MG TABLET: 40 | 30 days supply | Qty: 60 | Fill #4

## 2016-02-15 MED FILL — POTASSIUM CL ER 20 MEQ TAB: 20 | 30 days supply | Qty: 60 | Fill #1

## 2016-02-15 MED FILL — SPIRONOLACTONE 25 MG TABLET: 25 | 30 days supply | Qty: 15 | Fill #4

## 2016-02-15 MED FILL — LISINOPRIL 10 MG TABLET: 10 | 30 days supply | Qty: 60 | Fill #4

## 2016-02-15 MED FILL — CARVEDILOL 12.5 MG TABLET: 12.5 | 30 days supply | Qty: 60 | Fill #2

## 2016-02-18 DIAGNOSIS — D631 Anemia in chronic kidney disease: Secondary | ICD-10-CM | POA: Diagnosis not present

## 2016-02-18 DIAGNOSIS — E669 Obesity, unspecified: Secondary | ICD-10-CM | POA: Diagnosis not present

## 2016-02-18 DIAGNOSIS — N183 Chronic kidney disease, stage 3 (moderate): Secondary | ICD-10-CM | POA: Diagnosis not present

## 2016-02-18 DIAGNOSIS — I272 Other secondary pulmonary hypertension: Secondary | ICD-10-CM | POA: Diagnosis not present

## 2016-02-18 DIAGNOSIS — I428 Other cardiomyopathies: Secondary | ICD-10-CM | POA: Diagnosis not present

## 2016-02-18 DIAGNOSIS — I129 Hypertensive chronic kidney disease with stage 1 through stage 4 chronic kidney disease, or unspecified chronic kidney disease: Secondary | ICD-10-CM | POA: Diagnosis not present

## 2016-02-18 DIAGNOSIS — I509 Heart failure, unspecified: Secondary | ICD-10-CM | POA: Diagnosis not present

## 2016-02-18 DIAGNOSIS — E1129 Type 2 diabetes mellitus with other diabetic kidney complication: Secondary | ICD-10-CM | POA: Diagnosis not present

## 2016-02-18 DIAGNOSIS — N2581 Secondary hyperparathyroidism of renal origin: Secondary | ICD-10-CM | POA: Diagnosis not present

## 2016-02-22 DIAGNOSIS — N183 Chronic kidney disease, stage 3 (moderate): Secondary | ICD-10-CM | POA: Diagnosis not present

## 2016-02-25 ENCOUNTER — Other Ambulatory Visit: Payer: Self-pay | Admitting: Nephrology

## 2016-02-25 DIAGNOSIS — N183 Chronic kidney disease, stage 3 unspecified: Secondary | ICD-10-CM

## 2016-02-25 DIAGNOSIS — I159 Secondary hypertension, unspecified: Secondary | ICD-10-CM

## 2016-03-02 ENCOUNTER — Ambulatory Visit
Admission: RE | Admit: 2016-03-02 | Discharge: 2016-03-02 | Disposition: A | Discharge status: Home / Self Care | Payer: Medicare HMO | Source: Ambulatory Visit | Attending: Nephrology | Admitting: Nephrology

## 2016-03-02 DIAGNOSIS — I159 Secondary hypertension, unspecified: Secondary | ICD-10-CM

## 2016-03-02 DIAGNOSIS — N183 Chronic kidney disease, stage 3 unspecified: Secondary | ICD-10-CM

## 2016-03-14 MED FILL — CARVEDILOL 12.5 MG TABLET: 12.5 | 30 days supply | Qty: 60 | Fill #3

## 2016-03-14 MED FILL — POTASSIUM CL ER 20 MEQ TAB: 20 | 30 days supply | Qty: 60 | Fill #2

## 2016-03-14 MED FILL — SPIRONOLACTONE 25 MG TABLET: 25 | 30 days supply | Qty: 15 | Fill #5

## 2016-03-14 MED FILL — LISINOPRIL 10 MG TABLET: 10 | 30 days supply | Qty: 60 | Fill #5

## 2016-03-14 MED FILL — FUROSEMIDE 40 MG TABLET: 40 | 30 days supply | Qty: 60 | Fill #5

## 2016-03-18 DIAGNOSIS — I119 Hypertensive heart disease without heart failure: Secondary | ICD-10-CM | POA: Diagnosis not present

## 2016-03-18 DIAGNOSIS — E119 Type 2 diabetes mellitus without complications: Secondary | ICD-10-CM | POA: Diagnosis not present

## 2016-03-18 DIAGNOSIS — E669 Obesity, unspecified: Secondary | ICD-10-CM | POA: Diagnosis not present

## 2016-03-18 DIAGNOSIS — D509 Iron deficiency anemia, unspecified: Secondary | ICD-10-CM | POA: Diagnosis not present

## 2016-03-18 DIAGNOSIS — R232 Flushing: Secondary | ICD-10-CM | POA: Diagnosis not present

## 2016-03-18 DIAGNOSIS — J45909 Unspecified asthma, uncomplicated: Secondary | ICD-10-CM | POA: Diagnosis not present

## 2016-03-18 DIAGNOSIS — I10 Essential (primary) hypertension: Secondary | ICD-10-CM | POA: Diagnosis not present

## 2016-03-18 DIAGNOSIS — I509 Heart failure, unspecified: Secondary | ICD-10-CM | POA: Diagnosis not present

## 2016-03-18 DIAGNOSIS — N183 Chronic kidney disease, stage 3 (moderate): Secondary | ICD-10-CM | POA: Diagnosis not present

## 2016-03-21 DIAGNOSIS — N183 Chronic kidney disease, stage 3 (moderate): Secondary | ICD-10-CM | POA: Diagnosis not present

## 2016-03-21 DIAGNOSIS — N2581 Secondary hyperparathyroidism of renal origin: Secondary | ICD-10-CM | POA: Diagnosis not present

## 2016-03-21 DIAGNOSIS — I129 Hypertensive chronic kidney disease with stage 1 through stage 4 chronic kidney disease, or unspecified chronic kidney disease: Secondary | ICD-10-CM | POA: Diagnosis not present

## 2016-03-21 DIAGNOSIS — E1129 Type 2 diabetes mellitus with other diabetic kidney complication: Secondary | ICD-10-CM | POA: Diagnosis not present

## 2016-03-21 DIAGNOSIS — E669 Obesity, unspecified: Secondary | ICD-10-CM | POA: Diagnosis not present

## 2016-03-21 DIAGNOSIS — I509 Heart failure, unspecified: Secondary | ICD-10-CM | POA: Diagnosis not present

## 2016-03-21 DIAGNOSIS — I428 Other cardiomyopathies: Secondary | ICD-10-CM | POA: Diagnosis not present

## 2016-03-21 DIAGNOSIS — D631 Anemia in chronic kidney disease: Secondary | ICD-10-CM | POA: Diagnosis not present

## 2016-03-21 DIAGNOSIS — I272 Other secondary pulmonary hypertension: Secondary | ICD-10-CM | POA: Diagnosis not present

## 2016-04-13 ENCOUNTER — Other Ambulatory Visit (HOSPITAL_COMMUNITY): Payer: Self-pay | Admitting: Internal Medicine

## 2016-04-13 MED FILL — LISINOPRIL 10 MG TABLET: 10 | 30 days supply | Qty: 60 | Fill #6

## 2016-04-13 MED FILL — SPIRONOLACTONE 25 MG TABLET: 25 | 30 days supply | Qty: 15 | Fill #0

## 2016-04-13 MED FILL — FUROSEMIDE 40 MG TABLET: 40 | 30 days supply | Qty: 60 | Fill #6

## 2016-04-13 MED FILL — KLOR-CON M20 TABLET: 20 | 30 days supply | Qty: 60 | Fill #3

## 2016-04-13 MED FILL — CARVEDILOL 12.5 MG TABLET: 12.5 | 30 days supply | Qty: 60 | Fill #0

## 2016-05-18 ENCOUNTER — Other Ambulatory Visit (HOSPITAL_COMMUNITY): Payer: Self-pay | Admitting: Internal Medicine

## 2016-05-18 ENCOUNTER — Other Ambulatory Visit (HOSPITAL_COMMUNITY): Payer: Self-pay | Admitting: Cardiology

## 2016-05-18 MED FILL — CARVEDILOL 12.5 MG TABLET: 12.5 | 30 days supply | Qty: 60 | Fill #1

## 2016-05-18 MED FILL — SPIRONOLACTONE 25 MG TABLET: 25 | 30 days supply | Qty: 15 | Fill #1

## 2016-05-19 MED FILL — FUROSEMIDE 40 MG TABLET: 40 | 30 days supply | Qty: 60 | Fill #0

## 2016-05-19 MED FILL — LISINOPRIL 10 MG TABLET: 10 | 30 days supply | Qty: 60 | Fill #0

## 2016-05-19 MED FILL — KLOR-CON M20 TABLET: 20 | 30 days supply | Qty: 60 | Fill #0

## 2016-05-31 DIAGNOSIS — N183 Chronic kidney disease, stage 3 (moderate): Secondary | ICD-10-CM | POA: Diagnosis not present

## 2016-05-31 DIAGNOSIS — E119 Type 2 diabetes mellitus without complications: Secondary | ICD-10-CM | POA: Diagnosis not present

## 2016-05-31 DIAGNOSIS — Z0000 Encounter for general adult medical examination without abnormal findings: Secondary | ICD-10-CM | POA: Diagnosis not present

## 2016-05-31 DIAGNOSIS — Z5181 Encounter for therapeutic drug level monitoring: Secondary | ICD-10-CM | POA: Diagnosis not present

## 2016-05-31 DIAGNOSIS — I119 Hypertensive heart disease without heart failure: Secondary | ICD-10-CM | POA: Diagnosis not present

## 2016-05-31 DIAGNOSIS — R232 Flushing: Secondary | ICD-10-CM | POA: Diagnosis not present

## 2016-05-31 DIAGNOSIS — D509 Iron deficiency anemia, unspecified: Secondary | ICD-10-CM | POA: Diagnosis not present

## 2016-05-31 DIAGNOSIS — I10 Essential (primary) hypertension: Secondary | ICD-10-CM | POA: Diagnosis not present

## 2016-05-31 DIAGNOSIS — J45909 Unspecified asthma, uncomplicated: Secondary | ICD-10-CM | POA: Diagnosis not present

## 2016-06-09 DIAGNOSIS — R404 Transient alteration of awareness: Secondary | ICD-10-CM | POA: Diagnosis not present

## 2016-06-09 DIAGNOSIS — R55 Syncope and collapse: Secondary | ICD-10-CM | POA: Diagnosis not present

## 2016-06-16 MED FILL — FUROSEMIDE 40 MG TABLET: 40 | 30 days supply | Qty: 60 | Fill #1

## 2016-06-16 MED FILL — LISINOPRIL 10 MG TABLET: 10 | 30 days supply | Qty: 60 | Fill #1

## 2016-06-16 MED FILL — SPIRONOLACTONE 25 MG TABLET: 25 | 30 days supply | Qty: 15 | Fill #2

## 2016-06-16 MED FILL — CARVEDILOL 12.5 MG TABLET: 12.5 | 30 days supply | Qty: 60 | Fill #2

## 2016-06-16 MED FILL — KLOR-CON M20 TABLET: 20 | 30 days supply | Qty: 60 | Fill #1

## 2016-06-23 DIAGNOSIS — H25013 Cortical age-related cataract, bilateral: Secondary | ICD-10-CM | POA: Diagnosis not present

## 2016-06-23 DIAGNOSIS — H2513 Age-related nuclear cataract, bilateral: Secondary | ICD-10-CM | POA: Diagnosis not present

## 2016-06-23 DIAGNOSIS — E119 Type 2 diabetes mellitus without complications: Secondary | ICD-10-CM | POA: Diagnosis not present

## 2016-06-23 DIAGNOSIS — H18603 Keratoconus, unspecified, bilateral: Secondary | ICD-10-CM | POA: Diagnosis not present

## 2016-06-23 DIAGNOSIS — H52213 Irregular astigmatism, bilateral: Secondary | ICD-10-CM | POA: Diagnosis not present

## 2016-07-01 DIAGNOSIS — E669 Obesity, unspecified: Secondary | ICD-10-CM | POA: Diagnosis not present

## 2016-07-01 DIAGNOSIS — I130 Hypertensive heart and chronic kidney disease with heart failure and stage 1 through stage 4 chronic kidney disease, or unspecified chronic kidney disease: Secondary | ICD-10-CM | POA: Diagnosis not present

## 2016-07-01 DIAGNOSIS — E785 Hyperlipidemia, unspecified: Secondary | ICD-10-CM | POA: Diagnosis not present

## 2016-07-01 DIAGNOSIS — Z6833 Body mass index (BMI) 33.0-33.9, adult: Secondary | ICD-10-CM | POA: Diagnosis not present

## 2016-07-01 DIAGNOSIS — H547 Unspecified visual loss: Secondary | ICD-10-CM | POA: Diagnosis not present

## 2016-07-01 DIAGNOSIS — Z7984 Long term (current) use of oral hypoglycemic drugs: Secondary | ICD-10-CM | POA: Diagnosis not present

## 2016-07-01 DIAGNOSIS — E1122 Type 2 diabetes mellitus with diabetic chronic kidney disease: Secondary | ICD-10-CM | POA: Diagnosis not present

## 2016-07-01 DIAGNOSIS — N182 Chronic kidney disease, stage 2 (mild): Secondary | ICD-10-CM | POA: Diagnosis not present

## 2016-07-01 DIAGNOSIS — I509 Heart failure, unspecified: Secondary | ICD-10-CM | POA: Diagnosis not present

## 2016-07-14 MED FILL — LISINOPRIL 10 MG TABLET: 10 | 30 days supply | Qty: 60 | Fill #2

## 2016-07-14 MED FILL — CARVEDILOL 12.5 MG TABLET: 12.5 | 30 days supply | Qty: 60 | Fill #3

## 2016-07-14 MED FILL — FUROSEMIDE 40 MG TABLET: 40 | 30 days supply | Qty: 60 | Fill #2

## 2016-07-14 MED FILL — SPIRONOLACTONE 25 MG TABLET: 25 | 30 days supply | Qty: 15 | Fill #3

## 2016-07-14 MED FILL — KLOR-CON M20 TABLET: 20 | 30 days supply | Qty: 60 | Fill #2

## 2016-07-19 DIAGNOSIS — I119 Hypertensive heart disease without heart failure: Secondary | ICD-10-CM | POA: Diagnosis not present

## 2016-07-19 DIAGNOSIS — I10 Essential (primary) hypertension: Secondary | ICD-10-CM | POA: Diagnosis not present

## 2016-07-19 DIAGNOSIS — E119 Type 2 diabetes mellitus without complications: Secondary | ICD-10-CM | POA: Diagnosis not present

## 2016-08-02 DIAGNOSIS — E785 Hyperlipidemia, unspecified: Secondary | ICD-10-CM | POA: Diagnosis not present

## 2016-08-02 DIAGNOSIS — I10 Essential (primary) hypertension: Secondary | ICD-10-CM | POA: Diagnosis not present

## 2016-08-02 DIAGNOSIS — J45909 Unspecified asthma, uncomplicated: Secondary | ICD-10-CM | POA: Diagnosis not present

## 2016-08-02 DIAGNOSIS — D509 Iron deficiency anemia, unspecified: Secondary | ICD-10-CM | POA: Diagnosis not present

## 2016-08-02 DIAGNOSIS — I119 Hypertensive heart disease without heart failure: Secondary | ICD-10-CM | POA: Diagnosis not present

## 2016-08-02 DIAGNOSIS — Z0000 Encounter for general adult medical examination without abnormal findings: Secondary | ICD-10-CM | POA: Diagnosis not present

## 2016-08-02 DIAGNOSIS — R232 Flushing: Secondary | ICD-10-CM | POA: Diagnosis not present

## 2016-08-02 DIAGNOSIS — E119 Type 2 diabetes mellitus without complications: Secondary | ICD-10-CM | POA: Diagnosis not present

## 2016-08-02 DIAGNOSIS — N183 Chronic kidney disease, stage 3 (moderate): Secondary | ICD-10-CM | POA: Diagnosis not present

## 2016-08-16 MED FILL — KLOR-CON M20 TABLET: 20 | 30 days supply | Qty: 60 | Fill #3

## 2016-08-16 MED FILL — SPIRONOLACTONE 25 MG TABLET: 25 | 30 days supply | Qty: 15 | Fill #4

## 2016-08-16 MED FILL — LISINOPRIL 10 MG TABLET: 10 | 30 days supply | Qty: 60 | Fill #3

## 2016-08-16 MED FILL — FUROSEMIDE 40 MG TABLET: 40 | 30 days supply | Qty: 60 | Fill #3

## 2016-08-18 ENCOUNTER — Other Ambulatory Visit (HOSPITAL_COMMUNITY): Payer: Self-pay | Admitting: Internal Medicine

## 2016-08-18 MED FILL — CARVEDILOL 12.5 MG TABLET: 12.5 | 30 days supply | Qty: 60 | Fill #0

## 2016-09-15 MED FILL — FUROSEMIDE 40 MG TABLET: 40 | 30 days supply | Qty: 60 | Fill #4

## 2016-09-15 MED FILL — SPIRONOLACTONE 25 MG TABLET: 25 | 30 days supply | Qty: 15 | Fill #5

## 2016-09-15 MED FILL — CARVEDILOL 12.5 MG TABLET: 12.5 | 30 days supply | Qty: 60 | Fill #1

## 2016-09-15 MED FILL — LISINOPRIL 10 MG TABLET: 10 | 30 days supply | Qty: 60 | Fill #4

## 2016-09-21 ENCOUNTER — Other Ambulatory Visit (HOSPITAL_COMMUNITY): Payer: Self-pay | Admitting: *Deleted

## 2016-09-21 MED ORDER — POTASSIUM CHLORIDE CRYS ER 20 MEQ PO TBCR: 40.0000 meq | EXTENDED_RELEASE_TABLET | Freq: Every day | ORAL | 3 refills | Status: DC

## 2016-09-21 MED FILL — KLOR-CON M20 TABLET: 20 | 30 days supply | Qty: 60 | Fill #0

## 2016-10-18 ENCOUNTER — Other Ambulatory Visit (HOSPITAL_COMMUNITY): Payer: Self-pay | Admitting: Internal Medicine

## 2016-10-18 MED FILL — FUROSEMIDE 40 MG TABLET: 40 | 30 days supply | Qty: 60 | Fill #5

## 2016-10-18 MED FILL — KLOR-CON M20 TABLET: 20 | 30 days supply | Qty: 60 | Fill #1

## 2016-10-18 MED FILL — SPIRONOLACTONE 25 MG TABLET: 25 | 30 days supply | Qty: 15 | Fill #0

## 2016-10-18 MED FILL — LISINOPRIL 10 MG TABLET: 10 | 30 days supply | Qty: 60 | Fill #5

## 2016-10-18 MED FILL — CARVEDILOL 12.5 MG TABLET: 12.5 | 30 days supply | Qty: 60 | Fill #2

## 2016-10-21 DIAGNOSIS — R232 Flushing: Secondary | ICD-10-CM | POA: Diagnosis not present

## 2016-10-21 DIAGNOSIS — J45909 Unspecified asthma, uncomplicated: Secondary | ICD-10-CM | POA: Diagnosis not present

## 2016-10-21 DIAGNOSIS — I509 Heart failure, unspecified: Secondary | ICD-10-CM | POA: Diagnosis not present

## 2016-10-21 DIAGNOSIS — N183 Chronic kidney disease, stage 3 (moderate): Secondary | ICD-10-CM | POA: Diagnosis not present

## 2016-10-21 DIAGNOSIS — E785 Hyperlipidemia, unspecified: Secondary | ICD-10-CM | POA: Diagnosis not present

## 2016-10-21 DIAGNOSIS — E119 Type 2 diabetes mellitus without complications: Secondary | ICD-10-CM | POA: Diagnosis not present

## 2016-10-21 DIAGNOSIS — D509 Iron deficiency anemia, unspecified: Secondary | ICD-10-CM | POA: Diagnosis not present

## 2016-10-21 DIAGNOSIS — I10 Essential (primary) hypertension: Secondary | ICD-10-CM | POA: Diagnosis not present

## 2016-10-21 DIAGNOSIS — I119 Hypertensive heart disease without heart failure: Secondary | ICD-10-CM | POA: Diagnosis not present

## 2016-11-16 MED FILL — FUROSEMIDE 40 MG TABLET: 40 | 30 days supply | Qty: 60 | Fill #6

## 2016-11-16 MED FILL — SPIRONOLACTONE 25 MG TABLET: 25 | 30 days supply | Qty: 15 | Fill #1

## 2016-11-16 MED FILL — CARVEDILOL 12.5 MG TABLET: 12.5 | 30 days supply | Qty: 60 | Fill #3

## 2016-11-16 MED FILL — KLOR-CON M20 TABLET: 20 | 30 days supply | Qty: 60 | Fill #2

## 2016-11-16 MED FILL — LISINOPRIL 10 MG TABLET: 10 | 30 days supply | Qty: 60 | Fill #6

## 2016-11-18 DIAGNOSIS — J45909 Unspecified asthma, uncomplicated: Secondary | ICD-10-CM | POA: Diagnosis not present

## 2016-11-18 DIAGNOSIS — E785 Hyperlipidemia, unspecified: Secondary | ICD-10-CM | POA: Diagnosis not present

## 2016-11-18 DIAGNOSIS — I509 Heart failure, unspecified: Secondary | ICD-10-CM | POA: Diagnosis not present

## 2016-11-18 DIAGNOSIS — E119 Type 2 diabetes mellitus without complications: Secondary | ICD-10-CM | POA: Diagnosis not present

## 2016-11-18 DIAGNOSIS — I10 Essential (primary) hypertension: Secondary | ICD-10-CM | POA: Diagnosis not present

## 2016-12-02 DIAGNOSIS — I10 Essential (primary) hypertension: Secondary | ICD-10-CM | POA: Diagnosis not present

## 2016-12-02 DIAGNOSIS — E119 Type 2 diabetes mellitus without complications: Secondary | ICD-10-CM | POA: Diagnosis not present

## 2016-12-02 DIAGNOSIS — I509 Heart failure, unspecified: Secondary | ICD-10-CM | POA: Diagnosis not present

## 2016-12-02 DIAGNOSIS — Z1231 Encounter for screening mammogram for malignant neoplasm of breast: Secondary | ICD-10-CM | POA: Diagnosis not present

## 2016-12-02 DIAGNOSIS — J45909 Unspecified asthma, uncomplicated: Secondary | ICD-10-CM | POA: Diagnosis not present

## 2016-12-02 DIAGNOSIS — Z124 Encounter for screening for malignant neoplasm of cervix: Secondary | ICD-10-CM | POA: Diagnosis not present

## 2016-12-02 DIAGNOSIS — E785 Hyperlipidemia, unspecified: Secondary | ICD-10-CM | POA: Diagnosis not present

## 2016-12-15 ENCOUNTER — Other Ambulatory Visit (HOSPITAL_COMMUNITY): Payer: Self-pay | Admitting: Internal Medicine

## 2016-12-15 MED FILL — FUROSEMIDE 40 MG TABLET: 40 | 30 days supply | Qty: 60 | Fill #0

## 2016-12-15 MED FILL — KLOR-CON M20 TABLET: 20 | 30 days supply | Qty: 60 | Fill #3

## 2016-12-15 MED FILL — CARVEDILOL 12.5 MG TABLET: 12.5 | 30 days supply | Qty: 60 | Fill #0

## 2016-12-15 MED FILL — SPIRONOLACTONE 25 MG TABLET: 25 | 30 days supply | Qty: 15 | Fill #2

## 2016-12-15 MED FILL — LISINOPRIL 10 MG TABLET: 10 | 30 days supply | Qty: 60 | Fill #0

## 2017-01-17 ENCOUNTER — Other Ambulatory Visit (HOSPITAL_COMMUNITY): Payer: Self-pay | Admitting: *Deleted

## 2017-01-17 MED ORDER — POTASSIUM CHLORIDE CRYS ER 20 MEQ PO TBCR: 40.0000 meq | EXTENDED_RELEASE_TABLET | Freq: Every day | ORAL | 3 refills | Status: DC

## 2017-01-17 MED FILL — SPIRONOLACTONE 25 MG TABLET: 25 | 30 days supply | Qty: 15 | Fill #3

## 2017-01-17 MED FILL — KLOR-CON M20 TABLET: 20 | 30 days supply | Qty: 60 | Fill #0

## 2017-01-17 MED FILL — CARVEDILOL 12.5 MG TABLET: 12.5 | 30 days supply | Qty: 60 | Fill #1

## 2017-01-17 MED FILL — FUROSEMIDE 40 MG TABLET: 40 | 30 days supply | Qty: 60 | Fill #1

## 2017-01-17 MED FILL — LISINOPRIL 10 MG TABLET: 10 | 30 days supply | Qty: 60 | Fill #1

## 2017-02-13 ENCOUNTER — Encounter (HOSPITAL_COMMUNITY): Payer: Self-pay | Admitting: Internal Medicine

## 2017-02-13 ENCOUNTER — Ambulatory Visit (HOSPITAL_COMMUNITY)
Admission: RE | Admit: 2017-02-13 | Discharge: 2017-02-13 | Disposition: A | Discharge status: Home / Self Care | Payer: Medicare HMO | Source: Ambulatory Visit | Attending: Internal Medicine | Admitting: Internal Medicine

## 2017-02-13 VITALS — BP 132/68 | HR 84 | Wt 191.0 lb

## 2017-02-13 DIAGNOSIS — I272 Pulmonary hypertension, unspecified: Secondary | ICD-10-CM | POA: Diagnosis not present

## 2017-02-13 DIAGNOSIS — I110 Hypertensive heart disease with heart failure: Secondary | ICD-10-CM | POA: Diagnosis not present

## 2017-02-13 DIAGNOSIS — Z7982 Long term (current) use of aspirin: Secondary | ICD-10-CM | POA: Diagnosis not present

## 2017-02-13 DIAGNOSIS — I10 Essential (primary) hypertension: Secondary | ICD-10-CM | POA: Diagnosis not present

## 2017-02-13 DIAGNOSIS — Z79899 Other long term (current) drug therapy: Secondary | ICD-10-CM | POA: Diagnosis not present

## 2017-02-13 DIAGNOSIS — I429 Cardiomyopathy, unspecified: Secondary | ICD-10-CM | POA: Diagnosis not present

## 2017-02-13 DIAGNOSIS — Z5189 Encounter for other specified aftercare: Secondary | ICD-10-CM | POA: Insufficient documentation

## 2017-02-13 DIAGNOSIS — I5022 Chronic systolic (congestive) heart failure: Secondary | ICD-10-CM | POA: Diagnosis not present

## 2017-02-13 DIAGNOSIS — I2510 Atherosclerotic heart disease of native coronary artery without angina pectoris: Secondary | ICD-10-CM | POA: Insufficient documentation

## 2017-02-13 MED ORDER — SILDENAFIL CITRATE 20 MG PO TABS: 40.0000 mg | ORAL_TABLET | Freq: Three times a day (TID) | ORAL | 11 refills | Status: DC

## 2017-02-13 MED ORDER — CARVEDILOL 12.5 MG PO TABS: ORAL_TABLET | ORAL | 11 refills | Status: DC

## 2017-02-13 MED ORDER — ROSUVASTATIN CALCIUM 40 MG PO TABS: 40.0000 mg | ORAL_TABLET | Freq: Every day | ORAL | 11 refills | Status: DC

## 2017-02-13 MED ORDER — FUROSEMIDE 40 MG PO TABS: 40.0000 mg | ORAL_TABLET | Freq: Every day | ORAL | 11 refills | Status: DC

## 2017-02-13 MED ORDER — LISINOPRIL 10 MG PO TABS: 10.0000 mg | ORAL_TABLET | Freq: Two times a day (BID) | ORAL | 11 refills | Status: DC

## 2017-02-13 MED ORDER — SPIRONOLACTONE 25 MG PO TABS
12.5000 mg | ORAL_TABLET | Freq: Every day | ORAL | 11 refills | Status: DC
Start: 2017-02-13 — End: 2020-05-29

## 2017-02-13 MED ORDER — POTASSIUM CHLORIDE CRYS ER 20 MEQ PO TBCR: 20.0000 meq | EXTENDED_RELEASE_TABLET | Freq: Every day | ORAL | 11 refills | Status: DC

## 2017-02-13 MED FILL — ROSUVASTATIN CALCIUM 40 MG: 40 | 30 days supply | Qty: 30 | Fill #0

## 2017-02-13 MED FILL — FUROSEMIDE 40 MG TABLET: 40 | 30 days supply | Qty: 30 | Fill #0

## 2017-02-13 MED FILL — CARVEDILOL 12.5 MG TABLET: 12.5 | 30 days supply | Qty: 60 | Fill #0

## 2017-02-13 MED FILL — LISINOPRIL 10 MG TABLET: 10 | 30 days supply | Qty: 60 | Fill #0

## 2017-02-13 MED FILL — SPIRONOLACTONE 25 MG TABLET: 25 | 30 days supply | Qty: 15 | Fill #0

## 2017-02-13 NOTE — Addendum Note (Signed)
Encounter addended by: Scarlette Calico, RN on: 02/13/2017  3:02 PM<BR>    Actions taken: Pharmacy for encounter modified, Order list changed

## 2017-02-13 NOTE — Progress Notes (Signed)
ADVANCED HF CLINIC NOTE  Patient ID: Leith-Hatfield, female   DOB: May 22, 1961, 56 y.o.   MRN: [id number] PCP: Dr. Vista Lawman  HPI:  Ms Grecco is a 56 yo with a history of nonischemic cardiomyopathy, moderate CAD.HTN, and chronic systolic heart failure now with recovered EF, anemia, PAH on slidenafil and asthma.   CT of chest 03/18/13 no PE but prominent interstitial pattern. VQ 07/02/13 Normal PFTs done 7/14 - FVC 2.1, FEV1 1.5 FEV1/FVC 72, DLCO 12.01 (restrictive picture).  ANA, HIV, ANCA, anti-GBM Ab and RF negative  Admitted to [facility] 04/17/13 through 04/19/13 with increased dyspnea. Diuresed with IV lasix. D/C weight 160. She was re-evaluated in [facility] ED on 05/31/13. Had been off all HF medications. She was restarted on carvedilol 3.125 mg bid lasix 20 mg daily, lisinopril 10 mg daily and referred to Dr. [name] Cancel for further evaluation. He performed outpatient RHC and referred her to HF Clinic for further evaluation of PH.  Echo 12/16  55-60% No PAH  Follow up for Heart Failure: She continues to do very well. Taking all meds as prescribed. Denies dyspnea, orthopnea, edema or PND. Denies CP.    Studies: .  06/13/13 RHC  Mean RA: 20 mmHg  RV: 76/12 mmHg  PA: 75/36 mmHg with a mean of 49 mmHg  PCWP: 27 mmHg  Ao sat : 94% PA sat 50%  Fick Cardiac Output: 3.1 L/minute, 1.8 L/minute/M2  PVR: 8.0 wood units 567 dysnes-sec cm5   07/05/13 R/LHC RA mean 7  RV 44/11  PA 57/27, mean 40  PA (50 mcg/kg/min adenosine) 56/27, mean 41  PA (100 mcg/kg/min adenosine) 52/24, mean 37  PA (150 mcg/kg/min adenosine) 52/25, mean 38  Unable to increase to 200 mcg/kg/min due to pump problems  PCWP mean 16  LV 127/21  AO 125/73  Oxygen saturations:  PA 69%  AO 93%  Fick CO/CI: 4.21/2.61  Thermo CO/CI: 3.53/ 2.19  PVR (Fick): 5.7 WU  PVR (Thermo): 6.8 WU .  Final Conclusions: Diffuse moderate CAD. Most significant lesion is ostial RCA stenosis. Reviewed with Dr. Angelena Form. 70-80% stenosis with ostial  damping in setting of relatively small caliber RCA. The RCA is diffusely disease. In the absence of ACS (no chest pain), would treat medically. Other coronary distributions have diffuse moderate disease.   08/29/13 - 6 MW: 408 feet/124 meters 03/18/14 6MW: 750 feet/228 meters  06/17/14: 6MW: 940 ft/286 meters 6 minute walk (10/28/14): 408 meters 6 minute walk (07/22/15): 341 meters  Labs (07/29/13 ): K 4, creatinine 0.79 Labs (9/11/ 14 ): K 4.5 Creatinine 1.2  Labs (09/03/13) : Cholesterol 198 Triglycerides 93 HDL 47 Labs 09/30/13: K 5.4 Creatinine 1.14  Labs 10/04/13: K 3.5 Creatinine 1.2 Labs 6/15: BNP 57 Labs 7/15: K 4.3, creatinine 0.95 Labs 11/15: LDL 161, BNP 50 Labs 1/16: K 3.3, creatinine 1.34, HCT 31  Echo 09/2013 EF 45-50%. RV normal. No evidence PAH by echo Echo 11/15 EF 50%, diffuse hypokinesis, normal RV size and systolic function, unable to estimate PA systolic pressure.  Echo 12/15/15 EF 60%, normal RV size and systolic function, trivial TR normal diastolic function   Had sleep study 11/05/13--> no evidence of sleep apnea.   SH: single, lives in [place], nonsmoker. Disabled.  FH:  No premature CAD.   ROS: All systems negative except as listed in HPI, PMH and Problem List.   Past Medical History:  Diagnosis Date  . Asthma    prn - occasional uses inhaler  . Chronic systolic CHF (congestive  heart failure) ([place])   . Diabetes mellitus without complication ([facility])    borderline, being monitored, no meds  . Hypercholesterolemia   . Hypertension   . Microcytic anemia   . Nonischemic cardiomyopathy ([place])    a. 2005 Cath: nonobstructive dzs;  b. 04/2013 Echo: EF 3035%, diff HK, restrictive physiology, mildly dil LA, PASP 49mmHg.  . SVD (spontaneous vaginal delivery)    x 3    Current Outpatient Prescriptions on File Prior to Encounter  Medication Sig Dispense Refill  . acetaminophen (TYLENOL) 325 MG tablet Take 325-650 mg by mouth every 6 (six) hours as needed for mild  pain (depends on pain).    Marland Kitchen albuterol (PROVENTIL HFA;VENTOLIN HFA) 108 (90 BASE) MCG/ACT inhaler Inhale 2 puffs into the lungs every 4 (four) hours as needed for wheezing or shortness of breath.    Marland Kitchen aspirin 81 MG EC tablet Take 1 tablet (81 mg total) by mouth daily. 30 tablet 12  . carvedilol (COREG) 12.5 MG tablet TAKE 1 TABLET BY MOUTH 2 TIMES DAILY WITH A MEAL. 60 tablet 3  . Cholecalciferol (VITAMIN D PO) Take 1 tablet by mouth daily.    . ferrous sulfate 325 (65 FE) MG tablet Take 325 mg by mouth daily with breakfast.    . furosemide (LASIX) 40 MG tablet TAKE 1 TABLET BY MOUTH 2 TIMES DAILY. 60 tablet 6  . lisinopril (PRINIVIL,ZESTRIL) 10 MG tablet TAKE 1 TABLET BY MOUTH 2 TIMES DAILY. 60 tablet 6  . oxyCODONE-acetaminophen (PERCOCET/ROXICET) 5-325 MG per tablet Take 1-2 tablets by mouth every 4 (four) hours as needed for severe pain (moderate to severe pain (when tolerating fluids)). 30 tablet 0  . potassium chloride SA (K-DUR,KLOR-CON) 20 MEQ tablet Take 2 tablets (40 mEq total) by mouth daily. 60 tablet 3  . rosuvastatin (CRESTOR) 40 MG tablet Take 40 mg by mouth at bedtime.    . sildenafil (REVATIO) 20 MG tablet Take 2 tablets (40 mg total) by mouth 3 (three) times daily. 180 tablet 3  . spironolactone (ALDACTONE) 25 MG tablet TAKE 1/2 TABLET BY MOUTH ONCE DAILY 15 tablet PRN   No current facility-administered medications on file prior to encounter.     Vitals:   02/13/17 1357  BP: 132/68  Pulse: 84  SpO2: 97%  Weight: 191 lb (86.6 kg)   PHYSICAL EXAM: General: Well appearing. No resp difficulty  HEENT: normal  Neck: supple. JVP flat. Carotids 2+ bilaterally; no bruits. No lymphadenopathy.    Cor: PMI normal. Regular rate & rhythm. 1/6 early SEM RUSB.  Lungs: clear with decreased breath sounds Abdomen: obese, soft, nontender, nondistended. No hepatosplenomegaly. No bruits or masses. Good bowel sounds.  Extremities: no cyanosis, clubbing, rash, no edema Neuro: alert &  orientedx3, cranial nerves grossly intact. Moves all 4 extremities w/o difficulty. Affect pleasant.   ASSESSMENT & PLAN:   1. Chronic systolic HF: Nonischemic cardiomyopathy (CMP out of proportion to CAD).  Echo with completely recovered heart function EF 55-60% 12/16. NYHA I symptoms. Volume status looks good.  - Continue current lisinopril, Coreg, and spironolactone.  - Cut lasix back to 40 daily. Take extra as needed Cut KCL back as well  - Had labs last month with Dr. Vista Lawman 2. PAH: Suspect combination of group 1 and group 2 pulmonary hypertension. PVR 5.7 WU on RHC in 7/14, not reactive to adenosine. V/Q scan normal.  PFTs with restrictive picture and some concern for possible interstitial lung disease on prior chest CT.  Evaluated by pulmonary. Had  sleep study in November 2014 with no evidence of sleep apnea.  -Echo 12/16 EF normal no PAH - Continue current sildenafil - Repeat echo next year 3. CAD: Ostial 70-80% RCA lesion (07/05/2013).  No chest pain. Continue medical treatment with ASA 81 and statin. Lipids followed by Dr. Vista Lawman. 4. HTN: BP controlled.     Glori Bickers MD 02/13/2017

## 2017-02-13 NOTE — Patient Instructions (Signed)
Decrease Furosemide to 40 mg daily  Decrease Potassium (k-dur) 20 meq daily  We will contact you in 1 year to schedule your next appointment and echocardiogram

## 2017-02-13 NOTE — Addendum Note (Signed)
Encounter addended by: Scarlette Calico, RN on: 02/13/2017  2:58 PM<BR>    Actions taken: Sign clinical note, Order list changed, Diagnosis association updated

## 2017-03-24 DIAGNOSIS — H538 Other visual disturbances: Secondary | ICD-10-CM | POA: Diagnosis not present

## 2017-03-24 DIAGNOSIS — E119 Type 2 diabetes mellitus without complications: Secondary | ICD-10-CM | POA: Diagnosis not present

## 2017-03-24 DIAGNOSIS — J45909 Unspecified asthma, uncomplicated: Secondary | ICD-10-CM | POA: Diagnosis not present

## 2017-03-24 DIAGNOSIS — Z131 Encounter for screening for diabetes mellitus: Secondary | ICD-10-CM | POA: Diagnosis not present

## 2017-03-24 DIAGNOSIS — Z01118 Encounter for examination of ears and hearing with other abnormal findings: Secondary | ICD-10-CM | POA: Diagnosis not present

## 2017-03-24 DIAGNOSIS — I10 Essential (primary) hypertension: Secondary | ICD-10-CM | POA: Diagnosis not present

## 2017-03-24 DIAGNOSIS — I509 Heart failure, unspecified: Secondary | ICD-10-CM | POA: Diagnosis not present

## 2017-03-24 DIAGNOSIS — Z136 Encounter for screening for cardiovascular disorders: Secondary | ICD-10-CM | POA: Diagnosis not present

## 2017-03-24 DIAGNOSIS — E785 Hyperlipidemia, unspecified: Secondary | ICD-10-CM | POA: Diagnosis not present

## 2017-03-24 DIAGNOSIS — Z0000 Encounter for general adult medical examination without abnormal findings: Secondary | ICD-10-CM | POA: Diagnosis not present

## 2017-03-30 MED FILL — FUROSEMIDE 40 MG TABLET: 40 | 30 days supply | Qty: 30 | Fill #1

## 2017-04-17 DIAGNOSIS — I129 Hypertensive chronic kidney disease with stage 1 through stage 4 chronic kidney disease, or unspecified chronic kidney disease: Secondary | ICD-10-CM | POA: Diagnosis not present

## 2017-04-17 DIAGNOSIS — N183 Chronic kidney disease, stage 3 (moderate): Secondary | ICD-10-CM | POA: Diagnosis not present

## 2017-04-17 DIAGNOSIS — N2581 Secondary hyperparathyroidism of renal origin: Secondary | ICD-10-CM | POA: Diagnosis not present

## 2017-04-17 DIAGNOSIS — I509 Heart failure, unspecified: Secondary | ICD-10-CM | POA: Diagnosis not present

## 2017-04-17 DIAGNOSIS — I272 Pulmonary hypertension, unspecified: Secondary | ICD-10-CM | POA: Diagnosis not present

## 2017-04-17 DIAGNOSIS — D631 Anemia in chronic kidney disease: Secondary | ICD-10-CM | POA: Diagnosis not present

## 2017-04-17 DIAGNOSIS — I2510 Atherosclerotic heart disease of native coronary artery without angina pectoris: Secondary | ICD-10-CM | POA: Diagnosis not present

## 2017-04-17 DIAGNOSIS — I428 Other cardiomyopathies: Secondary | ICD-10-CM | POA: Diagnosis not present

## 2017-04-17 DIAGNOSIS — E1129 Type 2 diabetes mellitus with other diabetic kidney complication: Secondary | ICD-10-CM | POA: Diagnosis not present

## 2017-06-01 DIAGNOSIS — E785 Hyperlipidemia, unspecified: Secondary | ICD-10-CM | POA: Diagnosis not present

## 2017-06-01 DIAGNOSIS — E119 Type 2 diabetes mellitus without complications: Secondary | ICD-10-CM | POA: Diagnosis not present

## 2017-06-01 DIAGNOSIS — Z0000 Encounter for general adult medical examination without abnormal findings: Secondary | ICD-10-CM | POA: Diagnosis not present

## 2017-06-01 DIAGNOSIS — J45909 Unspecified asthma, uncomplicated: Secondary | ICD-10-CM | POA: Diagnosis not present

## 2017-06-01 DIAGNOSIS — I10 Essential (primary) hypertension: Secondary | ICD-10-CM | POA: Diagnosis not present

## 2017-06-01 DIAGNOSIS — I509 Heart failure, unspecified: Secondary | ICD-10-CM | POA: Diagnosis not present

## 2017-06-26 DIAGNOSIS — E119 Type 2 diabetes mellitus without complications: Secondary | ICD-10-CM | POA: Diagnosis not present

## 2017-06-26 DIAGNOSIS — H2513 Age-related nuclear cataract, bilateral: Secondary | ICD-10-CM | POA: Diagnosis not present

## 2017-06-26 DIAGNOSIS — H25013 Cortical age-related cataract, bilateral: Secondary | ICD-10-CM | POA: Diagnosis not present

## 2017-06-26 DIAGNOSIS — H18603 Keratoconus, unspecified, bilateral: Secondary | ICD-10-CM | POA: Diagnosis not present

## 2017-08-09 DIAGNOSIS — E785 Hyperlipidemia, unspecified: Secondary | ICD-10-CM | POA: Diagnosis not present

## 2017-08-09 DIAGNOSIS — E119 Type 2 diabetes mellitus without complications: Secondary | ICD-10-CM | POA: Diagnosis not present

## 2017-08-09 DIAGNOSIS — I119 Hypertensive heart disease without heart failure: Secondary | ICD-10-CM | POA: Diagnosis not present

## 2017-08-15 MED FILL — FUROSEMIDE 40 MG TABLET: 40 | 30 days supply | Qty: 30 | Fill #2

## 2017-08-15 MED FILL — SPIRONOLACTONE 25 MG TABLET: 25 | 30 days supply | Qty: 15 | Fill #1

## 2017-08-15 MED FILL — LISINOPRIL 10 MG TABLET: 10 | 30 days supply | Qty: 60 | Fill #1

## 2017-08-15 MED FILL — CARVEDILOL 12.5 MG TABLET: 12.5 | 30 days supply | Qty: 60 | Fill #1

## 2017-08-24 DIAGNOSIS — I509 Heart failure, unspecified: Secondary | ICD-10-CM | POA: Diagnosis not present

## 2017-08-24 DIAGNOSIS — J45909 Unspecified asthma, uncomplicated: Secondary | ICD-10-CM | POA: Diagnosis not present

## 2017-08-24 DIAGNOSIS — E785 Hyperlipidemia, unspecified: Secondary | ICD-10-CM | POA: Diagnosis not present

## 2017-08-24 DIAGNOSIS — I10 Essential (primary) hypertension: Secondary | ICD-10-CM | POA: Diagnosis not present

## 2017-08-24 DIAGNOSIS — E119 Type 2 diabetes mellitus without complications: Secondary | ICD-10-CM | POA: Diagnosis not present

## 2017-11-07 DIAGNOSIS — E119 Type 2 diabetes mellitus without complications: Secondary | ICD-10-CM | POA: Diagnosis not present

## 2017-11-07 DIAGNOSIS — I10 Essential (primary) hypertension: Secondary | ICD-10-CM | POA: Diagnosis not present

## 2017-11-07 DIAGNOSIS — J45909 Unspecified asthma, uncomplicated: Secondary | ICD-10-CM | POA: Diagnosis not present

## 2017-11-07 DIAGNOSIS — E785 Hyperlipidemia, unspecified: Secondary | ICD-10-CM | POA: Diagnosis not present

## 2017-11-07 DIAGNOSIS — I509 Heart failure, unspecified: Secondary | ICD-10-CM | POA: Diagnosis not present

## 2017-11-17 DIAGNOSIS — H524 Presbyopia: Secondary | ICD-10-CM | POA: Diagnosis not present

## 2017-11-17 DIAGNOSIS — H5213 Myopia, bilateral: Secondary | ICD-10-CM | POA: Diagnosis not present

## 2017-11-17 DIAGNOSIS — H52203 Unspecified astigmatism, bilateral: Secondary | ICD-10-CM | POA: Diagnosis not present

## 2017-11-17 DIAGNOSIS — E119 Type 2 diabetes mellitus without complications: Secondary | ICD-10-CM | POA: Diagnosis not present

## 2017-11-17 DIAGNOSIS — H52213 Irregular astigmatism, bilateral: Secondary | ICD-10-CM | POA: Diagnosis not present

## 2017-11-17 DIAGNOSIS — H2513 Age-related nuclear cataract, bilateral: Secondary | ICD-10-CM | POA: Diagnosis not present

## 2017-11-17 DIAGNOSIS — H18613 Keratoconus, stable, bilateral: Secondary | ICD-10-CM | POA: Diagnosis not present

## 2018-01-02 DIAGNOSIS — J45909 Unspecified asthma, uncomplicated: Secondary | ICD-10-CM | POA: Diagnosis not present

## 2018-01-02 DIAGNOSIS — E785 Hyperlipidemia, unspecified: Secondary | ICD-10-CM | POA: Diagnosis not present

## 2018-01-02 DIAGNOSIS — N289 Disorder of kidney and ureter, unspecified: Secondary | ICD-10-CM | POA: Diagnosis not present

## 2018-01-02 DIAGNOSIS — I10 Essential (primary) hypertension: Secondary | ICD-10-CM | POA: Diagnosis not present

## 2018-01-02 DIAGNOSIS — I509 Heart failure, unspecified: Secondary | ICD-10-CM | POA: Diagnosis not present

## 2018-01-02 DIAGNOSIS — E119 Type 2 diabetes mellitus without complications: Secondary | ICD-10-CM | POA: Diagnosis not present

## 2018-01-03 DIAGNOSIS — Z1231 Encounter for screening mammogram for malignant neoplasm of breast: Secondary | ICD-10-CM | POA: Diagnosis not present

## 2018-02-20 DIAGNOSIS — E119 Type 2 diabetes mellitus without complications: Secondary | ICD-10-CM | POA: Diagnosis not present

## 2018-02-20 DIAGNOSIS — Z0000 Encounter for general adult medical examination without abnormal findings: Secondary | ICD-10-CM | POA: Diagnosis not present

## 2018-02-20 DIAGNOSIS — I10 Essential (primary) hypertension: Secondary | ICD-10-CM | POA: Diagnosis not present

## 2018-02-20 DIAGNOSIS — Z01118 Encounter for examination of ears and hearing with other abnormal findings: Secondary | ICD-10-CM | POA: Diagnosis not present

## 2018-02-20 DIAGNOSIS — J45909 Unspecified asthma, uncomplicated: Secondary | ICD-10-CM | POA: Diagnosis not present

## 2018-02-20 DIAGNOSIS — H538 Other visual disturbances: Secondary | ICD-10-CM | POA: Diagnosis not present

## 2018-02-20 DIAGNOSIS — I509 Heart failure, unspecified: Secondary | ICD-10-CM | POA: Diagnosis not present

## 2018-02-20 DIAGNOSIS — N289 Disorder of kidney and ureter, unspecified: Secondary | ICD-10-CM | POA: Diagnosis not present

## 2018-02-20 DIAGNOSIS — E785 Hyperlipidemia, unspecified: Secondary | ICD-10-CM | POA: Diagnosis not present

## 2018-03-30 DIAGNOSIS — I509 Heart failure, unspecified: Secondary | ICD-10-CM | POA: Diagnosis not present

## 2018-03-30 DIAGNOSIS — I2510 Atherosclerotic heart disease of native coronary artery without angina pectoris: Secondary | ICD-10-CM | POA: Diagnosis not present

## 2018-03-30 DIAGNOSIS — N183 Chronic kidney disease, stage 3 (moderate): Secondary | ICD-10-CM | POA: Diagnosis not present

## 2018-03-30 DIAGNOSIS — E785 Hyperlipidemia, unspecified: Secondary | ICD-10-CM | POA: Diagnosis not present

## 2018-03-30 DIAGNOSIS — E1129 Type 2 diabetes mellitus with other diabetic kidney complication: Secondary | ICD-10-CM | POA: Diagnosis not present

## 2018-03-30 DIAGNOSIS — D631 Anemia in chronic kidney disease: Secondary | ICD-10-CM | POA: Diagnosis not present

## 2018-03-30 DIAGNOSIS — N2581 Secondary hyperparathyroidism of renal origin: Secondary | ICD-10-CM | POA: Diagnosis not present

## 2018-03-30 DIAGNOSIS — E1122 Type 2 diabetes mellitus with diabetic chronic kidney disease: Secondary | ICD-10-CM | POA: Diagnosis not present

## 2018-03-30 DIAGNOSIS — N189 Chronic kidney disease, unspecified: Secondary | ICD-10-CM | POA: Diagnosis not present

## 2018-03-30 DIAGNOSIS — I428 Other cardiomyopathies: Secondary | ICD-10-CM | POA: Diagnosis not present

## 2018-03-30 DIAGNOSIS — I129 Hypertensive chronic kidney disease with stage 1 through stage 4 chronic kidney disease, or unspecified chronic kidney disease: Secondary | ICD-10-CM | POA: Diagnosis not present

## 2018-06-05 DIAGNOSIS — N289 Disorder of kidney and ureter, unspecified: Secondary | ICD-10-CM | POA: Diagnosis not present

## 2018-06-05 DIAGNOSIS — I10 Essential (primary) hypertension: Secondary | ICD-10-CM | POA: Diagnosis not present

## 2018-06-05 DIAGNOSIS — Z0000 Encounter for general adult medical examination without abnormal findings: Secondary | ICD-10-CM | POA: Diagnosis not present

## 2018-06-05 DIAGNOSIS — E119 Type 2 diabetes mellitus without complications: Secondary | ICD-10-CM | POA: Diagnosis not present

## 2018-06-05 DIAGNOSIS — E785 Hyperlipidemia, unspecified: Secondary | ICD-10-CM | POA: Diagnosis not present

## 2018-06-05 DIAGNOSIS — J45909 Unspecified asthma, uncomplicated: Secondary | ICD-10-CM | POA: Diagnosis not present

## 2018-06-05 DIAGNOSIS — I509 Heart failure, unspecified: Secondary | ICD-10-CM | POA: Diagnosis not present

## 2018-06-28 DIAGNOSIS — H18603 Keratoconus, unspecified, bilateral: Secondary | ICD-10-CM | POA: Diagnosis not present

## 2018-06-28 DIAGNOSIS — E119 Type 2 diabetes mellitus without complications: Secondary | ICD-10-CM | POA: Diagnosis not present

## 2018-06-28 DIAGNOSIS — H25013 Cortical age-related cataract, bilateral: Secondary | ICD-10-CM | POA: Diagnosis not present

## 2018-06-28 DIAGNOSIS — H2513 Age-related nuclear cataract, bilateral: Secondary | ICD-10-CM | POA: Diagnosis not present

## 2018-07-16 DIAGNOSIS — Z1211 Encounter for screening for malignant neoplasm of colon: Secondary | ICD-10-CM | POA: Diagnosis not present

## 2018-07-16 DIAGNOSIS — I5022 Chronic systolic (congestive) heart failure: Secondary | ICD-10-CM | POA: Diagnosis not present

## 2018-07-16 DIAGNOSIS — E119 Type 2 diabetes mellitus without complications: Secondary | ICD-10-CM | POA: Diagnosis not present

## 2018-07-24 DIAGNOSIS — Z1211 Encounter for screening for malignant neoplasm of colon: Secondary | ICD-10-CM | POA: Diagnosis not present

## 2018-07-24 DIAGNOSIS — K621 Rectal polyp: Secondary | ICD-10-CM | POA: Diagnosis not present

## 2018-07-24 DIAGNOSIS — D128 Benign neoplasm of rectum: Secondary | ICD-10-CM | POA: Diagnosis not present

## 2018-08-14 DIAGNOSIS — I509 Heart failure, unspecified: Secondary | ICD-10-CM | POA: Diagnosis not present

## 2018-08-14 DIAGNOSIS — E119 Type 2 diabetes mellitus without complications: Secondary | ICD-10-CM | POA: Diagnosis not present

## 2018-08-14 DIAGNOSIS — I10 Essential (primary) hypertension: Secondary | ICD-10-CM | POA: Diagnosis not present

## 2018-08-14 DIAGNOSIS — N289 Disorder of kidney and ureter, unspecified: Secondary | ICD-10-CM | POA: Diagnosis not present

## 2018-08-14 DIAGNOSIS — E785 Hyperlipidemia, unspecified: Secondary | ICD-10-CM | POA: Diagnosis not present

## 2018-08-16 ENCOUNTER — Other Ambulatory Visit: Payer: Self-pay | Admitting: Internal Medicine

## 2018-08-16 DIAGNOSIS — Z1231 Encounter for screening mammogram for malignant neoplasm of breast: Secondary | ICD-10-CM

## 2018-08-21 DIAGNOSIS — I10 Essential (primary) hypertension: Secondary | ICD-10-CM | POA: Diagnosis not present

## 2018-08-21 DIAGNOSIS — I509 Heart failure, unspecified: Secondary | ICD-10-CM | POA: Diagnosis not present

## 2018-08-21 DIAGNOSIS — N289 Disorder of kidney and ureter, unspecified: Secondary | ICD-10-CM | POA: Diagnosis not present

## 2018-08-21 DIAGNOSIS — J45909 Unspecified asthma, uncomplicated: Secondary | ICD-10-CM | POA: Diagnosis not present

## 2018-08-21 DIAGNOSIS — E119 Type 2 diabetes mellitus without complications: Secondary | ICD-10-CM | POA: Diagnosis not present

## 2018-08-21 DIAGNOSIS — E785 Hyperlipidemia, unspecified: Secondary | ICD-10-CM | POA: Diagnosis not present

## 2018-09-11 ENCOUNTER — Ambulatory Visit
Admission: RE | Admit: 2018-09-11 | Discharge: 2018-09-11 | Disposition: A | Discharge status: Home / Self Care | Payer: Medicare HMO | Source: Ambulatory Visit | Attending: Internal Medicine | Admitting: Internal Medicine

## 2018-09-11 ENCOUNTER — Encounter: Payer: Self-pay | Admitting: Radiology

## 2018-09-11 DIAGNOSIS — Z1231 Encounter for screening mammogram for malignant neoplasm of breast: Secondary | ICD-10-CM | POA: Diagnosis not present

## 2018-10-16 DIAGNOSIS — H524 Presbyopia: Secondary | ICD-10-CM | POA: Diagnosis not present

## 2018-10-16 DIAGNOSIS — H18613 Keratoconus, stable, bilateral: Secondary | ICD-10-CM | POA: Diagnosis not present

## 2018-10-16 DIAGNOSIS — Z7984 Long term (current) use of oral hypoglycemic drugs: Secondary | ICD-10-CM | POA: Diagnosis not present

## 2018-10-16 DIAGNOSIS — H5213 Myopia, bilateral: Secondary | ICD-10-CM | POA: Diagnosis not present

## 2018-10-16 DIAGNOSIS — H52203 Unspecified astigmatism, bilateral: Secondary | ICD-10-CM | POA: Diagnosis not present

## 2018-10-16 DIAGNOSIS — E119 Type 2 diabetes mellitus without complications: Secondary | ICD-10-CM | POA: Diagnosis not present

## 2018-10-16 DIAGNOSIS — H2513 Age-related nuclear cataract, bilateral: Secondary | ICD-10-CM | POA: Diagnosis not present

## 2018-11-13 DIAGNOSIS — E119 Type 2 diabetes mellitus without complications: Secondary | ICD-10-CM | POA: Diagnosis not present

## 2018-11-13 DIAGNOSIS — N289 Disorder of kidney and ureter, unspecified: Secondary | ICD-10-CM | POA: Diagnosis not present

## 2018-11-13 DIAGNOSIS — E782 Mixed hyperlipidemia: Secondary | ICD-10-CM | POA: Diagnosis not present

## 2018-11-13 DIAGNOSIS — J45909 Unspecified asthma, uncomplicated: Secondary | ICD-10-CM | POA: Diagnosis not present

## 2018-11-13 DIAGNOSIS — Z2821 Immunization not carried out because of patient refusal: Secondary | ICD-10-CM | POA: Diagnosis not present

## 2018-11-13 DIAGNOSIS — I509 Heart failure, unspecified: Secondary | ICD-10-CM | POA: Diagnosis not present

## 2018-11-13 DIAGNOSIS — I10 Essential (primary) hypertension: Secondary | ICD-10-CM | POA: Diagnosis not present

## 2018-11-23 DIAGNOSIS — I509 Heart failure, unspecified: Secondary | ICD-10-CM | POA: Diagnosis not present

## 2018-11-23 DIAGNOSIS — N289 Disorder of kidney and ureter, unspecified: Secondary | ICD-10-CM | POA: Diagnosis not present

## 2018-11-23 DIAGNOSIS — E119 Type 2 diabetes mellitus without complications: Secondary | ICD-10-CM | POA: Diagnosis not present

## 2018-11-23 DIAGNOSIS — Z2821 Immunization not carried out because of patient refusal: Secondary | ICD-10-CM | POA: Diagnosis not present

## 2018-11-23 DIAGNOSIS — J45909 Unspecified asthma, uncomplicated: Secondary | ICD-10-CM | POA: Diagnosis not present

## 2018-11-23 DIAGNOSIS — E782 Mixed hyperlipidemia: Secondary | ICD-10-CM | POA: Diagnosis not present

## 2018-11-23 DIAGNOSIS — I10 Essential (primary) hypertension: Secondary | ICD-10-CM | POA: Diagnosis not present

## 2018-12-06 DIAGNOSIS — J45909 Unspecified asthma, uncomplicated: Secondary | ICD-10-CM | POA: Diagnosis not present

## 2018-12-06 DIAGNOSIS — I10 Essential (primary) hypertension: Secondary | ICD-10-CM | POA: Diagnosis not present

## 2018-12-06 DIAGNOSIS — E119 Type 2 diabetes mellitus without complications: Secondary | ICD-10-CM | POA: Diagnosis not present

## 2018-12-06 DIAGNOSIS — E782 Mixed hyperlipidemia: Secondary | ICD-10-CM | POA: Diagnosis not present

## 2018-12-06 DIAGNOSIS — M545 Low back pain: Secondary | ICD-10-CM | POA: Diagnosis not present

## 2018-12-06 DIAGNOSIS — N289 Disorder of kidney and ureter, unspecified: Secondary | ICD-10-CM | POA: Diagnosis not present

## 2018-12-06 DIAGNOSIS — I509 Heart failure, unspecified: Secondary | ICD-10-CM | POA: Diagnosis not present

## 2019-01-09 ENCOUNTER — Other Ambulatory Visit: Payer: Self-pay

## 2019-01-09 ENCOUNTER — Encounter: Payer: Self-pay | Admitting: Physical Therapy

## 2019-01-09 ENCOUNTER — Ambulatory Visit: Payer: Medicare HMO | Attending: Internal Medicine | Admitting: Physical Therapy

## 2019-01-09 DIAGNOSIS — M6281 Muscle weakness (generalized): Secondary | ICD-10-CM | POA: Diagnosis not present

## 2019-01-09 DIAGNOSIS — R2689 Other abnormalities of gait and mobility: Secondary | ICD-10-CM

## 2019-01-09 DIAGNOSIS — G8929 Other chronic pain: Secondary | ICD-10-CM | POA: Insufficient documentation

## 2019-01-09 DIAGNOSIS — M545 Low back pain, unspecified: Secondary | ICD-10-CM
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
## 2019-01-09 NOTE — Patient Instructions (Signed)
    Self trigger point release using a ball on the wall to low back and buttocks

## 2019-01-17 ENCOUNTER — Ambulatory Visit: Payer: Medicare HMO | Admitting: Physical Therapy

## 2019-01-17 ENCOUNTER — Encounter: Payer: Self-pay | Admitting: Physical Therapy

## 2019-01-17 DIAGNOSIS — M545 Low back pain, unspecified: Secondary | ICD-10-CM

## 2019-01-17 DIAGNOSIS — G8929 Other chronic pain: Secondary | ICD-10-CM

## 2019-01-17 DIAGNOSIS — R2689 Other abnormalities of gait and mobility: Secondary | ICD-10-CM

## 2019-01-17 DIAGNOSIS — M6281 Muscle weakness (generalized): Secondary | ICD-10-CM

## 2019-01-17 NOTE — Therapy (Signed)
[place], [place], [address] Phone: [phone]   Fax:  [phone]  Physical Therapy Treatment  Patient Details  Name: [name] MRN: [id number] Date of Birth: 07-May-1961 Referring Provider (PT): Dr Benito Mccreedy   Encounter Date: 01/17/2019  PT End of Session - 01/17/19 1022    Visit Number  2    Number of Visits  4    Authorization Type  progress note at 10th visit    Authorization Time Period  humana - following up on authorization    PT Start Time  1022    PT Stop Time  1103    PT Time Calculation (min)  41 min       Past Medical History:  Diagnosis Date  . Asthma    prn - occasional uses inhaler  . Chronic systolic CHF (congestive heart failure) ([place])   . Diabetes mellitus without complication (HCC)    borderline, being monitored, no meds  . Hypercholesterolemia   . Hypertension   . Microcytic anemia   . Nonischemic cardiomyopathy ([place])    a. 2005 Cath: nonobstructive dzs;  b. 04/2013 Echo: EF 3035%, diff HK, restrictive physiology, mildly dil LA, PASP 71mHg.  . SVD (spontaneous vaginal delivery)    x 3    Past Surgical History:  Procedure Laterality Date  . BILATERAL SALPINGECTOMY Bilateral 01/15/2015   Procedure: BILATERAL SALPINGECTOMY;  Surgeon: SMarvene Staff MD;  [facility];  Service: Gynecology;  Laterality: Bilateral;  . CORONARY ANGIOPLASTY  2005  . LEFT AND RIGHT HEART CATHETERIZATION WITH CORONARY ANGIOGRAM N/A 07/05/2013   Procedure: LEFT AND RIGHT HEART CATHETERIZATION WITH CORONARY ANGIOGRAM;  Surgeon: DLarey Dresser MD;  [facility];  Service: Cardiovascular;  Laterality: N/A;  . RIGHT HEART CATHETERIZATION N/A 06/13/2013   Procedure: RIGHT HEART CATH;  Surgeon: PJosue Hector MD;  [facility];  Service: Cardiovascular;  Laterality: N/A;  . ROBOTIC ASSISTED TOTAL HYSTERECTOMY N/A 01/15/2015   Procedure: ROBOTIC ASSISTED TOTAL  HYSTERECTOMY;  Surgeon: SMarvene Staff MD;  [facility];  Service: Gynecology;  Laterality: N/A;  . TUBAL LIGATION    . WISDOM TOOTH EXTRACTION      There were no vitals filed for this visit.  Subjective Assessment - 01/17/19 1023    Subjective  Pt reports she is doing better, no pain today.  Did have some pain in the middle of her back yesterday - moved wrong. Started walking again , was able to go around the block .  Had some pain and a hot shower took the pain away.     Patient Stated Goals  play with her granddaughters at the park, walk the way and distance she used to      Currently in Pain?  No/denies                       [facility] Adult PT Treatment/Exercise - 01/17/19 0001      Self-Care   Self-Care  Other Self-Care Comments    Other Self-Care Comments   general back care per handout in patient education section      Lumbar Exercises: Stretches   Passive Hamstring Stretch  Left;Right;30 seconds   supine with strap   Single Knee to Chest Stretch  20 seconds;Left;Right    Hip Flexor Stretch  Left;Right;2 reps;30 seconds   lunge seated   ITB Stretch  Left;Right;30 seconds   cross body with strap  Lumbar Exercises: Aerobic   Nustep  L5x5' U/LE       Lumbar Exercises: Standing   Side Lunge  10 reps   each side with reaching out holding 2# wts     Lumbar Exercises: Seated   Other Seated Lumbar Exercises  FWD reach holding 8# x 10 VC to engage core       Lumbar Exercises: Supine   Bridge  10 reps   3 set with lat work holding 4# each hand   Isometric Hip Flexion  10 reps;5 seconds   alternating sides      Lumbar Exercises: Prone   Straight Leg Raise  10 reps   each side   Straight Leg Raises Limitations  10 reps bent knee SLR vc to keep pelvis level     Other Prone Lumbar Exercises  10 reps super man      Lumbar Exercises: Quadruped   [place]  10 reps             PT Education - 01/17/19 1035    Education Details  HEP  and back care    Person(s) Educated  Patient    Methods  Explanation;Demonstration;Handout    Comprehension  Returned demonstration;Verbal cues required;Verbalized understanding          PT Long Term Goals - 01/17/19 1027      PT LONG TERM GOAL #1   Title  I with HEP to include return to walking ( 01/19/2019)     Baseline  started walking short distances     Status  Partially Met      PT LONG TERM GOAL #2   Title  improve Rt hip strength =/> 5-/5 to allow her to get on the floor and play with her grandchildren ( 02/06/2019)     Status  On-going      PT LONG TERM GOAL #3   Title  increaese core strength to allow good stability in her back during movements and no more than 2/10 pain with daily acitivity ( 02/06/2019)     Status  On-going      PT LONG TERM GOAL #4   Title  improve FOTO =/< 37% limited ( 02/06/2019)     Status  On-going      PT LONG TERM GOAL #5   Title  improve bilat ankle DF =/> 15 degrees so pt has no more than 1-2/10 reports of tightness in her calves with walking ( 02/06/2019)     Baseline  had some cramping in calves after walk around the block    Status  On-going            Plan - 01/17/19 1105    Clinical Impression Statement  This is [name]'s second visit, she reports improvement in her symptoms and has been able to start walking.  Today she fatigued with some of the new core strengthening exerices and has begun to meet her goals. Would benefit from continued functional core work.     Rehab Potential  Good    PT Frequency  1x / week    PT Duration  4 weeks    PT Treatment/Interventions  Passive range of motion;Moist Heat;Dry needling;Spinal Manipulations;Manual techniques;Ultrasound;Patient/family education;Joint Manipulations;Taping;Therapeutic exercise;Electrical Stimulation;Cryotherapy    PT Next Visit Plan  functional core work in standing progression    Consulted and Agree with Plan of Care  Patient       Patient will benefit from skilled  therapeutic intervention in order to improve the  following deficits and impairments:  Pain, Increased muscle spasms, Decreased strength, Difficulty walking  Visit Diagnosis: Chronic midline low back pain without sciatica  Muscle weakness (generalized)  Other abnormalities of gait and mobility     Problem List Patient Active Problem List   Diagnosis Date Noted  . S/P hysterectomy 01/15/2015  . Coronary atherosclerosis of native coronary artery 07/29/2013  . Other sleep disturbances 07/02/2013  . Chronic systolic heart failure ([place]) 06/25/2013  . GERD (gastroesophageal reflux disease) 06/18/2013  . Pulmonary hypertension ([place]) 06/17/2013  . Ascites 06/07/2013  . Acute systolic CHF (congestive heart failure) ([place]) 04/19/2013  . Acute on chronic systolic CHF (congestive heart failure) ([place]) 04/19/2013  . Elevated troponin 04/19/2013  . Nonischemic cardiomyopathy ([place])   . Hypertension   . Hypercholesterolemia   . Microcytic anemia     Jeral Pinch PT  01/17/2019, 11:07 AM  [facility] [address] [place], [place], [address] Phone: [phone]   Fax:  [phone]  Name: [name] MRN: [id number] Date of Birth: 1961-04-23

## 2019-01-18 DIAGNOSIS — Z1231 Encounter for screening mammogram for malignant neoplasm of breast: Secondary | ICD-10-CM | POA: Diagnosis not present

## 2019-01-24 ENCOUNTER — Encounter: Payer: Self-pay | Admitting: Physical Therapy

## 2019-01-24 ENCOUNTER — Ambulatory Visit: Payer: Medicare HMO | Attending: Internal Medicine | Admitting: Physical Therapy

## 2019-01-24 DIAGNOSIS — R2689 Other abnormalities of gait and mobility: Secondary | ICD-10-CM | POA: Diagnosis not present

## 2019-01-24 DIAGNOSIS — G8929 Other chronic pain: Secondary | ICD-10-CM | POA: Diagnosis not present

## 2019-01-24 DIAGNOSIS — M6281 Muscle weakness (generalized): Secondary | ICD-10-CM | POA: Insufficient documentation

## 2019-01-24 DIAGNOSIS — M545 Low back pain, unspecified: Secondary | ICD-10-CM

## 2019-01-24 NOTE — Therapy (Signed)
[place], [place], [address] Phone: [phone]   Fax:  [phone]  Physical Therapy Treatment  Patient Details  Name: [name] MRN: [id number] Date of Birth: 03-30-1961 Referring Provider (PT): Dr Benito Mccreedy   Encounter Date: 01/24/2019  PT End of Session - 01/24/19 1022    Visit Number  3    Number of Visits  4    Date for PT Re-Evaluation  02/06/19    Authorization Type  progress note at 10th visit    Authorization Time Period  humana - following up on authorization    PT Start Time  1022   in late   PT Stop Time  1102    PT Time Calculation (min)  40 min    Activity Tolerance  Patient tolerated treatment well       Past Medical History:  Diagnosis Date  . Asthma    prn - occasional uses inhaler  . Chronic systolic CHF (congestive heart failure) ([place])   . Diabetes mellitus without complication (HCC)    borderline, being monitored, no meds  . Hypercholesterolemia   . Hypertension   . Microcytic anemia   . Nonischemic cardiomyopathy ([place])    a. 2005 Cath: nonobstructive dzs;  b. 04/2013 Echo: EF 3035%, diff HK, restrictive physiology, mildly dil LA, PASP 69mHg.  . SVD (spontaneous vaginal delivery)    x 3    Past Surgical History:  Procedure Laterality Date  . BILATERAL SALPINGECTOMY Bilateral 01/15/2015   Procedure: BILATERAL SALPINGECTOMY;  Surgeon: SMarvene Staff MD;  [facility];  Service: Gynecology;  Laterality: Bilateral;  . CORONARY ANGIOPLASTY  2005  . LEFT AND RIGHT HEART CATHETERIZATION WITH CORONARY ANGIOGRAM N/A 07/05/2013   Procedure: LEFT AND RIGHT HEART CATHETERIZATION WITH CORONARY ANGIOGRAM;  Surgeon: DLarey Dresser MD;  [facility];  Service: Cardiovascular;  Laterality: N/A;  . RIGHT HEART CATHETERIZATION N/A 06/13/2013   Procedure: RIGHT HEART CATH;  Surgeon: PJosue Hector MD;  [facility];  Service: Cardiovascular;   Laterality: N/A;  . ROBOTIC ASSISTED TOTAL HYSTERECTOMY N/A 01/15/2015   Procedure: ROBOTIC ASSISTED TOTAL HYSTERECTOMY;  Surgeon: SMarvene Staff MD;  [facility];  Service: Gynecology;  Laterality: N/A;  . TUBAL LIGATION    . WISDOM TOOTH EXTRACTION      There were no vitals filed for this visit.  Subjective Assessment - 01/24/19 1022    Subjective  Pt reports that overall things are going well.  The HEP is hard.  She had some pain ealier in her back this week.  She had to walk about 2.5 blocks the other day and had some tightness in her calf, no back soreness.     Patient Stated Goals  play with her granddaughters at the park, walk the way and distance she used to      Currently in Pain?  No/denies                       [facility] 0001      Lumbar Exercises: Stretches   Single Knee to Chest Stretch  Left;Right;20 seconds    Lower Trunk Rotation  2 reps    ITB Stretch  Left;Right;60 seconds   cross body ith strap     Lumbar Exercises: Aerobic   Nustep  L5x6' U/LE       Lumbar Exercises: Standing   Other Standing Lumbar Exercises  dead lifts, 20# , 3x10 reps    Other Standing Lumbar Exercises  SLS dead lift with 5# 2x10 VC for form      Lumbar Exercises: Supine   Bridge Limitations  with feet on physioball    Other Supine Lumbar Exercises  2x10 legs up, slowly lowering stopped after 10 reps and did single leg lowers     Other Supine Lumbar Exercises  small obliques reaches to opposite leg, lat work in [place] with 10#       Lumbar Exercises: Prone   Other Prone Lumbar Exercises  10x5 sec press ups                  PT Long Term Goals - 01/24/19 1026      PT LONG TERM GOAL #1   Title  I with HEP to include return to walking ( 01/19/2019)     Status  On-going      PT LONG TERM GOAL #2   Title  improve Rt hip strength =/> 5-/5 to allow her to get on the floor and play with her grandchildren ( 02/06/2019)      Baseline  pt getting on floor and up however has modified how she does it, is rolling over.      Status  Partially Met      PT LONG TERM GOAL #3   Title  increaese core strength to allow good stability in her back during movements and no more than 2/10 pain with daily acitivity ( 02/06/2019)     Status  Achieved      PT LONG TERM GOAL #4   Title  improve FOTO =/< 37% limited ( 02/06/2019)     Status  On-going      PT LONG TERM GOAL #5   Title  improve bilat ankle DF =/> 15 degrees so pt has no more than 1-2/10 reports of tightness in her calves with walking ( 02/06/2019)     Status  On-going            Plan - 01/24/19 1026    Clinical Impression Statement  Pt reports she is about 50% better in her functional activities.  Still has intermittent back pain and calf pain with walking.  Partially met her goals.  She is getting stronger, endurance is an issue and has limited her ability to walk longer distances as her calf and back will tighten up.      Rehab Potential  Good    PT Frequency  1x / week    PT Treatment/Interventions  Passive range of motion;Moist Heat;Dry needling;Spinal Manipulations;Manual techniques;Ultrasound;Patient/family education;Joint Manipulations;Taping;Therapeutic exercise;Electrical Stimulation;Cryotherapy    PT Next Visit Plan  FOTO and assess for renewal vs cont for a couple more sessions.     Consulted and Agree with Plan of Care  Patient       Patient will benefit from skilled therapeutic intervention in order to improve the following deficits and impairments:  Pain, Increased muscle spasms, Decreased strength, Difficulty walking  Visit Diagnosis: Chronic midline low back pain without sciatica  Muscle weakness (generalized)  Other abnormalities of gait and mobility     Problem List Patient Active Problem List   Diagnosis Date Noted  . S/P hysterectomy 01/15/2015  . Coronary atherosclerosis of native coronary artery 07/29/2013  . Other sleep  disturbances 07/02/2013  . Chronic systolic heart failure ([place]) 06/25/2013  . GERD (gastroesophageal reflux disease) 06/18/2013  . Pulmonary hypertension ([place]) 06/17/2013  . Ascites 06/07/2013  .  Acute systolic CHF (congestive heart failure) ([place]) 04/19/2013  . Acute on chronic systolic CHF (congestive heart failure) ([place]) 04/19/2013  . Elevated troponin 04/19/2013  . Nonischemic cardiomyopathy ([place])   . Hypertension   . Hypercholesterolemia   . Microcytic anemia     Jeral Pinch PT  01/24/2019, 11:06 AM  [facility] [address] [place], [place], [address] Phone: [phone]   Fax:  [phone]  Name: Francys Bolin MRN: [id number] Date of Birth: 05/22/1961

## 2019-01-29 DIAGNOSIS — M21611 Bunion of right foot: Secondary | ICD-10-CM | POA: Diagnosis not present

## 2019-01-29 DIAGNOSIS — E139 Other specified diabetes mellitus without complications: Secondary | ICD-10-CM | POA: Diagnosis not present

## 2019-01-29 DIAGNOSIS — M21962 Unspecified acquired deformity of left lower leg: Secondary | ICD-10-CM | POA: Diagnosis not present

## 2019-01-29 DIAGNOSIS — M21612 Bunion of left foot: Secondary | ICD-10-CM | POA: Diagnosis not present

## 2019-01-30 ENCOUNTER — Ambulatory Visit: Payer: Medicare HMO | Admitting: Physical Therapy

## 2019-01-30 ENCOUNTER — Encounter: Payer: Self-pay | Admitting: Physical Therapy

## 2019-01-30 DIAGNOSIS — R2689 Other abnormalities of gait and mobility: Secondary | ICD-10-CM

## 2019-01-30 DIAGNOSIS — G8929 Other chronic pain: Secondary | ICD-10-CM | POA: Diagnosis not present

## 2019-01-30 DIAGNOSIS — M545 Low back pain, unspecified: Secondary | ICD-10-CM

## 2019-01-30 DIAGNOSIS — M6281 Muscle weakness (generalized): Secondary | ICD-10-CM

## 2019-01-30 NOTE — Therapy (Signed)
[place], [place], [address] Phone: [phone]   Fax:  [phone]  Physical Therapy Treatment  Patient Details  Name: [name] MRN: [id number] Date of Birth: 02-11-1961 Referring Provider (PT): Dr Benito Mccreedy   Encounter Date: 01/30/2019  PT End of Session - 01/30/19 1150    Visit Number  4    Number of Visits  8    Date for PT Re-Evaluation  02/27/19    Authorization Type  progress note at 10th visit    Authorization Time Period  humana - approved for 6 months    PT Start Time  1150    PT Stop Time  1230    PT Time Calculation (min)  40 min    Activity Tolerance  Patient tolerated treatment well       Past Medical History:  Diagnosis Date  . Asthma    prn - occasional uses inhaler  . Chronic systolic CHF (congestive heart failure) ([place])   . Diabetes mellitus without complication (HCC)    borderline, being monitored, no meds  . Hypercholesterolemia   . Hypertension   . Microcytic anemia   . Nonischemic cardiomyopathy ([place])    a. 2005 Cath: nonobstructive dzs;  b. 04/2013 Echo: EF 3035%, diff HK, restrictive physiology, mildly dil LA, PASP 50mHg.  . SVD (spontaneous vaginal delivery)    x 3    Past Surgical History:  Procedure Laterality Date  . BILATERAL SALPINGECTOMY Bilateral 01/15/2015   Procedure: BILATERAL SALPINGECTOMY;  Surgeon: SMarvene Staff MD;  [facility];  Service: Gynecology;  Laterality: Bilateral;  . CORONARY ANGIOPLASTY  2005  . LEFT AND RIGHT HEART CATHETERIZATION WITH CORONARY ANGIOGRAM N/A 07/05/2013   Procedure: LEFT AND RIGHT HEART CATHETERIZATION WITH CORONARY ANGIOGRAM;  Surgeon: DLarey Dresser MD;  [facility];  Service: Cardiovascular;  Laterality: N/A;  . RIGHT HEART CATHETERIZATION N/A 06/13/2013   Procedure: RIGHT HEART CATH;  Surgeon: PJosue Hector MD;  [facility];  Service: Cardiovascular;  Laterality: N/A;  .  ROBOTIC ASSISTED TOTAL HYSTERECTOMY N/A 01/15/2015   Procedure: ROBOTIC ASSISTED TOTAL HYSTERECTOMY;  Surgeon: SMarvene Staff MD;  [facility];  Service: Gynecology;  Laterality: N/A;  . TUBAL LIGATION    . WISDOM TOOTH EXTRACTION      There were no vitals filed for this visit.  Subjective Assessment - 01/30/19 1152    Subjective  Pt reports she doesn't know what happened however her back pain returned in the Rt side.  She did pressure into the back on the door frame to work it out.  Interfered with her sleep, still sore today    Patient Stated Goals  play with her granddaughters at the park, walk the way and distance she used to      Currently in Pain?  Yes    Pain Score  7     Pain Location  Back    Pain Orientation  Right    Pain Descriptors / Indicators  Tightness    Pain Type  Chronic pain    Pain Onset  More than a month ago    Pain Frequency  Intermittent    Aggravating Factors   not sure what happened    Pain Relieving Factors  pressure into the muscles.          [facility] 01/30/19 0001      Assessment   Medical Diagnosis  Back pain  Referring Provider (PT)  Dr Iona Beard Osei-Bonsu    Onset Date/Surgical Date  01/09/17    Hand Dominance  Right    Next MD Visit  03/03/2019      Observation/Other Assessments   Focus on Therapeutic Outcomes (FOTO)   26% limited      AROM   Lumbar Flexion  palms flat on floor      Strength   Right Hip Flexion  5/5    Right Hip Extension  5/5    Right Hip ABduction  5/5    Left Hip Flexion  5/5    Left Hip Extension  5/5    Left Hip ABduction  5/5    Lumbar Flexion  --   TA  Lt fair, Rt poor   Lumbar Extension  --   multifidi Lt good, Rt fair     Palpation   Palpation comment  very tight and tender in Rt QL today, no tenderness in Lt QL or bilat gluts anymore                   [facility] Adult PT Treatment/Exercise - 01/30/19 0001      Self-Care   Other Self-Care Comments   self trigger point  release in hooklying using tennis ball to Rt QL and low back.       Lumbar Exercises: Stretches   Single Knee to Chest Stretch  Left;Right;20 seconds    Double Knee to Chest Stretch  20 seconds    Lower Trunk Rotation  1 rep;20 seconds      Lumbar Exercises: Standing   Other Standing Lumbar Exercises  2x10 wood chops holding 5#      Lumbar Exercises: Supine   Bridge Limitations  3x10 in birdge with hip/pelvic rotations    Other Supine Lumbar Exercises  15 reps alt knee to chest with TA contraction      Lumbar Exercises: Sidelying   Hip Abduction  Both;15 reps   with arm reaches overhead and to the leg     Lumbar Exercises: Quadruped   Opposite Arm/Leg Raise  Right arm/Left leg;Left arm/Right leg;5 seconds;5 reps    Opposite Arm/Leg Raise Limitations  progressed to lift with drawing in knee/arm x 10      Manual Therapy   Manual Therapy  Soft tissue mobilization    Soft tissue mobilization  in s/l STM with TRP to Rt QL until muscle relaxed                  PT Long Term Goals - 01/30/19 1158      PT LONG TERM GOAL #1   Title  I with HEP to include return to walking ( 02/27/2019)     Baseline  performing intermediate HEP and short walking distance    Time  4    Period  Weeks    Status  On-going    Target Date  02/27/19      PT LONG TERM GOAL #2   Title  improve Rt hip strength =/> 5-/5 to allow her to get on the floor and play with her grandchildren ( 02/06/2019)     Status  Achieved      PT LONG TERM GOAL #3   Title  increaese core strength to allow good stability in her back during movements and no more than 2/10 pain with daily acitivity ( 02/27/2019)     Baseline  pain returned over the weekend, had been doing better    Time  4    Period  Weeks    Status  On-going    Target Date  02/27/19      PT LONG TERM GOAL #4   Title  improve FOTO =/< 37% limited ( 02/06/2019)     Baseline  scored 26% limited - she is pushing through the pain    Status  Achieved       PT LONG TERM GOAL #5   Title  improve bilat ankle DF =/> 15 degrees so pt has no more than 1-2/10 reports of tightness in her calves with walking ( 02/27/2019)     Time  4    Period  Weeks    Status  On-going    Target Date  02/27/19      Additional Long Term Goals   Additional Long Term Goals  Yes      PT LONG TERM GOAL #6   Title  tolerate standing washing dishes without increase in pain or family noticing she is in pain for > 30' ( 02/27/2019)     Time  4    Period  Weeks    Status  New    Target Date  02/27/19            Plan - 01/30/19 1241    Clinical [place] has improved hip strength, meeting this goal.  She is still weak through her core lacking stability in the long term.  Overall reports 50% improvement with paritally met goals.  Functionally she is able to perform tasks however she does have increased pain with standing > 30' and walking longer distances.  Most of her trigger points in the back are gone, only feeling it through the Rt low back /QL area now. She would benefit from continued tx to further increase her core strength working into functional tasks      Rehab Potential  Good    PT Frequency  1x / week    PT Duration  4 weeks    PT Treatment/Interventions  Passive range of motion;Moist Heat;Dry needling;Spinal Manipulations;Manual techniques;Ultrasound;Patient/family education;Joint Manipulations;Taping;Therapeutic exercise;Electrical Stimulation;Cryotherapy    PT Next Visit Plan  functional core work.     Consulted and Agree with Plan of Care  Patient       Patient will benefit from skilled therapeutic intervention in order to improve the following deficits and impairments:  Pain, Increased muscle spasms, Decreased strength, Difficulty walking  Visit Diagnosis: Muscle weakness (generalized) - Plan: PT plan of care cert/re-cert  Other abnormalities of gait and mobility - Plan: PT plan of care cert/re-cert  Chronic midline low back  pain without sciatica - Plan: PT plan of care cert/re-cert     Problem List Patient Active Problem List   Diagnosis Date Noted  . S/P hysterectomy 01/15/2015  . Coronary atherosclerosis of native coronary artery 07/29/2013  . Other sleep disturbances 07/02/2013  . Chronic systolic heart failure ([place]) 06/25/2013  . GERD (gastroesophageal reflux disease) 06/18/2013  . Pulmonary hypertension ([place]) 06/17/2013  . Ascites 06/07/2013  . Acute systolic CHF (congestive heart failure) ([place]) 04/19/2013  . Acute on chronic systolic CHF (congestive heart failure) ([place]) 04/19/2013  . Elevated troponin 04/19/2013  . Nonischemic cardiomyopathy ([place])   . Hypertension   . Hypercholesterolemia   . Microcytic anemia     Jeral Pinch PT  01/30/2019, 12:46 PM  [facility] [address] [place], [place], [address] Phone: [phone]   Fax:  [phone]  Name: [name] MRN: [id number] Date of Birth: 04-25-1961

## 2019-02-04 ENCOUNTER — Ambulatory Visit: Payer: Medicare HMO | Admitting: Physical Therapy

## 2019-02-04 ENCOUNTER — Encounter: Payer: Self-pay | Admitting: Physical Therapy

## 2019-02-04 DIAGNOSIS — G8929 Other chronic pain: Secondary | ICD-10-CM | POA: Diagnosis not present

## 2019-02-04 DIAGNOSIS — M545 Low back pain, unspecified: Secondary | ICD-10-CM

## 2019-02-04 DIAGNOSIS — M6281 Muscle weakness (generalized): Secondary | ICD-10-CM | POA: Diagnosis not present

## 2019-02-04 DIAGNOSIS — R2689 Other abnormalities of gait and mobility: Secondary | ICD-10-CM

## 2019-02-04 NOTE — Therapy (Signed)
[place], [place], [address] Phone: [phone]   Fax:  [phone]  Physical Therapy Treatment  Patient Details  Name: Janielle Mittelstadt MRN: [id number] Date of Birth: [birth year] Referring Provider (PT): Dr Benito Mccreedy   Encounter Date: 02/04/2019    Past Medical History:  Diagnosis Date  . Asthma    prn - occasional uses inhaler  . Chronic systolic CHF (congestive heart failure) ([place])   . Diabetes mellitus without complication (HCC)    borderline, being monitored, no meds  . Hypercholesterolemia   . Hypertension   . Microcytic anemia   . Nonischemic cardiomyopathy ([place])    a. 2005 Cath: nonobstructive dzs;  b. 04/2013 Echo: EF 3035%, diff HK, restrictive physiology, mildly dil LA, PASP 13mmHg.  . SVD (spontaneous vaginal delivery)    x 3    Past Surgical History:  Procedure Laterality Date  . BILATERAL SALPINGECTOMY Bilateral 01/15/2015   Procedure: BILATERAL SALPINGECTOMY;  Surgeon: Marvene Staff, MD;  Location: [facility];  Service: Gynecology;  Laterality: Bilateral;  . CORONARY ANGIOPLASTY  2005  . LEFT AND RIGHT HEART CATHETERIZATION WITH CORONARY ANGIOGRAM N/A 07/05/2013   Procedure: LEFT AND RIGHT HEART CATHETERIZATION WITH CORONARY ANGIOGRAM;  Surgeon: Larey Dresser, MD;  Location: [facility] CATH LAB;  Service: Cardiovascular;  Laterality: N/A;  . RIGHT HEART CATHETERIZATION N/A 06/13/2013   Procedure: RIGHT HEART CATH;  Surgeon: Josue Hector, MD;  [facility];  Service: Cardiovascular;  Laterality: N/A;  . ROBOTIC ASSISTED TOTAL HYSTERECTOMY N/A 01/15/2015   Procedure: ROBOTIC ASSISTED TOTAL HYSTERECTOMY;  Surgeon: Marvene Staff, MD;  Location: [facility];  Service: Gynecology;  Laterality: N/A;  . TUBAL LIGATION    . WISDOM TOOTH EXTRACTION      There were no vitals filed for this visit.  Subjective Assessment - 02/04/19 1013    Subjective  Patient got kneed in the back  by her nephew yesterday and it put her in pain. She reports the pain has been coming and going but it is better today.     How long can you walk comfortably?  she is self limiting due to pain, tolerates about 4 blocks, has to go slow    Patient Stated Goals  play with her granddaughters at the park, walk the way and distance she used to      Currently in Pain?  Yes    Pain Score  4     Pain Location  Back    Pain Orientation  Right    Pain Descriptors / Indicators  Aching    Pain Type  Chronic pain    Pain Onset  More than a month ago    Pain Frequency  Intermittent    Aggravating Factors   standign and walking     Pain Relieving Factors  soft tissue work to the [employer] Adult PT Treatment/Exercise - 02/04/19 0001      Lumbar Exercises: Stretches   Single Knee to Chest Stretch  Left;Right;20 seconds    Double Knee to Chest Stretch  20 seconds    Lower Trunk Rotation  1 rep;20 seconds      Lumbar Exercises: Aerobic   Nustep  L5x5' U/LE       Lumbar Exercises: Standing   Other Standing Lumbar Exercises  dead lifts, 20# , 3x10 reps  Other Standing Lumbar Exercises  standing 3 way hip at counder 3x10 for home       Lumbar Exercises: Supine   Bridge Limitations  3x10 in birdge with hip/pelvic rotations    Other Supine Lumbar Exercises  15 reps alt knee to chest with TA contraction      Lumbar Exercises: Sidelying   Hip Abduction  Both;15 reps   with arm reaches overhead and to the leg     Lumbar Exercises: Quadruped   Opposite Arm/Leg Raise  Right arm/Left leg;Left arm/Right leg;5 seconds;5 reps    Opposite Arm/Leg Raise Limitations   lift with drawing in knee/arm x 10             PT Education - 02/04/19 1020    Education Details  reviewed HEp and symptom managment     Person(s) Educated  Patient    Methods  Explanation;Demonstration;Tactile cues;Verbal cues    Comprehension  Verbalized understanding;Returned  demonstration;Verbal cues required;Tactile cues required          PT Long Term Goals - 01/30/19 1158      PT LONG TERM GOAL #1   Title  I with HEP to include return to walking ( 02/27/2019)     Baseline  performing intermediate HEP and short walking distance    Time  4    Period  Weeks    Status  On-going    Target Date  02/27/19      PT LONG TERM GOAL #2   Title  improve Rt hip strength =/> 5-/5 to allow her to get on the floor and play with her grandchildren ( 02/06/2019)     Status  Achieved      PT LONG TERM GOAL #3   Title  increaese core strength to allow good stability in her back during movements and no more than 2/10 pain with daily acitivity ( 02/27/2019)     Baseline  pain returned over the weekend, had been doing better    Time  4    Period  Weeks    Status  On-going    Target Date  02/27/19      PT LONG TERM GOAL #4   Title  improve FOTO =/< 37% limited ( 02/06/2019)     Baseline  scored 26% limited - she is pushing through the pain    Status  Achieved      PT LONG TERM GOAL #5   Title  improve bilat ankle DF =/> 15 degrees so pt has no more than 1-2/10 reports of tightness in her calves with walking ( 02/27/2019)     Time  4    Period  Weeks    Status  On-going    Target Date  02/27/19      Additional Long Term Goals   Additional Long Term Goals  Yes      PT LONG TERM GOAL #6   Title  tolerate standing washing dishes without increase in pain or family noticing she is in pain for > 30' ( 02/27/2019)     Time  4    Period  Weeks    Status  New    Target Date  02/27/19            Plan - 02/04/19 1029    Clinical Impression Statement  Patient states overall the  exercises are helping her keep her back loose. She had no increase in pain today with stretches and exercises. Therapy will continue to  progress as tolerated. Patient was given some standing sink exercises today. She stated they may be easy for her to do at home during the day.     Clinical  Presentation  Stable    Clinical Decision Making  Low    Rehab Potential  Good    PT Frequency  1x / week    PT Duration  4 weeks    PT Treatment/Interventions  Passive range of motion;Moist Heat;Dry needling;Spinal Manipulations;Manual techniques;Ultrasound;Patient/family education;Joint Manipulations;Taping;Therapeutic exercise;Electrical Stimulation;Cryotherapy    PT Next Visit Plan  functional core work.     Consulted and Agree with Plan of Care  Patient       Patient will benefit from skilled therapeutic intervention in order to improve the following deficits and impairments:  Pain, Increased muscle spasms, Decreased strength, Difficulty walking  Visit Diagnosis: Muscle weakness (generalized)  Other abnormalities of gait and mobility  Chronic midline low back pain without sciatica     Problem List Patient Active Problem List   Diagnosis Date Noted  . S/P hysterectomy 01/15/2015  . Coronary atherosclerosis of native coronary artery 07/29/2013  . Other sleep disturbances 07/02/2013  . Chronic systolic heart failure ([place]) 06/25/2013  . GERD (gastroesophageal reflux disease) 06/18/2013  . Pulmonary hypertension ([place]) 06/17/2013  . Ascites 06/07/2013  . Acute systolic CHF (congestive heart failure) ([place]) 04/19/2013  . Acute on chronic systolic CHF (congestive heart failure) ([place]) 04/19/2013  . Elevated troponin 04/19/2013  . Nonischemic cardiomyopathy ([place])   . Hypertension   . Hypercholesterolemia   . Microcytic anemia     Carney Living PT DPT  02/04/2019, 11:01 AM  [facility] [address] [place], [place], [address] Phone: [phone]   Fax:  [phone]  Name: Callahan Peddie MRN: [id number] Date of Birth: 07-Feb-1961

## 2019-02-13 ENCOUNTER — Ambulatory Visit: Payer: Medicare HMO | Admitting: Physical Therapy

## 2019-02-13 ENCOUNTER — Encounter: Payer: Self-pay | Admitting: Physical Therapy

## 2019-02-13 DIAGNOSIS — M545 Low back pain, unspecified: Secondary | ICD-10-CM

## 2019-02-13 DIAGNOSIS — R2689 Other abnormalities of gait and mobility: Secondary | ICD-10-CM

## 2019-02-13 DIAGNOSIS — G8929 Other chronic pain: Secondary | ICD-10-CM | POA: Diagnosis not present

## 2019-02-13 DIAGNOSIS — M6281 Muscle weakness (generalized): Secondary | ICD-10-CM

## 2019-02-13 NOTE — Therapy (Signed)
[place], [place], [address] Phone: [phone]   Fax:  [phone]  Physical Therapy Treatment  Patient Details  Name: [name] MRN: [id number] Date of Birth: [birth year] Referring Provider (PT): Dr Benito Mccreedy   Encounter Date: 02/13/2019  PT End of Session - 02/13/19 1340    Visit Number  5    Number of Visits  8    Date for PT Re-Evaluation  02/27/19    Authorization Type  progress note at 10th visit    Authorization Time Period  humana - approved for 6 months    PT Start Time  1340   in late   PT Stop Time  1414    PT Time Calculation (min)  34 min    Activity Tolerance  Patient tolerated treatment well       Past Medical History:  Diagnosis Date  . Asthma    prn - occasional uses inhaler  . Chronic systolic CHF (congestive heart failure) ([place])   . Diabetes mellitus without complication (HCC)    borderline, being monitored, no meds  . Hypercholesterolemia   . Hypertension   . Microcytic anemia   . Nonischemic cardiomyopathy ([place])    a. 2005 Cath: nonobstructive dzs;  b. 04/2013 Echo: EF 3035%, diff HK, restrictive physiology, mildly dil LA, PASP 74mmHg.  . SVD (spontaneous vaginal delivery)    x 3    Past Surgical History:  Procedure Laterality Date  . BILATERAL SALPINGECTOMY Bilateral 01/15/2015   Procedure: BILATERAL SALPINGECTOMY;  Surgeon: Marvene Staff, MD;  Location: [facility];  Service: Gynecology;  Laterality: Bilateral;  . CORONARY ANGIOPLASTY  2005  . LEFT AND RIGHT HEART CATHETERIZATION WITH CORONARY ANGIOGRAM N/A 07/05/2013   Procedure: LEFT AND RIGHT HEART CATHETERIZATION WITH CORONARY ANGIOGRAM;  Surgeon: Larey Dresser, MD;  [facility] C[facility] LAB;  Service: Cardiovascular;  Laterality: N/A;  . RIGHT HEART CATHETERIZATION N/A 06/13/2013   Procedure: RIGHT HEART CATH;  Surgeon: Josue Hector, MD;  Location: [facility] CATH LAB;  Service: Cardiovascular;  Laterality:  N/A;  . ROBOTIC ASSISTED TOTAL HYSTERECTOMY N/A 01/15/2015   Procedure: ROBOTIC ASSISTED TOTAL HYSTERECTOMY;  Surgeon: Marvene Staff, MD;  Location: [place] [facility];  Service: Gynecology;  Laterality: N/A;  . TUBAL LIGATION    . WISDOM TOOTH EXTRACTION      There were no vitals filed for this visit.  Subjective Assessment - 02/13/19 1341    Subjective  Pt reports that overall she is doing better, doing her HEP.  Did have an episode of pain after lying down to rest, thinks maybe she got up to quick    Patient Stated Goals  play with her granddaughters at the park, walk the way and distance she used to      Currently in Pain?  No/denies                       [facility] Adult PT Treatment/Exercise - 02/13/19 0001      Self-Care   Other Self-Care Comments   self trigger point release in hooklying using tennis ball to Rt QL and low back.       Lumbar Exercises: Stretches   Passive Hamstring Stretch  Left;Right;20 seconds    Single Knee to Chest Stretch  Left;Right;20 seconds    Lower Trunk Rotation  20 seconds    Hip Flexor Stretch  Left;Right;10 seconds   in standing     Lumbar Exercises: Aerobic  Nustep  L6x5' LE       Lumbar Exercises: Standing   Side Lunge  10 reps   each side with upper body lean arms overhead   Wall Slides  20 reps   with FWD reach holding 5#   Other Standing Lumbar Exercises  2x10 wood chops holding 5# each side, 3x10 dead lift with 25#    Other Standing Lumbar Exercises  8 reps, standing hip flex with 3 sec holds without UE assist      Lumbar Exercises: Prone   Other Prone Lumbar Exercises  upper body lift x 10 with arms by side, then with reaching towards toes alternating sides      Lumbar Exercises: Quadruped   Other Quadruped Lumbar Exercises  10x5sec bear pose                  PT Long Term Goals - 01/30/19 1158      PT LONG TERM GOAL #1   Title  I with HEP to include return to walking ( 02/27/2019)     Baseline  performing  intermediate HEP and short walking distance    Time  4    Period  Weeks    Status  On-going    Target Date  02/27/19      PT LONG TERM GOAL #2   Title  improve Rt hip strength =/> 5-/5 to allow her to get on the floor and play with her grandchildren ( 02/06/2019)     Status  Achieved      PT LONG TERM GOAL #3   Title  increaese core strength to allow good stability in her back during movements and no more than 2/10 pain with daily acitivity ( 02/27/2019)     Baseline  pain returned over the weekend, had been doing better    Time  4    Period  Weeks    Status  On-going    Target Date  02/27/19      PT LONG TERM GOAL #4   Title  improve FOTO =/< 37% limited ( 02/06/2019)     Baseline  scored 26% limited - she is pushing through the pain    Status  Achieved      PT LONG TERM GOAL #5   Title  improve bilat ankle DF =/> 15 degrees so pt has no more than 1-2/10 reports of tightness in her calves with walking ( 02/27/2019)     Time  4    Period  Weeks    Status  On-going    Target Date  02/27/19      Additional Long Term Goals   Additional Long Term Goals  Yes      PT LONG TERM GOAL #6   Title  tolerate standing washing dishes without increase in pain or family noticing she is in pain for > 30' ( 02/27/2019)     Time  4    Period  Weeks    Status  New    Target Date  02/27/19            Plan - 02/13/19 1412    Clinical Impression [place] came in with no back pain today.  She did have Rt sided low back tightening with higher level standing exercise however did not feel like she needed heat or manual work to loosen it up.      Rehab Potential  Good    PT Frequency  1x / week  PT Duration  4 weeks    PT Treatment/Interventions  Passive range of motion;Moist Heat;Dry needling;Spinal Manipulations;Manual techniques;Ultrasound;Patient/family education;Joint Manipulations;Taping;Therapeutic exercise;Electrical Stimulation;Cryotherapy    PT Next Visit Plan  functional  core work.     Consulted and Agree with Plan of Care  Patient       Patient will benefit from skilled therapeutic intervention in order to improve the following deficits and impairments:  Pain, Increased muscle spasms, Decreased strength, Difficulty walking  Visit Diagnosis: Muscle weakness (generalized)  Other abnormalities of gait and mobility  Chronic midline low back pain without sciatica     Problem List Patient Active Problem List   Diagnosis Date Noted  . S/P hysterectomy 01/15/2015  . Coronary atherosclerosis of native coronary artery 07/29/2013  . Other sleep disturbances 07/02/2013  . Chronic systolic heart failure ([place]) 06/25/2013  . GERD (gastroesophageal reflux disease) 06/18/2013  . Pulmonary hypertension ([place]) 06/17/2013  . Ascites 06/07/2013  . Acute systolic CHF (congestive heart failure) ([place]) 04/19/2013  . Acute on chronic systolic CHF (congestive heart failure) ([place]) 04/19/2013  . Elevated troponin 04/19/2013  . Nonischemic cardiomyopathy ([place])   . Hypertension   . Hypercholesterolemia   . Microcytic anemia     Jeral Pinch PT  02/13/2019, 2:15 PM  [facility] [address] [place], [place], [address] Phone: [phone]   Fax:  [phone]  Name: [name] MRN: [id number] Date of Birth: 1961-11-29

## 2019-02-20 ENCOUNTER — Encounter: Payer: Self-pay | Admitting: Physical Therapy

## 2019-02-20 ENCOUNTER — Ambulatory Visit: Payer: Medicare HMO | Attending: Internal Medicine | Admitting: Physical Therapy

## 2019-02-20 DIAGNOSIS — R2689 Other abnormalities of gait and mobility: Secondary | ICD-10-CM | POA: Diagnosis not present

## 2019-02-20 DIAGNOSIS — G8929 Other chronic pain: Secondary | ICD-10-CM | POA: Insufficient documentation

## 2019-02-20 DIAGNOSIS — M545 Low back pain, unspecified: Secondary | ICD-10-CM

## 2019-02-20 DIAGNOSIS — M6281 Muscle weakness (generalized): Secondary | ICD-10-CM

## 2019-02-20 DIAGNOSIS — H35033 Hypertensive retinopathy, bilateral: Secondary | ICD-10-CM | POA: Diagnosis not present

## 2019-02-20 DIAGNOSIS — E119 Type 2 diabetes mellitus without complications: Secondary | ICD-10-CM | POA: Diagnosis not present

## 2019-02-20 DIAGNOSIS — H18603 Keratoconus, unspecified, bilateral: Secondary | ICD-10-CM | POA: Diagnosis not present

## 2019-02-20 DIAGNOSIS — H2513 Age-related nuclear cataract, bilateral: Secondary | ICD-10-CM | POA: Diagnosis not present

## 2019-02-20 NOTE — Therapy (Signed)
[place], [place], [address] Phone: [phone]   Fax:  [phone]  Physical Therapy Treatment  Patient Details  Name: [name] MRN: [id number] Date of Birth: 10/21/1961 Referring Provider (PT): Dr Benito Mccreedy   Encounter Date: 02/20/2019  PT End of Session - 02/20/19 1020    Visit Number  6    Number of Visits  8    Authorization Type  progress note at 10th visit    Authorization Time Period  humana - approved for 6 months    PT Start Time  1020    PT Stop Time  1100    PT Time Calculation (min)  40 min    Activity Tolerance  Patient tolerated treatment well       Past Medical History:  Diagnosis Date  . Asthma    prn - occasional uses inhaler  . Chronic systolic CHF (congestive heart failure) ([place])   . Diabetes mellitus without complication (HCC)    borderline, being monitored, no meds  . Hypercholesterolemia   . Hypertension   . Microcytic anemia   . Nonischemic cardiomyopathy ([place])    a. 2005 Cath: nonobstructive dzs;  b. 04/2013 Echo: EF 3035%, diff HK, restrictive physiology, mildly dil LA, PASP 50mHg.  . SVD (spontaneous vaginal delivery)    x 3    Past Surgical History:  Procedure Laterality Date  . BILATERAL SALPINGECTOMY Bilateral 01/15/2015   Procedure: BILATERAL SALPINGECTOMY;  Surgeon: SMarvene Staff MD;  [facility];  Service: Gynecology;  Laterality: Bilateral;  . CORONARY ANGIOPLASTY  2005  . LEFT AND RIGHT HEART CATHETERIZATION WITH CORONARY ANGIOGRAM N/A 07/05/2013   Procedure: LEFT AND RIGHT HEART CATHETERIZATION WITH CORONARY ANGIOGRAM;  Surgeon: DLarey Dresser MD;  [facility];  Service: Cardiovascular;  Laterality: N/A;  . RIGHT HEART CATHETERIZATION N/A 06/13/2013   Procedure: RIGHT HEART CATH;  Surgeon: PJosue Hector MD;  [facility];  Service: Cardiovascular;  Laterality: N/A;  . ROBOTIC ASSISTED TOTAL HYSTERECTOMY N/A  01/15/2015   Procedure: ROBOTIC ASSISTED TOTAL HYSTERECTOMY;  Surgeon: SMarvene Staff MD;  [facility];  Service: Gynecology;  Laterality: N/A;  . TUBAL LIGATION    . WISDOM TOOTH EXTRACTION      There were no vitals filed for this visit.  Subjective Assessment - 02/20/19 1020    Subjective  Pt did a lot of cleaning and laundry over the weekend and was sore after.  She used heat to settle it back down.     Patient Stated Goals  play with her granddaughters at the park, walk the way and distance she used to      Currently in Pain?  No/denies         [facility] 02/20/19 0001      Assessment   Medical Diagnosis  Back pain    Referring Provider (PT)  Dr GIona BeardOsei-Bonsu      AROM   Right/Left Ankle  --   Rt 13, Lt 14                  [facility] Adult PT Treatment/Exercise - 02/20/19 0001      Lumbar Exercises: Stretches   Single Knee to Chest Stretch  20 seconds;Left;Right    Lower Trunk Rotation  1 rep;20 seconds    Other Lumbar Stretch Exercise  good morning stretch for abdominals      Lumbar Exercises: Aerobic   Nustep  L6x5' U/LE       Lumbar Exercises: Machines for Strengthening   Other Lumbar Machine Exercise  2x10 standing hip ext and abduction with 25#       Lumbar Exercises: Seated   Sit to Stand  20 reps   with overhead reach holdin 2# wts   Sit to Stand Limitations  with single leg march in standing    Other Seated Lumbar Exercises  2x10 BWD leans with horizontal abduction holding 2#       Lumbar Exercises: Supine   Single Leg Bridge  --   3x10 each side   Other Supine Lumbar Exercises  2x10 legs up and mini curls    Other Supine Lumbar Exercises  10 reps pilates knee pulls       Lumbar Exercises: Prone   Straight Leg Raise  10 reps    Other Prone Lumbar Exercises  10 reps lower body lifts, then 2x10 lift with heel taps                  PT Long Term Goals - 02/20/19 1024      PT LONG TERM GOAL #1   Title  I  with HEP to include return to walking ( 02/27/2019)     Status  On-going      PT LONG TERM GOAL #2   Title  improve Rt hip strength =/> 5-/5 to allow her to get on the floor and play with her grandchildren ( 02/06/2019)     Status  Achieved      PT LONG TERM GOAL #3   Title  increaese core strength to allow good stability in her back during movements and no more than 2/10 pain with daily acitivity ( 02/27/2019)     Baseline  had increased pain over the weekend after doing a lot of cleaning and laundry    Status  On-going      PT LONG TERM GOAL #4   Title  improve FOTO =/< 37% limited ( 02/06/2019)     Status  Achieved      PT LONG TERM GOAL #5   Title  improve bilat ankle DF =/> 15 degrees so pt has no more than 1-2/10 reports of tightness in her calves with walking ( 02/27/2019)     Baseline  ROm improving, 50% less cramping in her calves    Status  Partially Met      PT LONG TERM GOAL #6   Title  tolerate standing washing dishes without increase in pain or family noticing she is in pain for > 30' ( 02/27/2019)     Status  On-going            Plan - 02/20/19 1059    Clinical Impression Statement  [name] is gaining core and LE strength.  She is making steady progress to her goals.  She does have pain after prolonged household chores however is recovering quicker.  Would benefit from a couple more visits to restore PLOF and allow for minimal pain with IADLs     Rehab Potential  Good    PT Frequency  1x / week    PT Duration  4 weeks    PT Treatment/Interventions  Passive range of motion;Moist Heat;Dry needling;Spinal Manipulations;Manual techniques;Ultrasound;Patient/family education;Joint Manipulations;Taping;Therapeutic exercise;Electrical Stimulation;Cryotherapy    PT Next Visit Plan  functional core work.     Consulted and Agree with Plan of Care  Patient       Patient will benefit from  skilled therapeutic intervention in order to improve the following deficits and  impairments:  Pain, Increased muscle spasms, Decreased strength, Difficulty walking  Visit Diagnosis: Muscle weakness (generalized)  Other abnormalities of gait and mobility  Chronic midline low back pain without sciatica     Problem List Patient Active Problem List   Diagnosis Date Noted  . S/P hysterectomy 01/15/2015  . Coronary atherosclerosis of native coronary artery 07/29/2013  . Other sleep disturbances 07/02/2013  . Chronic systolic heart failure ([place]) 06/25/2013  . GERD (gastroesophageal reflux disease) 06/18/2013  . Pulmonary hypertension ([place]) 06/17/2013  . Ascites 06/07/2013  . Acute systolic CHF (congestive heart failure) ([place]) 04/19/2013  . Acute on chronic systolic CHF (congestive heart failure) ([place]) 04/19/2013  . Elevated troponin 04/19/2013  . Nonischemic cardiomyopathy ([place])   . Hypertension   . Hypercholesterolemia   . Microcytic anemia     Jeral Pinch PT  02/20/2019, 11:04 AM  [facility] [address] [place], [place], [address] Phone: [phone]   Fax:  [phone]  Name: [name] MRN: [id number] Date of Birth: 05/16/1961

## 2019-02-25 DIAGNOSIS — E782 Mixed hyperlipidemia: Secondary | ICD-10-CM | POA: Diagnosis not present

## 2019-02-25 DIAGNOSIS — I509 Heart failure, unspecified: Secondary | ICD-10-CM | POA: Diagnosis not present

## 2019-02-25 DIAGNOSIS — J45909 Unspecified asthma, uncomplicated: Secondary | ICD-10-CM | POA: Diagnosis not present

## 2019-02-25 DIAGNOSIS — E119 Type 2 diabetes mellitus without complications: Secondary | ICD-10-CM | POA: Diagnosis not present

## 2019-02-25 DIAGNOSIS — Z0000 Encounter for general adult medical examination without abnormal findings: Secondary | ICD-10-CM | POA: Diagnosis not present

## 2019-02-25 DIAGNOSIS — Z0110 Encounter for examination of ears and hearing without abnormal findings: Secondary | ICD-10-CM | POA: Diagnosis not present

## 2019-02-25 DIAGNOSIS — N289 Disorder of kidney and ureter, unspecified: Secondary | ICD-10-CM | POA: Diagnosis not present

## 2019-02-25 DIAGNOSIS — I10 Essential (primary) hypertension: Secondary | ICD-10-CM | POA: Diagnosis not present

## 2019-02-25 DIAGNOSIS — Z0100 Encounter for examination of eyes and vision without abnormal findings: Secondary | ICD-10-CM | POA: Diagnosis not present

## 2019-02-27 ENCOUNTER — Encounter: Payer: Self-pay | Admitting: Physical Therapy

## 2019-02-27 ENCOUNTER — Other Ambulatory Visit: Payer: Self-pay

## 2019-02-27 ENCOUNTER — Ambulatory Visit: Payer: Medicare HMO | Admitting: Physical Therapy

## 2019-02-27 DIAGNOSIS — M6281 Muscle weakness (generalized): Secondary | ICD-10-CM

## 2019-02-27 DIAGNOSIS — M545 Low back pain, unspecified: Secondary | ICD-10-CM

## 2019-02-27 DIAGNOSIS — G8929 Other chronic pain: Secondary | ICD-10-CM

## 2019-02-27 DIAGNOSIS — R2689 Other abnormalities of gait and mobility: Secondary | ICD-10-CM

## 2019-02-27 NOTE — Therapy (Signed)
[place], [place], [address] Phone: [phone]   Fax:  [phone]  Physical Therapy Treatment  Patient Details  Name: [name] MRN: [id number] Date of Birth: 11/09/1961 Referring Provider (PT): Dr Benito Mccreedy   Encounter Date: 02/27/2019  PT End of Session - 02/27/19 1025    Visit Number  7    Number of Visits  8    Date for PT Re-Evaluation  02/27/19    Authorization Type  progress note at 10th visit    Authorization Time Period  humana - approved for 6 months    PT Start Time  1025   in late   PT Stop Time  1059    PT Time Calculation (min)  34 min    Activity Tolerance  Patient tolerated treatment well       Past Medical History:  Diagnosis Date  . Asthma    prn - occasional uses inhaler  . Chronic systolic CHF (congestive heart failure) ([place])   . Diabetes mellitus without complication (HCC)    borderline, being monitored, no meds  . Hypercholesterolemia   . Hypertension   . Microcytic anemia   . Nonischemic cardiomyopathy ([place])    a. 2005 Cath: nonobstructive dzs;  b. 04/2013 Echo: EF 3035%, diff HK, restrictive physiology, mildly dil LA, PASP 42mHg.  . SVD (spontaneous vaginal delivery)    x 3    Past Surgical History:  Procedure Laterality Date  . BILATERAL SALPINGECTOMY Bilateral 01/15/2015   Procedure: BILATERAL SALPINGECTOMY;  Surgeon: SMarvene Staff MD;  [facility];  Service: Gynecology;  Laterality: Bilateral;  . CORONARY ANGIOPLASTY  2005  . LEFT AND RIGHT HEART CATHETERIZATION WITH CORONARY ANGIOGRAM N/A 07/05/2013   Procedure: LEFT AND RIGHT HEART CATHETERIZATION WITH CORONARY ANGIOGRAM;  Surgeon: DLarey Dresser MD;  [facility];  Service: Cardiovascular;  Laterality: N/A;  . RIGHT HEART CATHETERIZATION N/A 06/13/2013   Procedure: RIGHT HEART CATH;  Surgeon: PJosue Hector MD;  [facility];  Service: Cardiovascular;  Laterality:  N/A;  . ROBOTIC ASSISTED TOTAL HYSTERECTOMY N/A 01/15/2015   Procedure: ROBOTIC ASSISTED TOTAL HYSTERECTOMY;  Surgeon: SMarvene Staff MD;  [facility];  Service: Gynecology;  Laterality: N/A;  . TUBAL LIGATION    . WISDOM TOOTH EXTRACTION      There were no vitals filed for this visit.  Subjective Assessment - 02/27/19 1026    Subjective  [name] reports the back differs, overall it is getting better. She did over do it again as her nephew can't lift at this time.  She lifted a case of water and felt her back    Patient Stated Goals  play with her granddaughters at the park, walk the way and distance she used to      Currently in Pain?  Yes    Pain Score  5     Pain Location  Back    Pain Orientation  Right    Pain Descriptors / Indicators  Aching;Dull    Pain Type  Chronic pain    Pain Onset  More than a month ago    Pain Frequency  Intermittent    Aggravating Factors   lifitng heavier things and prolonged walking    Pain Relieving Factors  massage and gentle motion                       [facility] Adult PT Treatment/Exercise - 02/27/19 0001  Exercises   Exercises  Lumbar      Lumbar Exercises: Stretches   Lower Trunk Rotation  30 seconds   each side with top leg straight     Lumbar Exercises: Aerobic   Nustep  L6x7' U/LE    with 30 sec push intervals after every min     Lumbar Exercises: Standing   Other Standing Lumbar Exercises  dead lift prep into dead lift using 30# kettle bell 5x5 reps       Manual Therapy   Manual Therapy  Joint mobilization    Joint Mobilization  lt s/l lumbar mobs  using lumbrical grip.                   PT Long Term Goals - 02/27/19 1057      PT LONG TERM GOAL #1   Title  I with HEP to include return to walking ( 02/27/2019)     Status  Partially Met      PT LONG TERM GOAL #2   Title  improve Rt hip strength =/> 5-/5 to allow her to get on the floor and play with her grandchildren ( 02/06/2019)      Status  Achieved      PT LONG TERM GOAL #3   Title  increaese core strength to allow good stability in her back during movements and no more than 2/10 pain with daily acitivity ( 02/27/2019)       PT LONG TERM GOAL #4   Title  improve FOTO =/< 37% limited ( 02/06/2019)     Status  On-going      PT LONG TERM GOAL #5   Title  improve bilat ankle DF =/> 15 degrees so pt has no more than 1-2/10 reports of tightness in her calves with walking ( 02/27/2019)     Status  On-going      PT LONG TERM GOAL #6   Title  tolerate standing washing dishes without increase in pain or family noticing she is in pain for > 30' ( 02/27/2019)     Status  Achieved            Plan - 02/27/19 1057    Clinical Impression Statement  [name] is progressing well, fatigued with heavy lifitng however did not have increased pain. Tightness after exercise subsided with manual work and stretches.  She has met two more goals. On track for discharge at her next visit.     Rehab Potential  Good    PT Frequency  1x / week    PT Duration  4 weeks    PT Treatment/Interventions  Passive range of motion;Moist Heat;Dry needling;Spinal Manipulations;Manual techniques;Ultrasound;Patient/family education;Joint Manipulations;Taping;Therapeutic exercise;Electrical Stimulation;Cryotherapy    PT Next Visit Plan  FOTO and finalize HEP for discharge    Consulted and Agree with Plan of Care  Patient       Patient will benefit from skilled therapeutic intervention in order to improve the following deficits and impairments:  Pain, Increased muscle spasms, Decreased strength, Difficulty walking  Visit Diagnosis: Muscle weakness (generalized)  Other abnormalities of gait and mobility  Chronic midline low back pain without sciatica     Problem List Patient Active Problem List   Diagnosis Date Noted  . S/P hysterectomy 01/15/2015  . Coronary atherosclerosis of native coronary artery 07/29/2013  . Other sleep disturbances  07/02/2013  . Chronic systolic heart failure ([place]) 06/25/2013  . GERD (gastroesophageal reflux disease) 06/18/2013  . Pulmonary hypertension ([place]) 06/17/2013  .  Ascites 06/07/2013  . Acute systolic CHF (congestive heart failure) ([place]) 04/19/2013  . Acute on chronic systolic CHF (congestive heart failure) ([place]) 04/19/2013  . Elevated troponin 04/19/2013  . Nonischemic cardiomyopathy ([place])   . Hypertension   . Hypercholesterolemia   . Microcytic anemia     [name] PT  02/27/2019, 11:00 AM  [facility] [address] [place], [place], [address] Phone: [phone]   Fax:  [phone]  Name: [name] MRN: [id number] Date of Birth: 10-16-1961

## 2019-03-06 ENCOUNTER — Encounter: Payer: Self-pay | Admitting: Physical Therapy

## 2019-03-06 ENCOUNTER — Ambulatory Visit: Payer: Medicare HMO | Admitting: Physical Therapy

## 2019-03-06 ENCOUNTER — Other Ambulatory Visit: Payer: Self-pay

## 2019-03-06 DIAGNOSIS — R2689 Other abnormalities of gait and mobility: Secondary | ICD-10-CM

## 2019-03-06 DIAGNOSIS — M545 Low back pain, unspecified: Secondary | ICD-10-CM

## 2019-03-06 DIAGNOSIS — G8929 Other chronic pain: Secondary | ICD-10-CM | POA: Diagnosis not present

## 2019-03-06 DIAGNOSIS — M6281 Muscle weakness (generalized): Secondary | ICD-10-CM

## 2019-03-06 NOTE — Therapy (Signed)
[name] [name], [place], [address] Phone: [phone]   Fax:  [phone]  Physical Therapy Treatment/Discharge  Patient Details  Name: [name] [name] MRN: [id number] Date of Birth: 1961-04-25 Referring Provider (PT): Dr [name] [name]   Encounter Date: 03/06/2019  PT End of Session - 03/06/19 1018    Visit Number  8    Number of Visits  8    Date for PT Re-Evaluation  02/27/19    PT Start Time  1018    PT Stop Time  1049    PT Time Calculation (min)  31 min    Activity Tolerance  Patient tolerated treatment well       Past Medical History:  Diagnosis Date  . Asthma    prn - occasional uses inhaler  . Chronic systolic CHF (congestive heart failure) ([place])   . Diabetes mellitus without complication (HCC)    borderline, being monitored, no meds  . Hypercholesterolemia   . Hypertension   . Microcytic anemia   . Nonischemic cardiomyopathy ([place])    a. 2005 Cath: nonobstructive dzs;  b. 04/2013 Echo: EF 3035%, diff HK, restrictive physiology, mildly dil Teresa, PASP 67mHg.  . SVD (spontaneous vaginal delivery)    x 3    Past Surgical History:  Procedure Laterality Date  . BILATERAL SALPINGECTOMY Bilateral 01/15/2015   Procedure: BILATERAL SALPINGECTOMY;  Surgeon: SMarvene Staff MD;  [facility];  Service: Gynecology;  Laterality: Bilateral;  . CORONARY ANGIOPLASTY  2005  . LEFT AND RIGHT HEART CATHETERIZATION WITH CORONARY ANGIOGRAM N/A 07/05/2013   Procedure: LEFT AND RIGHT HEART CATHETERIZATION WITH CORONARY ANGIOGRAM;  Surgeon: DLarey Dresser MD;  [facility];  Service: Cardiovascular;  Laterality: N/A;  . RIGHT HEART CATHETERIZATION N/A 06/13/2013   Procedure: RIGHT HEART CATH;  Surgeon: PJosue Hector MD;  [facility];  Service: Cardiovascular;  Laterality: N/A;  . ROBOTIC ASSISTED TOTAL HYSTERECTOMY N/A 01/15/2015   Procedure: ROBOTIC ASSISTED TOTAL HYSTERECTOMY;   Surgeon: SMarvene Staff MD;  [facility];  Service: Gynecology;  Laterality: N/A;  . TUBAL LIGATION    . WISDOM TOOTH EXTRACTION      There were no vitals filed for this visit.  Subjective Assessment - 03/06/19 1020    Subjective  Pt reports her back is doing well.  Did some heavy lifting again however stopped herself.     Patient Stated Goals  play with her granddaughters at the park, walk the way and distance she used to      Currently in Pain?  Yes    Pain Score  3     Pain Location  Back    Pain Orientation  Right    Pain Descriptors / Indicators  Dull    Pain Type  Chronic pain         [facility] PT Assessment - 03/06/19 0001      Assessment   Medical Diagnosis  Back pain    Referring Provider (PT)  Dr Teresa BeardOsei-Bonsu      Observation/Other Assessments   Focus on Therapeutic Outcomes (FOTO)   24% limited      ROM / Strength   AROM / PROM / Strength  --   RT DF 17, Lt 18     AROM   Lumbar Flexion  WNL    Lumbar Extension  WNL    Lumbar - Right Side Bend  WNL    Lumbar - Left Side Bend  WNL    Lumbar - Right Rotation  WNL    Lumbar - Left Rotation  WNL      Strength   Right/Left Hip  --   bilat 5/5                  [facility] Adult PT Treatment/Exercise - 03/06/19 0001      Lumbar Exercises: Stretches   Double Knee to Chest Stretch  30 seconds    Lower Trunk Rotation  30 seconds   with top leg straight     Lumbar Exercises: Aerobic   Nustep  L6x7' U/LE       Lumbar Exercises: Standing   Other Standing Lumbar Exercises  20 reps dead lifts with black band under feet. 20 reps goblet squats with 8#       Lumbar Exercises: Supine   Basic Lumbar Stabilization  20 reps   bicycle with black band     Lumbar Exercises: Prone   Other Prone Lumbar Exercises  20 reps superman                  PT Long Term Goals - 03/06/19 1027      PT LONG TERM GOAL #1   Title  I with HEP to include return to walking ( 02/27/2019)     Status   Achieved      PT LONG TERM GOAL #2   Title  improve Rt hip strength =/> 5-/5 to allow her to get on the floor and play with her grandchildren ( 02/06/2019)     Status  Achieved      PT LONG TERM GOAL #3   Title  increaese core strength to allow good stability in her back during movements and no more than 2/10 pain with daily acitivity ( 02/27/2019)     Baseline  able to perform all activitiy however will occassionally have pain over 2/10    Status  Partially Met      PT LONG TERM GOAL #4   Title  improve FOTO =/< 37% limited ( 02/06/2019)     Baseline  24% limited    Status  Achieved      PT LONG TERM GOAL #5   Title  improve bilat ankle DF =/> 15 degrees so pt has no more than 1-2/10 reports of tightness in her calves with walking ( 02/27/2019)     Status  Achieved      PT LONG TERM GOAL #6   Title  tolerate standing washing dishes without increase in pain or family noticing she is in pain for > 30' ( 02/27/2019)     Status  Achieved            Plan - 03/06/19 [name] has done great with therapy, met almost all her goals and has been able to return to her prior level of activity with minimal pain.  She is independent with and advacned HEP and walking program and understands the importance of contniuing with this.     PT Next Visit Plan  dishcarged to HEP     Consulted and Agree with Plan of Care  Patient       Patient will benefit from skilled therapeutic intervention in order to improve the following deficits and impairments:     Visit Diagnosis: Muscle weakness (generalized)  Other abnormalities of gait and mobility  Chronic midline low back pain without sciatica     Problem  List Patient Active Problem List   Diagnosis Date Noted  . S/P hysterectomy 01/15/2015  . Coronary atherosclerosis of native coronary artery 07/29/2013  . Other sleep disturbances 07/02/2013  . Chronic systolic heart failure ([place]) 06/25/2013  . GERD  (gastroesophageal reflux disease) 06/18/2013  . Pulmonary hypertension ([place]) 06/17/2013  . Ascites 06/07/2013  . Acute systolic CHF (congestive heart failure) ([place]) 04/19/2013  . Acute on chronic systolic CHF (congestive heart failure) ([place]) 04/19/2013  . Elevated troponin 04/19/2013  . Nonischemic cardiomyopathy ([place])   . Hypertension   . Hypercholesterolemia   . Microcytic anemia     Jeral Pinch PT  03/06/2019, 10:52 AM  [facility] [address] [place], [place], [address] Phone: [phone]   Fax:  [phone]  Name: [name] [name] MRN: [id number] Date of Birth: 20-Nov-1961   PHYSICAL THERAPY DISCHARGE SUMMARY  Visits from Start of Care: 8 Current functional level related to goals / functional outcomes: See above, great strength and ROM   Remaining deficits: Slight pain with heavy physical activity    Education / Equipment: HEP Plan: Patient agrees to discharge.  Patient goals were partially met. Patient is being discharged due to being pleased with the current functional level.  ?????    Jeral Pinch, PT 03/06/19 10:53 AM

## 2019-03-26 DIAGNOSIS — I272 Pulmonary hypertension, unspecified: Secondary | ICD-10-CM | POA: Diagnosis not present

## 2019-03-26 DIAGNOSIS — I129 Hypertensive chronic kidney disease with stage 1 through stage 4 chronic kidney disease, or unspecified chronic kidney disease: Secondary | ICD-10-CM | POA: Diagnosis not present

## 2019-03-26 DIAGNOSIS — E669 Obesity, unspecified: Secondary | ICD-10-CM | POA: Diagnosis not present

## 2019-03-26 DIAGNOSIS — I509 Heart failure, unspecified: Secondary | ICD-10-CM | POA: Diagnosis not present

## 2019-03-26 DIAGNOSIS — E785 Hyperlipidemia, unspecified: Secondary | ICD-10-CM | POA: Diagnosis not present

## 2019-03-26 DIAGNOSIS — I428 Other cardiomyopathies: Secondary | ICD-10-CM | POA: Diagnosis not present

## 2019-03-26 DIAGNOSIS — N189 Chronic kidney disease, unspecified: Secondary | ICD-10-CM | POA: Diagnosis not present

## 2019-03-26 DIAGNOSIS — N2581 Secondary hyperparathyroidism of renal origin: Secondary | ICD-10-CM | POA: Diagnosis not present

## 2019-03-26 DIAGNOSIS — D631 Anemia in chronic kidney disease: Secondary | ICD-10-CM | POA: Diagnosis not present

## 2019-03-26 DIAGNOSIS — N183 Chronic kidney disease, stage 3 (moderate): Secondary | ICD-10-CM | POA: Diagnosis not present

## 2019-06-04 DIAGNOSIS — I10 Essential (primary) hypertension: Secondary | ICD-10-CM | POA: Diagnosis not present

## 2019-06-04 DIAGNOSIS — Z0000 Encounter for general adult medical examination without abnormal findings: Secondary | ICD-10-CM | POA: Diagnosis not present

## 2019-06-04 DIAGNOSIS — E119 Type 2 diabetes mellitus without complications: Secondary | ICD-10-CM | POA: Diagnosis not present

## 2019-06-04 DIAGNOSIS — E785 Hyperlipidemia, unspecified: Secondary | ICD-10-CM | POA: Diagnosis not present

## 2019-06-04 DIAGNOSIS — N289 Disorder of kidney and ureter, unspecified: Secondary | ICD-10-CM | POA: Diagnosis not present

## 2019-06-04 DIAGNOSIS — J45909 Unspecified asthma, uncomplicated: Secondary | ICD-10-CM | POA: Diagnosis not present

## 2019-06-04 DIAGNOSIS — Z1389 Encounter for screening for other disorder: Secondary | ICD-10-CM | POA: Diagnosis not present

## 2019-06-04 DIAGNOSIS — I509 Heart failure, unspecified: Secondary | ICD-10-CM | POA: Diagnosis not present

## 2019-07-01 DIAGNOSIS — H25013 Cortical age-related cataract, bilateral: Secondary | ICD-10-CM | POA: Diagnosis not present

## 2019-07-01 DIAGNOSIS — E119 Type 2 diabetes mellitus without complications: Secondary | ICD-10-CM | POA: Diagnosis not present

## 2019-07-01 DIAGNOSIS — H18603 Keratoconus, unspecified, bilateral: Secondary | ICD-10-CM | POA: Diagnosis not present

## 2019-07-01 DIAGNOSIS — H2513 Age-related nuclear cataract, bilateral: Secondary | ICD-10-CM | POA: Diagnosis not present

## 2019-09-03 DIAGNOSIS — E1165 Type 2 diabetes mellitus with hyperglycemia: Secondary | ICD-10-CM | POA: Diagnosis not present

## 2019-09-03 DIAGNOSIS — E119 Type 2 diabetes mellitus without complications: Secondary | ICD-10-CM | POA: Diagnosis not present

## 2019-09-03 DIAGNOSIS — N289 Disorder of kidney and ureter, unspecified: Secondary | ICD-10-CM | POA: Diagnosis not present

## 2019-09-03 DIAGNOSIS — J45909 Unspecified asthma, uncomplicated: Secondary | ICD-10-CM | POA: Diagnosis not present

## 2019-09-03 DIAGNOSIS — E785 Hyperlipidemia, unspecified: Secondary | ICD-10-CM | POA: Diagnosis not present

## 2019-09-03 DIAGNOSIS — I509 Heart failure, unspecified: Secondary | ICD-10-CM | POA: Diagnosis not present

## 2019-09-03 DIAGNOSIS — E7800 Pure hypercholesterolemia, unspecified: Secondary | ICD-10-CM | POA: Diagnosis not present

## 2019-09-03 DIAGNOSIS — I10 Essential (primary) hypertension: Secondary | ICD-10-CM | POA: Diagnosis not present

## 2019-09-20 DIAGNOSIS — D631 Anemia in chronic kidney disease: Secondary | ICD-10-CM | POA: Diagnosis not present

## 2019-09-20 DIAGNOSIS — I129 Hypertensive chronic kidney disease with stage 1 through stage 4 chronic kidney disease, or unspecified chronic kidney disease: Secondary | ICD-10-CM | POA: Diagnosis not present

## 2019-09-20 DIAGNOSIS — E669 Obesity, unspecified: Secondary | ICD-10-CM | POA: Diagnosis not present

## 2019-09-20 DIAGNOSIS — N183 Chronic kidney disease, stage 3 unspecified: Secondary | ICD-10-CM | POA: Diagnosis not present

## 2019-09-20 DIAGNOSIS — N189 Chronic kidney disease, unspecified: Secondary | ICD-10-CM | POA: Diagnosis not present

## 2019-09-20 DIAGNOSIS — I509 Heart failure, unspecified: Secondary | ICD-10-CM | POA: Diagnosis not present

## 2019-09-20 DIAGNOSIS — N2581 Secondary hyperparathyroidism of renal origin: Secondary | ICD-10-CM | POA: Diagnosis not present

## 2019-10-01 DIAGNOSIS — E6609 Other obesity due to excess calories: Secondary | ICD-10-CM | POA: Diagnosis not present

## 2019-10-01 DIAGNOSIS — J4520 Mild intermittent asthma, uncomplicated: Secondary | ICD-10-CM | POA: Diagnosis not present

## 2019-10-01 DIAGNOSIS — N1831 Chronic kidney disease, stage 3a: Secondary | ICD-10-CM | POA: Diagnosis not present

## 2019-10-01 DIAGNOSIS — E7800 Pure hypercholesterolemia, unspecified: Secondary | ICD-10-CM | POA: Diagnosis not present

## 2019-10-01 DIAGNOSIS — E1165 Type 2 diabetes mellitus with hyperglycemia: Secondary | ICD-10-CM | POA: Diagnosis not present

## 2019-10-01 DIAGNOSIS — I5032 Chronic diastolic (congestive) heart failure: Secondary | ICD-10-CM | POA: Diagnosis not present

## 2019-10-01 DIAGNOSIS — I10 Essential (primary) hypertension: Secondary | ICD-10-CM | POA: Diagnosis not present

## 2020-01-09 DIAGNOSIS — Z0001 Encounter for general adult medical examination with abnormal findings: Secondary | ICD-10-CM | POA: Diagnosis not present

## 2020-01-09 DIAGNOSIS — I10 Essential (primary) hypertension: Secondary | ICD-10-CM | POA: Diagnosis not present

## 2020-01-09 DIAGNOSIS — J4520 Mild intermittent asthma, uncomplicated: Secondary | ICD-10-CM | POA: Diagnosis not present

## 2020-01-09 DIAGNOSIS — E7800 Pure hypercholesterolemia, unspecified: Secondary | ICD-10-CM | POA: Diagnosis not present

## 2020-01-09 DIAGNOSIS — Z23 Encounter for immunization: Secondary | ICD-10-CM | POA: Diagnosis not present

## 2020-01-09 DIAGNOSIS — I5032 Chronic diastolic (congestive) heart failure: Secondary | ICD-10-CM | POA: Diagnosis not present

## 2020-01-09 DIAGNOSIS — E1165 Type 2 diabetes mellitus with hyperglycemia: Secondary | ICD-10-CM | POA: Diagnosis not present

## 2020-01-09 DIAGNOSIS — N1831 Chronic kidney disease, stage 3a: Secondary | ICD-10-CM | POA: Diagnosis not present

## 2020-01-09 DIAGNOSIS — Z1329 Encounter for screening for other suspected endocrine disorder: Secondary | ICD-10-CM | POA: Diagnosis not present

## 2020-01-28 DIAGNOSIS — E139 Other specified diabetes mellitus without complications: Secondary | ICD-10-CM | POA: Diagnosis not present

## 2020-01-28 DIAGNOSIS — M21612 Bunion of left foot: Secondary | ICD-10-CM | POA: Diagnosis not present

## 2020-01-28 DIAGNOSIS — M21962 Unspecified acquired deformity of left lower leg: Secondary | ICD-10-CM | POA: Diagnosis not present

## 2020-01-28 DIAGNOSIS — M21611 Bunion of right foot: Secondary | ICD-10-CM | POA: Diagnosis not present

## 2020-01-31 DIAGNOSIS — Z1231 Encounter for screening mammogram for malignant neoplasm of breast: Secondary | ICD-10-CM | POA: Diagnosis not present

## 2020-02-06 DIAGNOSIS — J4520 Mild intermittent asthma, uncomplicated: Secondary | ICD-10-CM | POA: Diagnosis not present

## 2020-02-06 DIAGNOSIS — N1831 Chronic kidney disease, stage 3a: Secondary | ICD-10-CM | POA: Diagnosis not present

## 2020-02-06 DIAGNOSIS — E1165 Type 2 diabetes mellitus with hyperglycemia: Secondary | ICD-10-CM | POA: Diagnosis not present

## 2020-02-06 DIAGNOSIS — E7800 Pure hypercholesterolemia, unspecified: Secondary | ICD-10-CM | POA: Diagnosis not present

## 2020-02-06 DIAGNOSIS — E6609 Other obesity due to excess calories: Secondary | ICD-10-CM | POA: Diagnosis not present

## 2020-02-06 DIAGNOSIS — I10 Essential (primary) hypertension: Secondary | ICD-10-CM | POA: Diagnosis not present

## 2020-02-06 DIAGNOSIS — I5032 Chronic diastolic (congestive) heart failure: Secondary | ICD-10-CM | POA: Diagnosis not present

## 2020-02-20 DIAGNOSIS — H18603 Keratoconus, unspecified, bilateral: Secondary | ICD-10-CM | POA: Diagnosis not present

## 2020-02-20 DIAGNOSIS — E119 Type 2 diabetes mellitus without complications: Secondary | ICD-10-CM | POA: Diagnosis not present

## 2020-02-20 DIAGNOSIS — H35033 Hypertensive retinopathy, bilateral: Secondary | ICD-10-CM | POA: Diagnosis not present

## 2020-02-20 DIAGNOSIS — H04123 Dry eye syndrome of bilateral lacrimal glands: Secondary | ICD-10-CM | POA: Diagnosis not present

## 2020-02-20 DIAGNOSIS — H2513 Age-related nuclear cataract, bilateral: Secondary | ICD-10-CM | POA: Diagnosis not present

## 2020-02-25 NOTE — Progress Notes (Signed)
Patient referred by Benito Mccreedy, MD for abnormal ABI  Subjective:   [name], female    DOB: May 22, 1961, 59 y.o.   MRN: [id number]   Chief Complaint  Patient presents with  . Abnormal ABI  . New Patient (Initial Visit)    HPI  59 y.o. African-American female with hypertension, type 2 diabetes mellitus, hyperlipidemia, coronary artery disease, nonischemic cardiomyopathy with now recovered EF, pulmonary hypertension, referred for evaluation of abnormal ABI.  Patient has been on disability since her heart failure episodes in 2014.  Prior to that, she is remote at [employer].  Currently, she lives fairly sedentary lifestyle, but does look after her grandchildren have been on event.  She denies any chest pain, shortness of breath, orthopnea, PND, leg edema symptoms.  She reports pain in both calves, right more than left, with walking 3 blocks.  She denies any rest pain, ulcers, nonhealing wounds.  Between 2011 2014, patient was diagnosed with nonischemic cardiomyopathy, with nonobstructive CAD on cath, pulmonary hypertension which was thought to be out of proportion to her cardiomyopathy.  She was started on sildenafil 20 mg 3 times daily by Dr. Algernon Huxley in 2014. Since then, her overall heart failure symptomatology has essentially resolved.  Her echocardiogram in 2016 showed normal EF.  Past Medical History:  Diagnosis Date  . Asthma    prn - occasional uses inhaler  . Chronic systolic CHF (congestive heart failure) ([place])   . Diabetes mellitus without complication (HCC)    borderline, being monitored, no meds  . Hypercholesterolemia   . Hypertension   . Microcytic anemia   . Nonischemic cardiomyopathy ([place])    a. 2005 Cath: nonobstructive dzs;  b. 04/2013 Echo: EF 3035%, diff HK, restrictive physiology, mildly dil LA, PASP 33mHg.  . SVD (spontaneous vaginal delivery)    x 3     Past Surgical History:  Procedure Laterality Date  . BILATERAL SALPINGECTOMY Bilateral  01/15/2015   Procedure: BILATERAL SALPINGECTOMY;  Surgeon: SMarvene Staff MD;  [facility];  Service: Gynecology;  Laterality: Bilateral;  . CORONARY ANGIOPLASTY  2005  . LEFT AND RIGHT HEART CATHETERIZATION WITH CORONARY ANGIOGRAM N/A 07/05/2013   Procedure: LEFT AND RIGHT HEART CATHETERIZATION WITH CORONARY ANGIOGRAM;  Surgeon: DLarey Dresser MD;  [facility];  Service: Cardiovascular;  Laterality: N/A;  . RIGHT HEART CATHETERIZATION N/A 06/13/2013   Procedure: RIGHT HEART CATH;  Surgeon: PJosue Hector MD;  [facility];  Service: Cardiovascular;  Laterality: N/A;  . ROBOTIC ASSISTED TOTAL HYSTERECTOMY N/A 01/15/2015   Procedure: ROBOTIC ASSISTED TOTAL HYSTERECTOMY;  Surgeon: SMarvene Staff MD;  [facility];  Service: Gynecology;  Laterality: N/A;  . TUBAL LIGATION    . WISDOM TOOTH EXTRACTION       Social History   Tobacco Use  Smoking Status Never Smoker  Smokeless Tobacco Never Used    Social History   Substance and Sexual Activity  Alcohol Use No     Family History  Problem Relation Age of Onset  . Hypertension Sister   . Hyperlipidemia Sister   . Heart disease Brother        History of heart transplant  . Hypertension Brother   . Hypertension Sister   . Hyperlipidemia Sister   . Heart disease Brother        Coronary artery disease  . Hypertension Brother   . Heart disease Brother   . Hypertension Brother   . Heart disease Brother   .  Hypertension Brother      Current Outpatient Medications on File Prior to Visit  Medication Sig Dispense Refill  . acetaminophen (TYLENOL) 325 MG tablet Take 325-650 mg by mouth every 6 (six) hours as needed for mild pain (depends on pain).    Marland Kitchen albuterol (PROVENTIL HFA;VENTOLIN HFA) 108 (90 BASE) MCG/ACT inhaler Inhale 2 puffs into the lungs every 4 (four) hours as needed for wheezing or shortness of breath.    Marland Kitchen aspirin 81 MG EC tablet Take 1 tablet (81 mg total) by mouth daily. 30  tablet 12  . carvedilol (COREG) 12.5 MG tablet TAKE 1 TABLET BY MOUTH 2 TIMES DAILY WITH A MEAL. 60 tablet 11  . Cholecalciferol (VITAMIN D PO) Take 1 tablet by mouth daily.    . ferrous sulfate 325 (65 FE) MG tablet Take 325 mg by mouth daily with breakfast.    . furosemide (LASIX) 40 MG tablet Take 1 tablet (40 mg total) by mouth daily. 30 tablet 11  . lisinopril (PRINIVIL,ZESTRIL) 10 MG tablet Take 1 tablet (10 mg total) by mouth 2 (two) times daily. 60 tablet 11  . metFORMIN (GLUCOPHAGE) 850 MG tablet Take 1 tablet by mouth 2 (two) times daily.    Marland Kitchen oxyCODONE-acetaminophen (PERCOCET/ROXICET) 5-325 MG per tablet Take 1-2 tablets by mouth every 4 (four) hours as needed for severe pain (moderate to severe pain (when tolerating fluids)). 30 tablet 0  . rosuvastatin (CRESTOR) 40 MG tablet Take 1 tablet (40 mg total) by mouth at bedtime. 30 tablet 11  . spironolactone (ALDACTONE) 25 MG tablet Take 0.5 tablets (12.5 mg total) by mouth daily. 15 tablet 11  . potassium chloride SA (K-DUR,KLOR-CON) 20 MEQ tablet Take 1 tablet (20 mEq total) by mouth daily. 30 tablet 11  . sildenafil (REVATIO) 20 MG tablet Take 2 tablets (40 mg total) by mouth 3 (three) times daily. 180 tablet 11   No current facility-administered medications on file prior to visit.    Cardiovascular and other pertinent studies:  EKG 02/26/2020: Sinus rhythm 74 bpm.  Old inferior infarct.   ABI 02/06/2020: Rt 0.68 Lt 0.88  LHC/RHC 06/2013: LM: No significant disease LAD: 50% mid LAD stenosis with distal tapering.  LCx: Moderate early OM1 with 50% ostial stenosis. Mid LCx with 40% stenosis.  RCA: Diffuse RCA disease with 70-80% ostial stenosis and damping of the waveform; serial 50% stenoses in the mid and distal RCA.  Reviewed with Dr. Angelena Form.  70-80% stenosis with ostial damping in setting of relatively small caliber RCA.  The RCA is diffusely disease.  In the absence of ACS (no chest pain), would treat medically  Left  ventriculography: Global hypokinesis with EF estimated at 35%.  Optimized PCWP on current dose of Lasix (16 mmHg).  Pulmonary arterial HTN with elevated PVR, not responsive to adenosine.  Given co-existing LV dysfunction, I am going to try her on sildenafil 20 mg tid.   She will followup in clinic in about 2 wks.   [place] 05/2013: Mean RA:  20  mmHg RV: 76/12 mmHg PA: 75/36 mmHg with a mean of 49 mmHg PCWP: 27 mmHg  Ao sat : 94% PA sat 50%   Fick Cardiac Output:  3.1 L/minute,  1.8 L/min minute INR INR ute/M2  PVR: 8.0 wood unites 567 dysnes-sec cm5  Impression:  PAD significantly higher than PCWP indicating some intrinsic pulmonary hypertension.  Increase diuretics and add nitrates Refer to CHF clinic and continue f/u with pulmonary ? [place]  Echocardiogram 11/2015: - Left  ventricle: The cavity size was normal. Systolic function was  normal. The estimated ejection fraction was in the range of 55%  to 60%. Wall motion was normal; there were no regional wall  motion abnormalities. Left ventricular diastolic function  parameters were normal.  - Left atrium: The atrium was mildly dilated.   Impressions:   - Global LV longitudinal strain -18.3%.  Parameters of both systolic and diastolic LV function show  improvement compared with previous reports.   Echocardiogram 07/2015: - Left ventricle: The cavity size was normal. Wall thickness was  normal. The estimated ejection fraction was 50%. Diffuse  hypokinesis. Features are consistent with a pseudonormal left  ventricular filling pattern, with concomitant abnormal relaxation  and increased filling pressure (grade 2 diastolic dysfunction).  - Aortic valve: There was no stenosis.  - Mitral valve: There was no significant regurgitation.  - Right ventricle: The cavity size was normal. Systolic function  was normal.  - Pulmonary arteries: No complete TR doppler jet so unable to  estimate PA systolic  pressure.  - Inferior vena cava: The vessel was normal in size. The  respirophasic diameter changes were in the normal range (= 50%),  consistent with normal central venous pressure.   Impressions:   - Normal LV size with mild diffuse hypokinesis, EF 50%. Moderate  diastolic dysfunction. Normal RV size and systolic function. No  TR jet obtained so unable to estimate PA systolic pressure.   Echocardiogram 2014: - Left ventricle: The cavity size was normal. Wall thickness  was normal. Systolic function was mildly reduced. The  estimated ejection fraction was in the range of 45% to  50%. Wall motion was normal; there were no regional wall  motion abnormalities.  - Left atrium: The atrium was mildly dilated.    Recent labs: 01/09/2020: Glucose 150, BUN/Cr 8/0.89. EGFR 81. Na/K 144/4.  Rest of the CMP normal H/H 12.0/36.5. MCV 85. Platelets 405 HbA1C 6.9% Chol 160, TG 108, HDL 43, LDL 97    Review of Systems  Cardiovascular: Positive for claudication. Negative for chest pain, dyspnea on exertion, leg swelling, palpitations and syncope.         Vitals:   02/26/20 1106  BP: 126/66  Pulse: 72  Temp: 98.1 F (36.7 C)  SpO2: 98%     Body mass index is 34.39 kg/m. Filed Weights   02/26/20 1106  Weight: 182 lb (82.6 kg)     Objective:   Physical Exam  Constitutional: She appears well-developed and well-nourished.  Neck: No JVD present.  Cardiovascular: Normal rate, regular rhythm, normal heart sounds and intact distal pulses.  No murmur heard. Pulses:      Popliteal pulses are 1+ on the right side and 2+ on the left side.       Dorsalis pedis pulses are 1+ on the right side and 1+ on the left side.       Posterior tibial pulses are 0 on the right side and 0 on the left side.  Pulmonary/Chest: Effort normal and breath sounds normal. She has no wheezes. She has no rales.  Musculoskeletal:        General: No edema.  Nursing note and vitals reviewed.         Assessment & Recommendations:   59 y.o. African-American female with hypertension, type 2 diabetes mellitus, hyperlipidemia, coronary artery disease, nonischemic cardiomyopathy with now recovered EF, pulmonary hypertension, referred for evaluation of abnormal ABI.  Abnormal ABI: Rt 0.68, Lt 0.78.  No signs of critical limb  ischemia.  Her symptoms of claudication are worse in left leg than right leg.  Recommend aggressive medical management at this time.  Switch aspirin to Plavix.  Continue blood pressure and diabetes control as per PCP.  Continue statin.  Recommend regular walking.  Unable to use Pletal given her history of congestive heart failure in the past.  Will check echocardiogram for baseline assessment with [place].  I will see her back in 3 months.  If symptoms not improved, could consider lower extremity duplex and revascularization.  Coronary artery disease: No angina symptoms at this time.  Continue medical therapy.  Pulmonary hypertension: Noted on right heart catheterization in 2014, thought to be out of proportion group 2 pulmonary hypertension.  Continue sildenafil, as she has done remarkably well on this medication.  Thank you for referring the patient to [place]. Please feel free to contact with any questions.  Nigel Mormon, MD [facility] [place] Pager: [phone] Office: [phone]

## 2020-02-26 ENCOUNTER — Encounter: Payer: Self-pay | Admitting: Cardiology

## 2020-02-26 ENCOUNTER — Other Ambulatory Visit: Payer: Self-pay

## 2020-02-26 ENCOUNTER — Ambulatory Visit: Payer: Medicare HMO | Admitting: Cardiology

## 2020-02-26 VITALS — BP 126/66 | HR 72 | Temp 98.1°F | Ht 61.0 in | Wt 182.0 lb

## 2020-02-26 DIAGNOSIS — I272 Pulmonary hypertension, unspecified: Secondary | ICD-10-CM

## 2020-02-26 DIAGNOSIS — I739 Peripheral vascular disease, unspecified: Secondary | ICD-10-CM

## 2020-02-26 DIAGNOSIS — I2510 Atherosclerotic heart disease of native coronary artery without angina pectoris: Secondary | ICD-10-CM | POA: Diagnosis not present

## 2020-02-26 DIAGNOSIS — I10 Essential (primary) hypertension: Secondary | ICD-10-CM

## 2020-02-26 MED ORDER — CLOPIDOGREL BISULFATE 75 MG PO TABS
75.0000 mg | ORAL_TABLET | Freq: Every day | ORAL | 3 refills | Status: DC
Start: 2020-02-26 — End: 2022-11-16

## 2020-02-26 NOTE — Patient Instructions (Signed)
Diet & Lifestyle recommendations:  Physical activity recommendation (The Physical Activity Guidelines for Americans. JAMA 2018;Nov 12) At least 150-300 minutes a week of moderate-intensity, or 75-150 minutes a week of vigorous-intensity aerobic physical activity, or an equivalent combination of moderate- and vigorous-intensity aerobic activity. Adults should perform muscle-strengthening activities on 2 or more days a week. Older adults should do multicomponent physical activity that includes balance training as well as aerobic and muscle-strengthening activities. Benefits of increased physical activity include lower risk of mortality including cardiovascular mortality, lower risk of cardiovascular events and associated risk factors (hypertension and diabetes), and lower risk of many cancers (including bladder, breast, colon, endometrium, esophagus, kidney, lung, and stomach). Additional improvments have been seen in cognition, risk of dementia, anxiety and depression, improved bone health, lower risk of falls, and associated injuries.  Dietary recommendation The 2019 ACC/AHA guidelines promote nutrition as a main fixture of cardiovascular wellness, with a recommendation for a varied diet of fruit, vegetables, fish, legumes, and whole grains (Class I), as well as recommendations to reduce sodium, cholesterol, processed meats, and refined sugars (Class IIa recommendation).10 Sodium intake, a topic of some controversy as of late, is recommended to be kept at 1,500 mg/day or less, far below the average daily intake in the [place] of 3,409 mg/day, and notably below that of previous [place] recommendations for 300mg /day.10,11 For those unable to reach 1,500 mg/day, they recommend at least a reduction of 1000 mg/day.  A [place]-Mediterranean Diet With Intermittent Fasting: JACC Review Topic of the Week. J Am Coll Cardiol 5974;16:3845-3646 Pes[place]-Mediterranean diet, it is supplemented with extra-virgin olive oil (EVOO),  which is the principle fat source, along with moderate amounts of dairy (particularly yogurt and cheese) and eggs, as well as modest amounts of alcohol consumption (ideally red wine with the evening meal), but few red and processed meats.

## 2020-03-09 ENCOUNTER — Ambulatory Visit: Payer: Medicare HMO

## 2020-03-09 ENCOUNTER — Other Ambulatory Visit: Payer: Self-pay

## 2020-03-09 DIAGNOSIS — I10 Essential (primary) hypertension: Secondary | ICD-10-CM | POA: Diagnosis not present

## 2020-04-13 DIAGNOSIS — N183 Chronic kidney disease, stage 3 unspecified: Secondary | ICD-10-CM | POA: Diagnosis not present

## 2020-04-13 DIAGNOSIS — N189 Chronic kidney disease, unspecified: Secondary | ICD-10-CM | POA: Diagnosis not present

## 2020-04-22 DIAGNOSIS — D631 Anemia in chronic kidney disease: Secondary | ICD-10-CM | POA: Diagnosis not present

## 2020-04-22 DIAGNOSIS — E669 Obesity, unspecified: Secondary | ICD-10-CM | POA: Diagnosis not present

## 2020-04-22 DIAGNOSIS — I129 Hypertensive chronic kidney disease with stage 1 through stage 4 chronic kidney disease, or unspecified chronic kidney disease: Secondary | ICD-10-CM | POA: Diagnosis not present

## 2020-04-22 DIAGNOSIS — I509 Heart failure, unspecified: Secondary | ICD-10-CM | POA: Diagnosis not present

## 2020-04-22 DIAGNOSIS — N183 Chronic kidney disease, stage 3 unspecified: Secondary | ICD-10-CM | POA: Diagnosis not present

## 2020-04-22 DIAGNOSIS — N2581 Secondary hyperparathyroidism of renal origin: Secondary | ICD-10-CM | POA: Diagnosis not present

## 2020-05-29 ENCOUNTER — Encounter: Payer: Self-pay | Admitting: Cardiology

## 2020-05-29 ENCOUNTER — Other Ambulatory Visit: Payer: Self-pay

## 2020-05-29 ENCOUNTER — Ambulatory Visit: Payer: Medicare HMO | Admitting: Cardiology

## 2020-05-29 VITALS — BP 204/90 | HR 74 | Ht 61.0 in | Wt 184.0 lb

## 2020-05-29 DIAGNOSIS — I10 Essential (primary) hypertension: Secondary | ICD-10-CM | POA: Diagnosis not present

## 2020-05-29 DIAGNOSIS — I2510 Atherosclerotic heart disease of native coronary artery without angina pectoris: Secondary | ICD-10-CM | POA: Diagnosis not present

## 2020-05-29 DIAGNOSIS — I739 Peripheral vascular disease, unspecified: Secondary | ICD-10-CM

## 2020-05-29 NOTE — Progress Notes (Signed)
Patient referred by Benito Mccreedy, MD for abnormal ABI  Subjective:   [facility], female    DOB: 1961/04/14, 59 y.o.   MRN: [id number]   Chief Complaint  Patient presents with  . PAD  . Hypertension    HPI  59 y/o African-American female with hypertension, type 2 diabetes mellitus, hyperlipidemia, coronary artery disease, PAD, nonischemic cardiomyopathy with now recovered EF, pulmonary hypertension  Patient denies chest pain, shortness of breath, palpitations, leg edema, orthopnea, PND, TIA/syncope. Claudication has improved. Blood pressure is very high today. She has not taken her medications.   Initial consultation HPI 02/26/2020: 59 y.o. African-American female with hypertension, type 2 diabetes mellitus, hyperlipidemia, coronary artery disease, nonischemic cardiomyopathy with now recovered EF, pulmonary hypertension, referred for evaluation of abnormal ABI.  Patient has been on disability since her heart failure episodes in 2014.  Prior to that, she is remote at [employer].  Currently, she lives fairly sedentary lifestyle, but does look after her grandchildren have been on event.  She denies any chest pain, shortness of breath, orthopnea, PND, leg edema symptoms.  She reports pain in both calves, right more than left, with walking 3 blocks.  She denies any rest pain, ulcers, nonhealing wounds.  Between 2011 2014, patient was diagnosed with nonischemic cardiomyopathy, with nonobstructive CAD on cath, pulmonary hypertension which was thought to be out of proportion to her cardiomyopathy.  She was started on sildenafil 20 mg 3 times daily by Dr. Algernon Huxley in 2014. Since then, her overall heart failure symptomatology has essentially resolved.  Her echocardiogram in 2016 showed normal EF.   Current Outpatient Medications on File Prior to Visit  Medication Sig Dispense Refill  . acetaminophen (TYLENOL) 325 MG tablet Take 325-650 mg by mouth every 6 (six) hours as needed for  mild pain (depends on pain).    Marland Kitchen albuterol (PROVENTIL HFA;VENTOLIN HFA) 108 (90 BASE) MCG/ACT inhaler Inhale 2 puffs into the lungs every 4 (four) hours as needed for wheezing or shortness of breath.    . carvedilol (COREG) 12.5 MG tablet TAKE 1 TABLET BY MOUTH 2 TIMES DAILY WITH A MEAL. 60 tablet 11  . Cholecalciferol (VITAMIN D PO) Take 1 tablet by mouth daily.    . clopidogrel (PLAVIX) 75 MG tablet Take 1 tablet (75 mg total) by mouth daily. 90 tablet 3  . ferrous sulfate 325 (65 FE) MG tablet Take 325 mg by mouth daily with breakfast.    . furosemide (LASIX) 40 MG tablet Take 1 tablet (40 mg total) by mouth daily. 30 tablet 11  . lisinopril (ZESTRIL) 10 MG tablet Take 20 mg by mouth daily.    . metFORMIN (GLUCOPHAGE) 850 MG tablet Take 1 tablet by mouth 2 (two) times daily.    . rosuvastatin (CRESTOR) 40 MG tablet Take 1 tablet (40 mg total) by mouth at bedtime. 30 tablet 11  . spironolactone (ALDACTONE) 25 MG tablet Take 25 mg by mouth daily.     No current facility-administered medications on file prior to visit.    Cardiovascular and other pertinent studies:   Echocardiogram 03/09/2020:  Normal LV systolic function with visual EF 55-60%. Left ventricle cavity  is normal in size. Moderate left ventricular hypertrophy. Normal global  wall motion. No obvious regional wall motion abnormalities. Doppler  evidence of grade I diastolic dysfunction, elevated LAP. Calculated EF  55%.  No significant valvular abnormalities.  IVC is normal with a respiratory response of <50%.  No significant change compared to prior study dated 11/2015  except Grade I  diastolic dysfunction is new.   EKG 02/26/2020: Sinus rhythm 74 bpm.  Old inferior infarct.   ABI 02/06/2020: Rt 0.68 Lt 0.88  LHC/RHC 06/2013: LM: No significant disease LAD: 50% mid LAD stenosis with distal tapering.  LCx: Moderate early OM1 with 50% ostial stenosis. Mid LCx with 40% stenosis.  RCA: Diffuse RCA disease with 70-80%  ostial stenosis and damping of the waveform; serial 50% stenoses in the mid and distal RCA.  Reviewed with Dr. Angelena Form.  70-80% stenosis with ostial damping in setting of relatively small caliber RCA.  The RCA is diffusely disease.  In the absence of ACS (no chest pain), would treat medically  Left ventriculography: Global hypokinesis with EF estimated at 35%.  Optimized PCWP on current dose of Lasix (16 mmHg).  Pulmonary arterial HTN with elevated PVR, not responsive to adenosine.  Given co-existing LV dysfunction, I am going to try her on sildenafil 20 mg tid.   She will followup in clinic in about 2 wks.   [place] 05/2013: Mean RA:  20  mmHg RV: 76/12 mmHg PA: 75/36 mmHg with a mean of 49 mmHg PCWP: 27 mmHg  Ao sat : 94% PA sat 50%   Fick Cardiac Output:  3.1 L/minute,  1.8 L/min minute INR INR ute/M2  PVR: 8.0 wood unites 567 dysnes-sec cm5  Impression:  PAD significantly higher than PCWP indicating some intrinsic pulmonary hypertension.  Increase diuretics and add nitrates Refer to CHF clinic and continue f/u with pulmonary ? [place]  Echocardiogram 11/2015: - Left ventricle: The cavity size was normal. Systolic function was  normal. The estimated ejection fraction was in the range of 55%  to 60%. Wall motion was normal; there were no regional wall  motion abnormalities. Left ventricular diastolic function  parameters were normal.  - Left atrium: The atrium was mildly dilated.   Impressions:   - Global LV longitudinal strain -18.3%.  Parameters of both systolic and diastolic LV function show  improvement compared with previous reports.   Echocardiogram 07/2015: - Left ventricle: The cavity size was normal. Wall thickness was  normal. The estimated ejection fraction was 50%. Diffuse  hypokinesis. Features are consistent with a pseudonormal left  ventricular filling pattern, with concomitant abnormal relaxation  and increased filling pressure (grade 2  diastolic dysfunction).  - Aortic valve: There was no stenosis.  - Mitral valve: There was no significant regurgitation.  - Right ventricle: The cavity size was normal. Systolic function  was normal.  - Pulmonary arteries: No complete TR doppler jet so unable to  estimate PA systolic pressure.  - Inferior vena cava: The vessel was normal in size. The  respirophasic diameter changes were in the normal range (= 50%),  consistent with normal central venous pressure.   Impressions:   - Normal LV size with mild diffuse hypokinesis, EF 50%. Moderate  diastolic dysfunction. Normal RV size and systolic function. No  TR jet obtained so unable to estimate PA systolic pressure.   Echocardiogram 2014: - Left ventricle: The cavity size was normal. Wall thickness  was normal. Systolic function was mildly reduced. The  estimated ejection fraction was in the range of 45% to  50%. Wall motion was normal; there were no regional wall  motion abnormalities.  - Left atrium: The atrium was mildly dilated.    Recent labs: 01/09/2020: Glucose 150, BUN/Cr 8/0.89. EGFR 81. Na/K 144/4.  Rest of the CMP normal H/H 12.0/36.5. MCV 85. Platelets 405 HbA1C 6.9% Chol 160, TG  108, HDL 43, LDL 97    Review of Systems  Cardiovascular: Positive for claudication. Negative for chest pain, dyspnea on exertion, leg swelling, palpitations and syncope.         Vitals:   05/29/20 1050 05/29/20 1053  BP: (!) 203/101 (!) 204/90  Pulse: 75 74  SpO2: 96%      Body mass index is 34.77 kg/m. Filed Weights   05/29/20 1050  Weight: 184 lb (83.5 kg)     Objective:    Physical Exam Vitals and nursing note reviewed.  Constitutional:      General: She is not in acute distress. Neck:     Vascular: No JVD.  Cardiovascular:     Rate and Rhythm: Normal rate and regular rhythm.     Pulses: Intact distal pulses.          Popliteal pulses are 1+ on the right side and 2+ on the left side.        Dorsalis pedis pulses are 1+ on the right side and 1+ on the left side.       Posterior tibial pulses are 0 on the right side and 0 on the left side.     Heart sounds: Normal heart sounds. No murmur heard.   Pulmonary:     Effort: Pulmonary effort is normal.     Breath sounds: Normal breath sounds. No wheezing or rales.            Assessment & Recommendations:   60 y/o African-American female with hypertension, type 2 diabetes mellitus, hyperlipidemia, coronary artery disease, PAD, nonischemic cardiomyopathy with now recovered EF, pulmonary hypertension  Hypertension: Uncontrolled. No hypertensive urgency. Increase lisinopril to 20 mg daily. Continue coreg 12.5 mg bid, spironolactone 25 mg daily. May need additional agent sin future. Recommend remote patient monitoring for BP checks F/u in 4 weeks  PAD: No critical limb ischemia.  Abnormal ABI Rt 0.68, Lt 0.78. Continue medical management, risk factor modification, regular walking. Currently on plavix.  Coronary artery disease: No angina symptoms at this time.  Continue medical therapy.  Pulmonary hypertension: Noted on right heart catheterization in 2014, thought to be out of proportion group 2 pulmonary hypertension.  Continue sildenafil, as she has done remarkably well on this medication.  F/u in 4 weeks  [place], MD [facility] [place] Pager: [phone] Office: [phone]

## 2020-06-04 DIAGNOSIS — J4520 Mild intermittent asthma, uncomplicated: Secondary | ICD-10-CM | POA: Diagnosis not present

## 2020-06-04 DIAGNOSIS — Z1321 Encounter for screening for nutritional disorder: Secondary | ICD-10-CM | POA: Diagnosis not present

## 2020-06-04 DIAGNOSIS — N1831 Chronic kidney disease, stage 3a: Secondary | ICD-10-CM | POA: Diagnosis not present

## 2020-06-04 DIAGNOSIS — I10 Essential (primary) hypertension: Secondary | ICD-10-CM | POA: Diagnosis not present

## 2020-06-04 DIAGNOSIS — Z0001 Encounter for general adult medical examination with abnormal findings: Secondary | ICD-10-CM | POA: Diagnosis not present

## 2020-06-04 DIAGNOSIS — E1165 Type 2 diabetes mellitus with hyperglycemia: Secondary | ICD-10-CM | POA: Diagnosis not present

## 2020-06-04 DIAGNOSIS — I5032 Chronic diastolic (congestive) heart failure: Secondary | ICD-10-CM | POA: Diagnosis not present

## 2020-06-04 DIAGNOSIS — E6609 Other obesity due to excess calories: Secondary | ICD-10-CM | POA: Diagnosis not present

## 2020-06-04 DIAGNOSIS — E7800 Pure hypercholesterolemia, unspecified: Secondary | ICD-10-CM | POA: Diagnosis not present

## 2020-06-17 DIAGNOSIS — I10 Essential (primary) hypertension: Secondary | ICD-10-CM | POA: Diagnosis not present

## 2020-06-30 NOTE — Progress Notes (Signed)
Patient referred by Benito Mccreedy, MD for abnormal ABI  Subjective:   [facility], female    DOB: 05-Jun-1961, 59 y.o.   MRN: [id number]   Chief Complaint  Patient presents with  . Hypertension    HPI  59 y/o African-American female with hypertension, type 2 diabetes mellitus, hyperlipidemia, coronary artery disease, PAD, nonischemic cardiomyopathy with now recovered EF, pulmonary hypertension  Patient denies chest pain, shortness of breath, palpitations, leg edema, orthopnea, PND, TIA/syncope. Claudication has improved. Blood pressure is very high today. She has not taken her medications.  In 05/2019, increased lisinopril to 20 mg daily, continued coreg 12.5 mg bid, spironolactone 25 mg daily. Blood pressure is now very well controlled. Claudication is not lifestyle limiting.   Initial consultation HPI 02/26/2020: 59 y.o. African-American female with hypertension, type 2 diabetes mellitus, hyperlipidemia, coronary artery disease, nonischemic cardiomyopathy with now recovered EF, pulmonary hypertension, referred for evaluation of abnormal ABI.  Patient has been on disability since her heart failure episodes in 2014.  Prior to that, she is remote at [employer].  Currently, she lives fairly sedentary lifestyle, but does look after her grandchildren have been on event.  She denies any chest pain, shortness of breath, orthopnea, PND, leg edema symptoms.  She reports pain in both calves, right more than left, with walking 3 blocks.  She denies any rest pain, ulcers, nonhealing wounds.  Between 2011 2014, patient was diagnosed with nonischemic cardiomyopathy, with nonobstructive CAD on cath, pulmonary hypertension which was thought to be out of proportion to her cardiomyopathy.  She was started on sildenafil 20 mg 3 times daily by Dr. Algernon Huxley in 2014. Since then, her overall heart failure symptomatology has essentially resolved.  Her echocardiogram in 2016 showed normal EF.   Current  Outpatient Medications on File Prior to Visit  Medication Sig Dispense Refill  . acetaminophen (TYLENOL) 325 MG tablet Take 325-650 mg by mouth every 6 (six) hours as needed for mild pain (depends on pain).    Marland Kitchen albuterol (PROVENTIL HFA;VENTOLIN HFA) 108 (90 BASE) MCG/ACT inhaler Inhale 2 puffs into the lungs every 4 (four) hours as needed for wheezing or shortness of breath.    . carvedilol (COREG) 12.5 MG tablet TAKE 1 TABLET BY MOUTH 2 TIMES DAILY WITH A MEAL. 60 tablet 11  . Cholecalciferol (VITAMIN D PO) Take 1 tablet by mouth daily.    . clopidogrel (PLAVIX) 75 MG tablet Take 1 tablet (75 mg total) by mouth daily. 90 tablet 3  . ferrous sulfate 325 (65 FE) MG tablet Take 325 mg by mouth daily with breakfast.    . furosemide (LASIX) 40 MG tablet Take 1 tablet (40 mg total) by mouth daily. 30 tablet 11  . lisinopril (ZESTRIL) 10 MG tablet Take 20 mg by mouth daily.    . metFORMIN (GLUCOPHAGE) 850 MG tablet Take 1 tablet by mouth 2 (two) times daily.    . rosuvastatin (CRESTOR) 40 MG tablet Take 1 tablet (40 mg total) by mouth at bedtime. 30 tablet 11  . spironolactone (ALDACTONE) 25 MG tablet Take 25 mg by mouth daily.     No current facility-administered medications on file prior to visit.    Cardiovascular and other pertinent studies:   Echocardiogram 03/09/2020:  Normal LV systolic function with visual EF 55-60%. Left ventricle cavity  is normal in size. Moderate left ventricular hypertrophy. Normal global  wall motion. No obvious regional wall motion abnormalities. Doppler  evidence of grade I diastolic dysfunction, elevated LAP. Calculated EF  55%.  No significant valvular abnormalities.  IVC is normal with a respiratory response of <50%.  No significant change compared to prior study dated 11/2015 except Grade I  diastolic dysfunction is new.   EKG 02/26/2020: Sinus rhythm 74 bpm.  Old inferior infarct.   ABI 02/06/2020: Rt 0.68 Lt 0.88  LHC/RHC 06/2013: LM: No  significant disease LAD: 50% mid LAD stenosis with distal tapering.  LCx: Moderate early OM1 with 50% ostial stenosis. Mid LCx with 40% stenosis.  RCA: Diffuse RCA disease with 70-80% ostial stenosis and damping of the waveform; serial 50% stenoses in the mid and distal RCA.  Reviewed with Dr. Angelena Form.  70-80% stenosis with ostial damping in setting of relatively small caliber RCA.  The RCA is diffusely disease.  In the absence of ACS (no chest pain), would treat medically  Left ventriculography: Global hypokinesis with EF estimated at 35%.  Optimized PCWP on current dose of Lasix (16 mmHg).  Pulmonary arterial HTN with elevated PVR, not responsive to adenosine.  Given co-existing LV dysfunction, I am going to try her on sildenafil 20 mg tid.   She will followup in clinic in about 2 wks.   [place] 05/2013: Mean RA:  20  mmHg RV: 76/12 mmHg PA: 75/36 mmHg with a mean of 49 mmHg PCWP: 27 mmHg  Ao sat : 94% PA sat 50%   Fick Cardiac Output:  3.1 L/minute,  1.8 L/min minute INR INR ute/M2  PVR: 8.0 wood unites 567 dysnes-sec cm5  Impression:  PAD significantly higher than PCWP indicating some intrinsic pulmonary hypertension.  Increase diuretics and add nitrates Refer to CHF clinic and continue f/u with pulmonary ? [place]  Echocardiogram 11/2015: - Left ventricle: The cavity size was normal. Systolic function was  normal. The estimated ejection fraction was in the range of 55%  to 60%. Wall motion was normal; there were no regional wall  motion abnormalities. Left ventricular diastolic function  parameters were normal.  - Left atrium: The atrium was mildly dilated.   Impressions:   - Global LV longitudinal strain -18.3%.  Parameters of both systolic and diastolic LV function show  improvement compared with previous reports.   Echocardiogram 07/2015: - Left ventricle: The cavity size was normal. Wall thickness was  normal. The estimated ejection fraction was 50%.  Diffuse  hypokinesis. Features are consistent with a pseudonormal left  ventricular filling pattern, with concomitant abnormal relaxation  and increased filling pressure (grade 2 diastolic dysfunction).  - Aortic valve: There was no stenosis.  - Mitral valve: There was no significant regurgitation.  - Right ventricle: The cavity size was normal. Systolic function  was normal.  - Pulmonary arteries: No complete TR doppler jet so unable to  estimate PA systolic pressure.  - Inferior vena cava: The vessel was normal in size. The  respirophasic diameter changes were in the normal range (= 50%),  consistent with normal central venous pressure.   Impressions:   - Normal LV size with mild diffuse hypokinesis, EF 50%. Moderate  diastolic dysfunction. Normal RV size and systolic function. No  TR jet obtained so unable to estimate PA systolic pressure.   Echocardiogram 2014: - Left ventricle: The cavity size was normal. Wall thickness  was normal. Systolic function was mildly reduced. The  estimated ejection fraction was in the range of 45% to  50%. Wall motion was normal; there were no regional wall  motion abnormalities.  - Left atrium: The atrium was mildly dilated.    Recent labs:  01/09/2020: Glucose 150, BUN/Cr 8/0.89. EGFR 81. Na/K 144/4.  Rest of the CMP normal H/H 12.0/36.5. MCV 85. Platelets 405 HbA1C 6.9% Chol 160, TG 108, HDL 43, LDL 97    Review of Systems  Cardiovascular: Positive for claudication. Negative for chest pain, dyspnea on exertion, leg swelling, palpitations and syncope.         Vitals:   07/01/20 1100  BP: 134/76  Pulse: 70  SpO2: 92%    Body mass index is 34.77 kg/m. Filed Weights   07/01/20 1100  Weight: 184 lb (83.5 kg)     Objective:    Physical Exam Vitals and nursing note reviewed.  Constitutional:      General: She is not in acute distress. Neck:     Vascular: No JVD.  Cardiovascular:     Rate and  Rhythm: Normal rate and regular rhythm.     Pulses: Intact distal pulses.          Popliteal pulses are 1+ on the right side and 2+ on the left side.       Dorsalis pedis pulses are 1+ on the right side and 1+ on the left side.       Posterior tibial pulses are 0 on the right side and 0 on the left side.     Heart sounds: Normal heart sounds. No murmur heard.   Pulmonary:     Effort: Pulmonary effort is normal.     Breath sounds: Normal breath sounds. No wheezing or rales.            Assessment & Recommendations:   59 y/o African-American female with hypertension, type 2 diabetes mellitus, hyperlipidemia, coronary artery disease, PAD, nonischemic cardiomyopathy with now recovered EF, pulmonary hypertension  Hypertension: Currently on lisinopril to 20 mg daily, coreg 12.5 mg bid, spironolactone 25 mg daily.  Blood pressure now very well controlled.   PAD: No critical limb ischemia.  Abnormal ABI Rt 0.68, Lt 0.78. Continue medical management, risk factor modification, regular walking. Currently on plavix.  Coronary artery disease: No angina symptoms at this time.  Continue medical therapy.  Pulmonary hypertension: Noted on right heart catheterization in 2014, thought to be out of proportion group 2 pulmonary hypertension.  Continue sildenafil, as she has done remarkably well on this medication.  F/u in 6 months  Aja Bolander Esther Hardy, MD [facility] Pager: [phone] Office: [phone]

## 2020-07-01 ENCOUNTER — Encounter: Payer: Self-pay | Admitting: Cardiology

## 2020-07-01 ENCOUNTER — Ambulatory Visit: Payer: Medicare HMO | Admitting: Cardiology

## 2020-07-01 ENCOUNTER — Other Ambulatory Visit: Payer: Self-pay

## 2020-07-01 VITALS — BP 134/76 | HR 70 | Ht 61.0 in | Wt 184.0 lb

## 2020-07-01 DIAGNOSIS — H2513 Age-related nuclear cataract, bilateral: Secondary | ICD-10-CM | POA: Diagnosis not present

## 2020-07-01 DIAGNOSIS — I10 Essential (primary) hypertension: Secondary | ICD-10-CM

## 2020-07-01 DIAGNOSIS — H25013 Cortical age-related cataract, bilateral: Secondary | ICD-10-CM | POA: Diagnosis not present

## 2020-07-01 DIAGNOSIS — I2510 Atherosclerotic heart disease of native coronary artery without angina pectoris: Secondary | ICD-10-CM | POA: Diagnosis not present

## 2020-07-01 DIAGNOSIS — I272 Pulmonary hypertension, unspecified: Secondary | ICD-10-CM | POA: Diagnosis not present

## 2020-07-01 DIAGNOSIS — I739 Peripheral vascular disease, unspecified: Secondary | ICD-10-CM

## 2020-07-01 DIAGNOSIS — E119 Type 2 diabetes mellitus without complications: Secondary | ICD-10-CM | POA: Diagnosis not present

## 2020-07-01 DIAGNOSIS — H18603 Keratoconus, unspecified, bilateral: Secondary | ICD-10-CM | POA: Diagnosis not present

## 2020-07-18 DIAGNOSIS — I10 Essential (primary) hypertension: Secondary | ICD-10-CM | POA: Diagnosis not present

## 2020-08-18 DIAGNOSIS — I10 Essential (primary) hypertension: Secondary | ICD-10-CM | POA: Diagnosis not present

## 2020-09-17 DIAGNOSIS — I10 Essential (primary) hypertension: Secondary | ICD-10-CM | POA: Diagnosis not present

## 2020-10-06 DIAGNOSIS — N1831 Chronic kidney disease, stage 3a: Secondary | ICD-10-CM | POA: Diagnosis not present

## 2020-10-06 DIAGNOSIS — E1165 Type 2 diabetes mellitus with hyperglycemia: Secondary | ICD-10-CM | POA: Diagnosis not present

## 2020-10-06 DIAGNOSIS — J4520 Mild intermittent asthma, uncomplicated: Secondary | ICD-10-CM | POA: Diagnosis not present

## 2020-10-06 DIAGNOSIS — E6609 Other obesity due to excess calories: Secondary | ICD-10-CM | POA: Diagnosis not present

## 2020-10-06 DIAGNOSIS — E7800 Pure hypercholesterolemia, unspecified: Secondary | ICD-10-CM | POA: Diagnosis not present

## 2020-10-06 DIAGNOSIS — I10 Essential (primary) hypertension: Secondary | ICD-10-CM | POA: Diagnosis not present

## 2020-10-06 DIAGNOSIS — I5032 Chronic diastolic (congestive) heart failure: Secondary | ICD-10-CM | POA: Diagnosis not present

## 2020-10-18 DIAGNOSIS — I10 Essential (primary) hypertension: Secondary | ICD-10-CM | POA: Diagnosis not present

## 2020-11-17 DIAGNOSIS — I10 Essential (primary) hypertension: Secondary | ICD-10-CM | POA: Diagnosis not present

## 2020-11-20 ENCOUNTER — Emergency Department (HOSPITAL_COMMUNITY)
Admission: EM | Admit: 2020-11-20 | Discharge: 2020-11-21 | Disposition: A | Discharge status: Home / Self Care | Payer: Medicare HMO | Attending: Emergency Medicine | Admitting: Emergency Medicine

## 2020-11-20 ENCOUNTER — Encounter (HOSPITAL_COMMUNITY): Payer: Self-pay

## 2020-11-20 ENCOUNTER — Emergency Department (HOSPITAL_COMMUNITY): Payer: Medicare HMO

## 2020-11-20 ENCOUNTER — Other Ambulatory Visit: Payer: Self-pay

## 2020-11-20 DIAGNOSIS — I2510 Atherosclerotic heart disease of native coronary artery without angina pectoris: Secondary | ICD-10-CM | POA: Diagnosis not present

## 2020-11-20 DIAGNOSIS — R112 Nausea with vomiting, unspecified: Secondary | ICD-10-CM | POA: Diagnosis not present

## 2020-11-20 DIAGNOSIS — Z79899 Other long term (current) drug therapy: Secondary | ICD-10-CM | POA: Insufficient documentation

## 2020-11-20 DIAGNOSIS — I110 Hypertensive heart disease with heart failure: Secondary | ICD-10-CM | POA: Insufficient documentation

## 2020-11-20 DIAGNOSIS — R42 Dizziness and giddiness: Secondary | ICD-10-CM | POA: Diagnosis not present

## 2020-11-20 DIAGNOSIS — R197 Diarrhea, unspecified: Secondary | ICD-10-CM | POA: Insufficient documentation

## 2020-11-20 DIAGNOSIS — R1013 Epigastric pain: Secondary | ICD-10-CM | POA: Diagnosis not present

## 2020-11-20 DIAGNOSIS — R001 Bradycardia, unspecified: Secondary | ICD-10-CM | POA: Diagnosis not present

## 2020-11-20 DIAGNOSIS — R109 Unspecified abdominal pain: Secondary | ICD-10-CM

## 2020-11-20 DIAGNOSIS — Z7901 Long term (current) use of anticoagulants: Secondary | ICD-10-CM | POA: Insufficient documentation

## 2020-11-20 DIAGNOSIS — Z7984 Long term (current) use of oral hypoglycemic drugs: Secondary | ICD-10-CM | POA: Insufficient documentation

## 2020-11-20 DIAGNOSIS — K219 Gastro-esophageal reflux disease without esophagitis: Secondary | ICD-10-CM | POA: Diagnosis not present

## 2020-11-20 DIAGNOSIS — R739 Hyperglycemia, unspecified: Secondary | ICD-10-CM

## 2020-11-20 DIAGNOSIS — E119 Type 2 diabetes mellitus without complications: Secondary | ICD-10-CM | POA: Diagnosis not present

## 2020-11-20 DIAGNOSIS — E1165 Type 2 diabetes mellitus with hyperglycemia: Secondary | ICD-10-CM | POA: Insufficient documentation

## 2020-11-20 DIAGNOSIS — I5043 Acute on chronic combined systolic (congestive) and diastolic (congestive) heart failure: Secondary | ICD-10-CM | POA: Insufficient documentation

## 2020-11-20 DIAGNOSIS — R1084 Generalized abdominal pain: Secondary | ICD-10-CM | POA: Diagnosis not present

## 2020-11-20 LAB — LIPASE, BLOOD: Lipase: 26 U/L (ref 11–51)

## 2020-11-20 LAB — COMPREHENSIVE METABOLIC PANEL WITH GFR
ALT: 26 U/L (ref 0–44)
AST: 36 U/L (ref 15–41)
Albumin: 4 g/dL (ref 3.5–5.0)
Alkaline Phosphatase: 92 U/L (ref 38–126)
Anion gap: 15 (ref 5–15)
BUN: 10 mg/dL (ref 8–23)
CO2: 21 mmol/L — ABNORMAL LOW (ref 22–32)
Calcium: 9.9 mg/dL (ref 8.9–10.3)
Chloride: 102 mmol/L (ref 98–111)
Creatinine, Ser: 1.15 mg/dL — ABNORMAL HIGH (ref 0.44–1.00)
GFR, Estimated: 54 mL/min — ABNORMAL LOW
Glucose, Bld: 413 mg/dL — ABNORMAL HIGH (ref 70–99)
Potassium: 3.8 mmol/L (ref 3.5–5.1)
Sodium: 138 mmol/L (ref 135–145)
Total Bilirubin: 1.6 mg/dL — ABNORMAL HIGH (ref 0.3–1.2)
Total Protein: 7.4 g/dL (ref 6.5–8.1)

## 2020-11-20 LAB — CBC
HCT: 40.1 % (ref 36.0–46.0)
Hemoglobin: 13.1 g/dL (ref 12.0–15.0)
MCH: 28.1 pg (ref 26.0–34.0)
MCHC: 32.7 g/dL (ref 30.0–36.0)
MCV: 86.1 fL (ref 80.0–100.0)
Platelets: 380 10*3/uL (ref 150–400)
RBC: 4.66 MIL/uL (ref 3.87–5.11)
RDW: 12.8 % (ref 11.5–15.5)
WBC: 16.3 10*3/uL — ABNORMAL HIGH (ref 4.0–10.5)
nRBC: 0 % (ref 0.0–0.2)

## 2020-11-20 LAB — CBG MONITORING, ED: Glucose-Capillary: 411 mg/dL — ABNORMAL HIGH (ref 70–99)

## 2020-11-20 MED ORDER — ONDANSETRON HCL 4 MG/2ML IJ SOLN
4.0000 mg | Freq: Once | INTRAMUSCULAR | Status: AC
  Administered 2020-11-20: 4 mg via INTRAVENOUS
  Filled 2020-11-20: qty 2

## 2020-11-20 MED ORDER — LORAZEPAM 2 MG/ML IJ SOLN
1.0000 mg | Freq: Once | INTRAMUSCULAR | Status: AC
  Administered 2020-11-21: 1 mg via INTRAVENOUS
  Filled 2020-11-20: qty 1

## 2020-11-20 NOTE — ED Triage Notes (Signed)
Pt reports emesis, dizziness and slight abd pain that started earlier today.

## 2020-11-20 NOTE — ED Provider Notes (Signed)
[facility] EMERGENCY DEPARTMENT Provider Note   CSN: [id number] Arrival date & time: 11/20/20  1840     History Chief Complaint  Patient presents with  . Abdominal Pain  . Emesis  . Dizziness    [name] is a 59 y.o. female with a hx of asthma, hypertension, hypercholesterolemia, diabetes mellitus, CHF last EF 55% GERD, and CAD who presents to the ED with complaints of vomiting that began @ 0900 AM. Patient reports she began to feel nauseated with dizziness and subsequent vomiting. Has had > 10 episodes of non bloody emesis with intermittent dizziness like the room spinning and sensation of being about to pass out, she feels very weak. She reports intermittent abdominal pain in the epigastrium described as burning/sharp just prior to emesis, lasts a few seconds, then resolves. Last BM was around 1800, loose non bloody stool. Denies fever, chills, syncope, focal numbness/weakness, speech abnormalities, melena, hematochezia, hematemesis, dysuria, or vaginal bleeding/discharge.   HPI     Past Medical History:  Diagnosis Date  . Asthma    prn - occasional uses inhaler  . Chronic systolic CHF (congestive heart failure) ([place])   . Diabetes mellitus without complication (HCC)    borderline, being monitored, no meds  . Hypercholesterolemia   . Hypertension   . Microcytic anemia   . Nonischemic cardiomyopathy ([place])    a. 2005 Cath: nonobstructive dzs;  b. 04/2013 Echo: EF 3035%, diff HK, restrictive physiology, mildly dil LA, PASP 59mmHg.  . SVD (spontaneous vaginal delivery)    x 3    Patient Active Problem List   Diagnosis Date Noted  . PAD (peripheral artery disease) ([place]) 02/26/2020  . S/P hysterectomy 01/15/2015  . Coronary artery disease involving native coronary artery of native heart without angina pectoris 07/29/2013  . Other sleep disturbances 07/02/2013  . Chronic systolic heart failure ([place]) 06/25/2013  . GERD (gastroesophageal reflux disease)  06/18/2013  . Pulmonary hypertension, unspecified ([place]) 06/17/2013  . Ascites 06/07/2013  . Acute systolic CHF (congestive heart failure) ([place]) 04/19/2013  . Acute on chronic systolic CHF (congestive heart failure) ([place]) 04/19/2013  . Elevated troponin 04/19/2013  . Nonischemic cardiomyopathy ([place])   . Essential hypertension   . Hypercholesterolemia   . Microcytic anemia     Past Surgical History:  Procedure Laterality Date  . BILATERAL SALPINGECTOMY Bilateral 01/15/2015   Procedure: BILATERAL SALPINGECTOMY;  Surgeon: Marvene Staff, MD;  Location: [facility];  Service: Gynecology;  Laterality: Bilateral;  . CORONARY ANGIOPLASTY  2005  . LEFT AND RIGHT HEART CATHETERIZATION WITH CORONARY ANGIOGRAM N/A 07/05/2013   Procedure: LEFT AND RIGHT HEART CATHETERIZATION WITH CORONARY ANGIOGRAM;  Surgeon: Larey Dresser, MD;  Location: [facility] CATH LAB;  Service: Cardiovascular;  Laterality: N/A;  . RIGHT HEART CATHETERIZATION N/A 06/13/2013   Procedure: RIGHT HEART CATH;  Surgeon: Josue Hector, MD;  Location: [facility] CATH LAB;  Service: Cardiovascular;  Laterality: N/A;  . ROBOTIC ASSISTED TOTAL HYSTERECTOMY N/A 01/15/2015   Procedure: ROBOTIC ASSISTED TOTAL HYSTERECTOMY;  Surgeon: Marvene Staff, MD;  Location: [facility];  Service: Gynecology;  Laterality: N/A;  . TUBAL LIGATION    . WISDOM TOOTH EXTRACTION       OB History   No obstetric history on file.     Family History  Problem Relation Age of Onset  . Hypertension Sister   . Hyperlipidemia Sister   . Heart disease Brother        History of heart transplant  . Hypertension Brother   .  Hypertension Sister   . Hyperlipidemia Sister   . Heart disease Brother        Coronary artery disease  . Hypertension Brother   . Heart disease Brother   . Hypertension Brother   . Heart disease Brother   . Hypertension Brother     Social History   Tobacco Use  . Smoking status: Never Smoker  . Smokeless tobacco: Never Used  Vaping Use  .  Vaping Use: Never used  Substance Use Topics  . Alcohol use: No  . Drug use: No    Home Medications Prior to Admission medications   Medication Sig Start Date End Date Taking? Authorizing Provider  acetaminophen (TYLENOL) 325 MG tablet Take 325-650 mg by mouth every 6 (six) hours as needed for mild pain (depends on pain).   Yes [provider]  albuterol (PROVENTIL HFA;VENTOLIN HFA) 108 (90 BASE) MCG/ACT inhaler Inhale 2 puffs into the lungs every 4 (four) hours as needed for wheezing or shortness of breath. 04/08/13  Yes Merrell, Hannah, PA-C  carvedilol (COREG) 12.5 MG tablet TAKE 1 TABLET BY MOUTH 2 TIMES DAILY WITH A MEAL. Patient taking differently: Take 12.5 mg by mouth 2 (two) times daily with a meal.  02/13/17  Yes Bensimhon, Shaune Pascal, MD  Cholecalciferol (VITAMIN D PO) Take 125 mcg by mouth in the morning and at bedtime.    Yes [provider]  clopidogrel (PLAVIX) 75 MG tablet Take 1 tablet (75 mg total) by mouth daily. 02/26/20  Yes Patwardhan, Manish J, MD  ferrous sulfate 325 (65 FE) MG tablet Take 325 mg by mouth daily with breakfast.   Yes [provider]  furosemide (LASIX) 40 MG tablet Take 1 tablet (40 mg total) by mouth daily. Patient taking differently: Take 20 mg by mouth 2 (two) times daily.  02/13/17  Yes Bensimhon, Shaune Pascal, MD  lisinopril (ZESTRIL) 10 MG tablet Take 10 mg by mouth daily.    Yes [provider]  metFORMIN (GLUCOPHAGE) 850 MG tablet Take 850 mg by mouth 2 (two) times daily.  02/06/20  Yes [provider]  rosuvastatin (CRESTOR) 40 MG tablet Take 1 tablet (40 mg total) by mouth at bedtime. 02/13/17  Yes Bensimhon, Shaune Pascal, MD  spironolactone (ALDACTONE) 25 MG tablet Take 25 mg by mouth daily.   Yes [provider]    Allergies    Patient has no known allergies.  Review of Systems   Review of Systems  Constitutional: Negative for chills and fever.  Eyes: Negative for visual disturbance.  Respiratory:  Negative for cough.   Gastrointestinal: Positive for abdominal pain, diarrhea, nausea and vomiting. Negative for blood in stool and constipation.  Genitourinary: Negative for dysuria, vaginal bleeding and vaginal discharge.  Neurological: Positive for dizziness and weakness (generalized no focal). Negative for syncope, numbness and headaches.  All other systems reviewed and are negative.   Physical Exam Updated Vital Signs BP (!) 185/56 (BP Location: Right Arm)   Pulse (!) 56   Temp 98 F (36.7 C) (Oral)   Resp 15   Ht 5' 1.5" (1.562 m)   Wt 82.6 kg   LMP 01/01/2015 (Approximate)   SpO2 94%   BMI 33.83 kg/m   Physical Exam Vitals and nursing note reviewed.  Constitutional:      General: She is not in acute distress.    Appearance: She is well-developed. She is not toxic-appearing.  HENT:     Head: Normocephalic and atraumatic.  Eyes:  General:        Right eye: No discharge.        Left eye: No discharge.     Extraocular Movements: Extraocular movements intact.     Conjunctiva/sclera: Conjunctivae normal.     Pupils: Pupils are equal, round, and reactive to light.  Cardiovascular:     Rate and Rhythm: Normal rate and regular rhythm.  Pulmonary:     Effort: Pulmonary effort is normal. No respiratory distress.     Breath sounds: Normal breath sounds. No wheezing, rhonchi or rales.  Abdominal:     General: There is no distension.     Palpations: Abdomen is soft.     Tenderness: There is abdominal tenderness in the right upper quadrant, right lower quadrant, epigastric area and periumbilical area. There is no guarding or rebound.  Musculoskeletal:     Cervical back: Neck supple.  Skin:    General: Skin is warm and dry.     Findings: No rash.  Neurological:     Mental Status: She is alert.     Comments: Clear speech. CN III-XII Grossly intact. Sensation grossly intact to bilateral upper/lower extremities. 5/5 symmetric grip strength & strength with  plantar/dorsiflexion bilaterally. Normal finger to nose. No pronator drift.   Psychiatric:        Behavior: Behavior normal.    ED Results / Procedures / Treatments   Labs (all labs ordered are listed, but only abnormal results are displayed) Labs Reviewed  CBC - Abnormal; Notable for the following components:      Result Value   WBC 16.3 (*)    All other components within normal limits  COMPREHENSIVE METABOLIC PANEL - Abnormal; Notable for the following components:   CO2 21 (*)    Glucose, Bld 413 (*)    Creatinine, Ser 1.15 (*)    Total Bilirubin 1.6 (*)    GFR, Estimated 54 (*)    All other components within normal limits  CBG MONITORING, ED - Abnormal; Notable for the following components:   Glucose-Capillary 411 (*)    All other components within normal limits  LIPASE, BLOOD    EKG EKG Interpretation  Date/Time:  Friday November 20 2020 18:51:35 EST Ventricular Rate:  58 PR Interval:  200 QRS Duration: 86 QT Interval:  428 QTC Calculation: 420 R Axis:   -25 Text Interpretation: Sinus bradycardia Minimal voltage criteria for LVH, may be normal variant ( R in aVL ) Inferior infarct , age undetermined Possible Anterior infarct , age undetermined Abnormal ECG When compared with ECG of 01/13/2015, No significant change was found Confirmed by Delora Fuel ([id number]) on 11/21/2020 1:07:29 AM   Radiology CT HEAD WO CONTRAST  Result Date: 11/20/2020 CLINICAL DATA:  Dizziness. EXAM: CT HEAD WITHOUT CONTRAST TECHNIQUE: Contiguous axial images were obtained from the base of the skull through the vertex without intravenous contrast. COMPARISON:  None. FINDINGS: Brain: No evidence of acute infarction, hemorrhage, hydrocephalus, extra-axial collection or mass lesion/mass effect. Vascular: No hyperdense vessel or unexpected calcification. Skull: Normal. Negative for fracture or focal lesion. Sinuses/Orbits: No acute finding. Other: None. IMPRESSION: Normal.  No cause for dizziness  identified. Electronically Signed   By: Dorise Bullion III M.D   On: 11/20/2020 19:57   CT ABDOMEN PELVIS W CONTRAST  Result Date: 11/21/2020 CLINICAL DATA:  Abdominal pain EXAM: CT ABDOMEN AND PELVIS WITH CONTRAST TECHNIQUE: Multidetector CT imaging of the abdomen and pelvis was performed using the standard protocol following bolus administration of intravenous contrast. CONTRAST:  156mL OMNIPAQUE IOHEXOL 300 MG/ML  SOLN COMPARISON:  None. FINDINGS: Lower chest: The visualized heart size within normal limits. No pericardial fluid/thickening. No hiatal hernia. The visualized portions of the lungs are clear. Hepatobiliary: The liver is normal in density without focal abnormality.The main portal vein is patent. No evidence of calcified gallstones, gallbladder wall thickening or biliary dilatation. Pancreas: Unremarkable. No pancreatic ductal dilatation or surrounding inflammatory changes. Spleen: Normal in size without focal abnormality. Adrenals/Urinary Tract: Both adrenal glands appear normal. There is a 8 mm calculus seen within the midpole of the right kidney. No hydronephrosis is seen. Bladder is unremarkable. Stomach/Bowel: The stomach, small bowel, and colon are normal in appearance. No inflammatory changes, wall thickening, or obstructive findings.The appendix is normal. Vascular/Lymphatic: There are no enlarged mesenteric, retroperitoneal, or pelvic lymph nodes. Scattered aortic atherosclerotic calcifications are seen without aneurysmal dilatation. Reproductive: The patient is status post hysterectomy. No adnexal masses or collections seen. Other: No evidence of abdominal wall mass or hernia. Musculoskeletal: No acute or significant osseous findings. IMPRESSION: Diverticulosis without diverticulitis. Nonobstructing 8 mm right renal calculus. Electronically Signed   By: Prudencio Pair M.D.   On: 11/21/2020 03:19    Procedures Procedures (including critical care time)  Medications Ordered in  ED Medications  ondansetron (ZOFRAN) injection 4 mg (4 mg Intravenous Given 11/20/20 1903)  LORazepam (ATIVAN) injection 1 mg (1 mg Intravenous Given 11/21/20 0158)  sodium chloride 0.9 % bolus 500 mL (0 mLs Intravenous Stopped 11/21/20 0454)  droperidol (INAPSINE) 2.5 MG/ML injection 2.5 mg (2.5 mg Intravenous Given 11/21/20 0158)  iohexol (OMNIPAQUE) 300 MG/ML solution 100 mL (100 mLs Intravenous Contrast Given 11/21/20 0303)    ED Course  I have reviewed the triage vital signs and the nursing notes.  Pertinent labs & imaging results that were available during my care of the patient were reviewed by me and considered in my medical decision making (see chart for details).    MDM Rules/Calculators/A&P                         Patient presents to the ED with complaints of N/V & dizziness as well as abdominal pain.  Nontoxic, vitals w/ elevated BP- otherwise fairly unremarkable. No focal neuro deficits. Dizziness is reproduced by passive head movement, but there is no nystagmus seen.  Abdomen with right sided and central abdominal tenderness w/o peritoneal signs.   Additional history obtained:  Additional history obtained from chart review & nursing note review.   EKG: No significant change compared to last tracing.   Lab Tests:  I reviewed and interpreted labs, which included:  CBC: Leukocytosis @ 16.3.  CMP: Hyperglycemia with mildly low bicarb @ 21, no anion gap elevation, renal function similar to prior.  Lipase: WNL  Imaging Studies ordered:  CT head was ordered per triage protocol, I ordered imaging studies which included CT A/P, I independently visualized and interpreted imaging which showed:  CT head: Normal.  No cause for dizziness identified.  CT A/p: Diverticulosis without diverticulitis. Nonobstructing 8 mm right renal calculus. ED Course:   Patient's blood work notable for hyperglycemia, but findings not consistent with DKA.Her imaging is overall reassuring.  Her CT head  does not show signs of bleeding.  Her dizziness seems consistent w/ peripheral vertigo- improved S/p droperidol, remains without focal neuro deficits does not seem consistent w/ central process. Her CT A/P does not show findings of acute surgical process, no appendicitis, perforation, obstruction, intra-abdominal abscess, or acute  cholecystitis.  Patient is feeling improved status post interventions in the emergency department.  She feels ready to go home.  Unclear definitive etiology to her symptoms, will discharge home with supportive care and PCP follow-up. I discussed results, treatment plan, need for follow-up, and return precautions with the patient. Provided opportunity for questions, patient confirmed understanding and is in agreement with plan.   This is a shared visit with supervising physician Dr. Roxanne Mins who has independently evaluated patient & provided guidance in evaluation/management/disposition, in agreement with care   Portions of this note were generated with Dragon dictation software. Dictation errors may occur despite best attempts at proofreading.  Final Clinical Impression(s) / ED Diagnoses Final diagnoses:  Nausea vomiting and diarrhea  Abdominal pain, unspecified abdominal location  Hyperglycemia    Rx / DC Orders ED Discharge Orders         Ordered    ondansetron (ZOFRAN ODT) 4 MG disintegrating tablet  Every 8 hours PRN        11/21/20 0730    pantoprazole (PROTONIX) 40 MG tablet  Daily        11/21/20 0730    sucralfate (CARAFATE) 1 GM/10ML suspension  3 times daily with meals & bedtime        11/21/20 0730    meclizine (ANTIVERT) 25 MG tablet  3 times daily PRN        11/21/20 0733           Jamerius Boeckman, Glynda Jaeger, PA-C 11/21/20 [place]    Varney Biles, MD 11/21/20 [phone]

## 2020-11-21 ENCOUNTER — Emergency Department (HOSPITAL_COMMUNITY): Payer: Medicare HMO

## 2020-11-21 DIAGNOSIS — R109 Unspecified abdominal pain: Secondary | ICD-10-CM | POA: Diagnosis not present

## 2020-11-21 DIAGNOSIS — R1013 Epigastric pain: Secondary | ICD-10-CM | POA: Diagnosis not present

## 2020-11-21 MED ORDER — DROPERIDOL 2.5 MG/ML IJ SOLN
2.5000 mg | Freq: Once | INTRAMUSCULAR | Status: AC
  Administered 2020-11-21: 2.5 mg via INTRAVENOUS
  Filled 2020-11-21: qty 2

## 2020-11-21 MED ORDER — SODIUM CHLORIDE 0.9 % IV BOLUS
500.0000 mL | Freq: Once | INTRAVENOUS | Status: AC
  Administered 2020-11-21: 500 mL via INTRAVENOUS

## 2020-11-21 MED ORDER — SUCRALFATE 1 GM/10ML PO SUSP: 1.0000 g | Freq: Three times a day (TID) | ORAL | 0 refills | Status: DC

## 2020-11-21 MED ORDER — ONDANSETRON 4 MG PO TBDP: 4.0000 mg | ORAL_TABLET | Freq: Three times a day (TID) | ORAL | 0 refills | Status: DC | PRN

## 2020-11-21 MED ORDER — MECLIZINE HCL 25 MG PO TABS: 25.0000 mg | ORAL_TABLET | Freq: Three times a day (TID) | ORAL | 0 refills | Status: DC | PRN

## 2020-11-21 MED ORDER — IOHEXOL 300 MG/ML  SOLN
100.0000 mL | Freq: Once | INTRAMUSCULAR | Status: AC | PRN
  Administered 2020-11-21: 100 mL via INTRAVENOUS

## 2020-11-21 MED ORDER — PANTOPRAZOLE SODIUM 40 MG PO TBEC: 40.0000 mg | DELAYED_RELEASE_TABLET | Freq: Every day | ORAL | 0 refills | Status: DC

## 2020-11-21 NOTE — ED Provider Notes (Incomplete)
She has history of hypertension, diabetes, hyperlipidemia, heart failure, asthma and comes in with abdominal pain which started yesterday and vertigo which started this morning.  Pain is across the upper abdomen without radiation.  She has had numerous episodes of emesis.  On exam, dizziness is reproduced by passive head movement, but there is no nystagmus seen.  Abdomen is tender across the upper abdomen with maximum tenderness in epigastrium and right upper quadrant.  She had no relief of nausea with ondansetron, will give droperidol.  She will be sent for CT of abdomen and pelvis.

## 2020-11-21 NOTE — ED Notes (Signed)
Pt was provided with water and crackers for PO challenge.

## 2020-11-21 NOTE — Discharge Instructions (Signed)
You were seen in the ER today for nausea, vomiting, dizziness, and abdominal pain.  Your CT scan did not show any acute infection.  Your labs looked similar to prior blood work you have had done.  Your blood sugar was high, please be sure to monitor this at home and have it rechecked by your primary care provider.  We are sending you home with the following medications to help with your symptoms:  - Protonix- please take 1 tablet in the morning prior to any meals to help with stomach acidity/pain.  - Carafate- please take prior to each meal and prior to bedtime to help with stomach acidity/pain.  - Zofran- please take every 8 hours as needed for nausea/vomiting.   We are also send you home with meclizine to take every 8 hours as needed for dizziness.   We have prescribed you new medication(s) today. Discuss the medications prescribed today with your pharmacist as they can have adverse effects and interactions with your other medicines including over the counter and prescribed medications. Seek medical evaluation if you start to experience new or abnormal symptoms after taking one of these medicines, seek care immediately if you start to experience difficulty breathing, feeling of your throat closing, facial swelling, or rash as these could be indications of a more serious allergic reaction  Please follow attached diet guidelines.   Follow up with your primary care provider within 3 days for re-evaluation.  Return to the ER for new or worsening symptoms including but not limited to worsened pain, new pain, inability to keep fluids down, blood in vomit/stool, passing out, or any other concerns.

## 2020-12-18 DIAGNOSIS — I10 Essential (primary) hypertension: Secondary | ICD-10-CM | POA: Diagnosis not present

## 2020-12-31 NOTE — Progress Notes (Signed)
Patient referred by Benito Mccreedy, MD for abnormal ABI  Subjective:   [facility], female    DOB: [birth year], 60 y.o.   MRN: [id number]   Chief Complaint  Patient presents with  . Hypertension  . Follow-up    HPI  60 y/o African-American female with hypertension, type 2 diabetes mellitus, hyperlipidemia, coronary artery disease, PAD, nonischemic cardiomyopathy with now recovered EF, pulmonary hypertension  Patient is doing well, denies chest pain, shortness of breath, palpitations, leg edema, orthopnea, PND, TIA/syncope. Claudication has improved. Blood pressure is very well controlled. Diabetes remains uncontrolled. She has f/u w/her PCP on 01/06/2021.  Current Outpatient Medications on File Prior to Visit  Medication Sig Dispense Refill  . acetaminophen (TYLENOL) 325 MG tablet Take 325-650 mg by mouth every 6 (six) hours as needed for mild pain (depends on pain).    Marland Kitchen albuterol (PROVENTIL HFA;VENTOLIN HFA) 108 (90 BASE) MCG/ACT inhaler Inhale 2 puffs into the lungs every 4 (four) hours as needed for wheezing or shortness of breath.    . carvedilol (COREG) 12.5 MG tablet TAKE 1 TABLET BY MOUTH 2 TIMES DAILY WITH A MEAL. (Patient taking differently: Take 12.5 mg by mouth 2 (two) times daily with a meal.) 60 tablet 11  . Cholecalciferol (VITAMIN D PO) Take 125 mcg by mouth in the morning and at bedtime.     . clopidogrel (PLAVIX) 75 MG tablet Take 1 tablet (75 mg total) by mouth daily. 90 tablet 3  . ferrous sulfate 325 (65 FE) MG tablet Take 325 mg by mouth daily with breakfast.    . furosemide (LASIX) 40 MG tablet Take 1 tablet (40 mg total) by mouth daily. (Patient taking differently: Take 20 mg by mouth 2 (two) times daily.) 30 tablet 11  . lisinopril (ZESTRIL) 10 MG tablet Take 10 mg by mouth daily.     . metFORMIN (GLUCOPHAGE) 850 MG tablet Take 850 mg by mouth 2 (two) times daily.     . rosuvastatin (CRESTOR) 40 MG tablet Take 1 tablet (40 mg total) by mouth at  bedtime. 30 tablet 11  . spironolactone (ALDACTONE) 25 MG tablet Take 25 mg by mouth daily.     No current facility-administered medications on file prior to visit.    Cardiovascular and other pertinent studies:  EKG 11/20/2020: Sinus rhythm  Old inferior infarct.   Echocardiogram 03/09/2020:  Normal LV systolic function with visual EF 55-60%. Left ventricle cavity  is normal in size. Moderate left ventricular hypertrophy. Normal global  wall motion. No obvious regional wall motion abnormalities. Doppler  evidence of grade I diastolic dysfunction, elevated LAP. Calculated EF  55%.  No significant valvular abnormalities.  IVC is normal with a respiratory response of <50%.  No significant change compared to prior study dated 11/2015 except Grade I  diastolic dysfunction is new.   ABI 02/06/2020: Rt 0.68 Lt 0.88  LHC/RHC 06/2013: LM: No significant disease LAD: 50% mid LAD stenosis with distal tapering.  LCx: Moderate early OM1 with 50% ostial stenosis. Mid LCx with 40% stenosis.  RCA: Diffuse RCA disease with 70-80% ostial stenosis and damping of the waveform; serial 50% stenoses in the mid and distal RCA.  Reviewed with Dr. Angelena Form.  70-80% stenosis with ostial damping in setting of relatively small caliber RCA.  The RCA is diffusely disease.  In the absence of ACS (no chest pain), would treat medically  Left ventriculography: Global hypokinesis with EF estimated at 35%.  Optimized PCWP on current dose of Lasix (16  mmHg).  Pulmonary arterial HTN with elevated PVR, not responsive to adenosine.  Given co-existing LV dysfunction, I am going to try her on sildenafil 20 mg tid.   She will followup in clinic in about 2 wks.   [place] 05/2013: Mean RA:  20  mmHg RV: 76/12 mmHg PA: 75/36 mmHg with a mean of 49 mmHg PCWP: 27 mmHg  Ao sat : 94% PA sat 50%   Fick Cardiac Output:  3.1 L/minute,  1.8 L/min minute INR INR ute/M2  PVR: 8.0 wood unites 567 dysnes-sec  cm5  Impression:  PAD significantly higher than PCWP indicating some intrinsic pulmonary hypertension.  Increase diuretics and add nitrates Refer to CHF clinic and continue f/u with pulmonary ? [place]  Echocardiogram 11/2015: - Left ventricle: The cavity size was normal. Systolic function was  normal. The estimated ejection fraction was in the range of 55%  to 60%. Wall motion was normal; there were no regional wall  motion abnormalities. Left ventricular diastolic function  parameters were normal.  - Left atrium: The atrium was mildly dilated.   Impressions:   - Global LV longitudinal strain -18.3%.  Parameters of both systolic and diastolic LV function show  improvement compared with previous reports.   Echocardiogram 07/2015: - Left ventricle: The cavity size was normal. Wall thickness was  normal. The estimated ejection fraction was 50%. Diffuse  hypokinesis. Features are consistent with a pseudonormal left  ventricular filling pattern, with concomitant abnormal relaxation  and increased filling pressure (grade 2 diastolic dysfunction).  - Aortic valve: There was no stenosis.  - Mitral valve: There was no significant regurgitation.  - Right ventricle: The cavity size was normal. Systolic function  was normal.  - Pulmonary arteries: No complete TR doppler jet so unable to  estimate PA systolic pressure.  - Inferior vena cava: The vessel was normal in size. The  respirophasic diameter changes were in the normal range (= 50%),  consistent with normal central venous pressure.   Impressions:   - Normal LV size with mild diffuse hypokinesis, EF 50%. Moderate  diastolic dysfunction. Normal RV size and systolic function. No  TR jet obtained so unable to estimate PA systolic pressure.   Echocardiogram 2014: - Left ventricle: The cavity size was normal. Wall thickness  was normal. Systolic function was mildly reduced. The  estimated ejection  fraction was in the range of 45% to  50%. Wall motion was normal; there were no regional wall  motion abnormalities.  - Left atrium: The atrium was mildly dilated.    Recent labs: 11/20/2020: Glucose 413, BUN/Cr 10/1.15. EGFR 54. Na/K 138/3.8. T.bili 1.6. Rest of the CMP normal H/H 13/40. MCV 86. Platelets 380  01/09/2020: Glucose 150, BUN/Cr 8/0.89. EGFR 81. Na/K 144/4.  Rest of the CMP normal H/H 12.0/36.5. MCV 85. Platelets 405 HbA1C 6.9% Chol 160, TG 108, HDL 43, LDL 97    Review of Systems  Cardiovascular: Positive for claudication. Negative for chest pain, dyspnea on exertion, leg swelling, palpitations and syncope.       Vitals:   01/01/21 0928  BP: 118/72  Pulse: 94  Resp: 16  Temp: 98.3 F (36.8 C)  SpO2: 97%     Body mass index is 32.5 kg/m. Filed Weights   01/01/21 0928  Weight: 172 lb (78 kg)     Objective:    Physical Exam Vitals and nursing note reviewed.  Constitutional:      General: She is not in acute distress. Neck:  Vascular: No JVD.  Cardiovascular:     Rate and Rhythm: Normal rate and regular rhythm.     Pulses: Intact distal pulses.          Popliteal pulses are 1+ on the right side and 2+ on the left side.       Dorsalis pedis pulses are 1+ on the right side and 1+ on the left side.       Posterior tibial pulses are 0 on the right side and 0 on the left side.     Heart sounds: Normal heart sounds. No murmur heard.   Pulmonary:     Effort: Pulmonary effort is normal.     Breath sounds: Normal breath sounds. No wheezing or rales.            Assessment & Recommendations:   60 y/o African-American female with hypertension, type 2 diabetes mellitus, hyperlipidemia, coronary artery disease, PAD, nonischemic cardiomyopathy with now recovered EF, pulmonary hypertension  Hypertension: Currently on lisinopril to 20 mg daily, coreg 12.5 mg bid, spironolactone 25 mg daily.  Blood pressure now very well controlled.    PAD: No critical limb ischemia.  Abnormal ABI Rt 0.68, Lt 0.78. Continue medical management, risk factor modification, regular walking. Currently on plavix. Check lipid panel and A1C today. Needs to f/u w/PCP re: diabetes management.  Coronary artery disease: No angina symptoms at this time.  Continue medical therapy.  Pulmonary hypertension: Noted on right heart catheterization in 2014, thought to be out of proportion group 2 pulmonary hypertension.  Continue sildenafil, as she has done remarkably well on this medication.   F/u in 6 months  Emmy Keng Esther Hardy, MD [facility] Pager: [phone] Office: [phone]

## 2021-01-01 ENCOUNTER — Ambulatory Visit: Payer: Medicare HMO | Admitting: Cardiology

## 2021-01-01 ENCOUNTER — Other Ambulatory Visit: Payer: Self-pay

## 2021-01-01 ENCOUNTER — Encounter: Payer: Self-pay | Admitting: Cardiology

## 2021-01-01 VITALS — BP 118/72 | HR 94 | Temp 98.3°F | Resp 16 | Ht 61.0 in | Wt 172.0 lb

## 2021-01-01 DIAGNOSIS — I739 Peripheral vascular disease, unspecified: Secondary | ICD-10-CM | POA: Diagnosis not present

## 2021-01-01 DIAGNOSIS — I10 Essential (primary) hypertension: Secondary | ICD-10-CM | POA: Diagnosis not present

## 2021-01-01 DIAGNOSIS — I2510 Atherosclerotic heart disease of native coronary artery without angina pectoris: Secondary | ICD-10-CM

## 2021-01-02 LAB — HEMOGLOBIN A1C
Est. average glucose Bld gHb Est-mCnc: 292 mg/dL
Hgb A1c MFr Bld: 11.8 % — ABNORMAL HIGH (ref 4.8–5.6)

## 2021-01-02 NOTE — Progress Notes (Signed)
Please forward the results to PCP.  Regards, Dr. Virgina Jock

## 2021-01-06 DIAGNOSIS — U071 COVID-19: Secondary | ICD-10-CM | POA: Diagnosis not present

## 2021-01-06 DIAGNOSIS — E7800 Pure hypercholesterolemia, unspecified: Secondary | ICD-10-CM | POA: Diagnosis not present

## 2021-01-06 DIAGNOSIS — R0602 Shortness of breath: Secondary | ICD-10-CM | POA: Diagnosis not present

## 2021-01-06 DIAGNOSIS — N1831 Chronic kidney disease, stage 3a: Secondary | ICD-10-CM | POA: Diagnosis not present

## 2021-01-06 DIAGNOSIS — I10 Essential (primary) hypertension: Secondary | ICD-10-CM | POA: Diagnosis not present

## 2021-01-06 DIAGNOSIS — E6609 Other obesity due to excess calories: Secondary | ICD-10-CM | POA: Diagnosis not present

## 2021-01-06 DIAGNOSIS — J4520 Mild intermittent asthma, uncomplicated: Secondary | ICD-10-CM | POA: Diagnosis not present

## 2021-01-06 DIAGNOSIS — I5032 Chronic diastolic (congestive) heart failure: Secondary | ICD-10-CM | POA: Diagnosis not present

## 2021-01-06 DIAGNOSIS — E1165 Type 2 diabetes mellitus with hyperglycemia: Secondary | ICD-10-CM | POA: Diagnosis not present

## 2021-01-08 NOTE — Progress Notes (Signed)
Ok done

## 2021-01-18 DIAGNOSIS — I10 Essential (primary) hypertension: Secondary | ICD-10-CM | POA: Diagnosis not present

## 2021-01-26 DIAGNOSIS — E119 Type 2 diabetes mellitus without complications: Secondary | ICD-10-CM | POA: Diagnosis not present

## 2021-01-26 DIAGNOSIS — I739 Peripheral vascular disease, unspecified: Secondary | ICD-10-CM | POA: Diagnosis not present

## 2021-01-26 DIAGNOSIS — M2011 Hallux valgus (acquired), right foot: Secondary | ICD-10-CM | POA: Diagnosis not present

## 2021-02-05 DIAGNOSIS — Z1231 Encounter for screening mammogram for malignant neoplasm of breast: Secondary | ICD-10-CM | POA: Diagnosis not present

## 2021-02-18 DIAGNOSIS — I10 Essential (primary) hypertension: Secondary | ICD-10-CM | POA: Diagnosis not present

## 2021-02-22 DIAGNOSIS — E119 Type 2 diabetes mellitus without complications: Secondary | ICD-10-CM | POA: Diagnosis not present

## 2021-02-22 DIAGNOSIS — H04123 Dry eye syndrome of bilateral lacrimal glands: Secondary | ICD-10-CM | POA: Diagnosis not present

## 2021-02-22 DIAGNOSIS — H1045 Other chronic allergic conjunctivitis: Secondary | ICD-10-CM | POA: Diagnosis not present

## 2021-02-22 DIAGNOSIS — H18603 Keratoconus, unspecified, bilateral: Secondary | ICD-10-CM | POA: Diagnosis not present

## 2021-02-22 DIAGNOSIS — H35033 Hypertensive retinopathy, bilateral: Secondary | ICD-10-CM | POA: Diagnosis not present

## 2021-02-22 DIAGNOSIS — H2513 Age-related nuclear cataract, bilateral: Secondary | ICD-10-CM | POA: Diagnosis not present

## 2021-03-08 ENCOUNTER — Encounter: Payer: Self-pay | Admitting: Pharmacist

## 2021-03-08 NOTE — Progress Notes (Signed)
CARE PLAN ENTRY  03/08/2021 Name: [name] MRN: [id number] DOB: 11-Jul-1961  [facility] is enrolled in Remote Patient Monitoring/Principle Care Monitoring.  Date of Enrollment: 05/29/20 Supervising physician: Vernell Leep Indication: HTN  Remote Readings: Compliant and Avg BP: 142/79, HR:71  Next scheduled OV: 07/01/21  Pharmacist Clinical Goal(s):  Marland Kitchen Over the next 90 days, patient will demonstrate Improved medication adherence as evidenced by medication fill history . Over the next 90 days, patient will demonstrate improved understanding of prescribed medications and rationale for usage as evidenced by patient teach back . Over the next 90 days, patient will experience decrease in ED visits. ED visits in last 6 months = 1 . Over the next 90 days, patient will not experience hospital admission. Hospital Admissions in last 6 months = 0  Interventions: . Provider and Inter-disciplinary care team collaboration (see longitudinal plan of care) . Comprehensive medication review performed. . Discussed plans with patient for ongoing care management follow up and provided patient with direct contact information for care management team . Collaboration with provider re: medication management  Patient Self Care Activities:  . Self administers medications as prescribed . Attends all scheduled provider appointments . Performs ADL's independently . Performs IADL's independently  Patient Active Problem List   Diagnosis Date Noted  . PAD (peripheral artery disease) ([place]) 02/26/2020  . S/P hysterectomy 01/15/2015  . Coronary artery disease involving native coronary artery of native heart without angina pectoris 07/29/2013  . Other sleep disturbances 07/02/2013  . Chronic systolic heart failure ([place]) 06/25/2013  . GERD (gastroesophageal reflux disease) 06/18/2013  . Pulmonary hypertension, unspecified ([place]) 06/17/2013  . Ascites 06/07/2013  . Acute systolic CHF (congestive  heart failure) ([place]) 04/19/2013  . Acute on chronic systolic CHF (congestive heart failure) ([place]) 04/19/2013  . Elevated troponin 04/19/2013  . Nonischemic cardiomyopathy ([place])   . Essential hypertension   . Hypercholesterolemia   . Microcytic anemia    Past Surgical History:  Procedure Laterality Date  . BILATERAL SALPINGECTOMY Bilateral 01/15/2015   Procedure: BILATERAL SALPINGECTOMY;  Surgeon: Marvene Staff, MD;  Location: [facility];  Service: Gynecology;  Laterality: Bilateral;  . CORONARY ANGIOPLASTY  2005  . LEFT AND RIGHT HEART CATHETERIZATION WITH CORONARY ANGIOGRAM N/A 07/05/2013   Procedure: LEFT AND RIGHT HEART CATHETERIZATION WITH CORONARY ANGIOGRAM;  Surgeon: Larey Dresser, MD;  Location: [facility] CATH LAB;  Service: Cardiovascular;  Laterality: N/A;  . RIGHT HEART CATHETERIZATION N/A 06/13/2013   Procedure: RIGHT HEART CATH;  Surgeon: Josue Hector, MD;  Location: [facility] CATH LAB;  Service: Cardiovascular;  Laterality: N/A;  . ROBOTIC ASSISTED TOTAL HYSTERECTOMY N/A 01/15/2015   Procedure: ROBOTIC ASSISTED TOTAL HYSTERECTOMY;  Surgeon: Marvene Staff, MD;  Location: [facility];  Service: Gynecology;  Laterality: N/A;  . TUBAL LIGATION    . WISDOM TOOTH EXTRACTION     Social History   Socioeconomic History  . Marital status: Single    Spouse name: Not on file  . Number of children: 3  . Years of education: Not on file  . Highest education level: Not on file  Occupational History  . Occupation: disabled  Tobacco Use  . Smoking status: Never Smoker  . Smokeless tobacco: Never Used  Vaping Use  . Vaping Use: Never used  Substance and Sexual Activity  . Alcohol use: No  . Drug use: No  . Sexual activity: Yes    Birth control/protection: Surgical  Other Topics Concern  . Not on file  Social History Narrative  .  Not on file   Social Determinants of Health   Financial Resource Strain: Not on file  Food Insecurity: Not on file  Transportation Needs: Not on file   Physical Activity: Not on file  Stress: Not on file  Social Connections: Not on file   Family History  Problem Relation Age of Onset  . Hypertension Sister   . Hyperlipidemia Sister   . Heart disease Brother        History of heart transplant  . Hypertension Brother   . Hypertension Sister   . Hyperlipidemia Sister   . Heart disease Brother        Coronary artery disease  . Hypertension Brother   . Heart disease Brother   . Hypertension Brother   . Heart disease Brother   . Hypertension Brother    No Known Allergies Outpatient Encounter Medications as of 03/08/2021  Medication Sig  . acetaminophen (TYLENOL) 325 MG tablet Take 325-650 mg by mouth every 6 (six) hours as needed for mild pain (depends on pain).  Marland Kitchen albuterol (PROVENTIL HFA;VENTOLIN HFA) 108 (90 BASE) MCG/ACT inhaler Inhale 2 puffs into the lungs every 4 (four) hours as needed for wheezing or shortness of breath.  . carvedilol (COREG) 12.5 MG tablet TAKE 1 TABLET BY MOUTH 2 TIMES DAILY WITH A MEAL. (Patient taking differently: Take 12.5 mg by mouth 2 (two) times daily with a meal.)  . Cholecalciferol (VITAMIN D PO) Take 125 mcg by mouth in the morning and at bedtime.   . clopidogrel (PLAVIX) 75 MG tablet Take 1 tablet (75 mg total) by mouth daily.  . ferrous sulfate 325 (65 FE) MG tablet Take 325 mg by mouth daily with breakfast.  . furosemide (LASIX) 40 MG tablet Take 1 tablet (40 mg total) by mouth daily. (Patient taking differently: Take 20 mg by mouth 2 (two) times daily.)  . lisinopril (ZESTRIL) 10 MG tablet Take 10 mg by mouth daily.   . metFORMIN (GLUCOPHAGE) 850 MG tablet Take 850 mg by mouth 2 (two) times daily.   . rosuvastatin (CRESTOR) 40 MG tablet Take 1 tablet (40 mg total) by mouth at bedtime.  Marland Kitchen spironolactone (ALDACTONE) 25 MG tablet Take 25 mg by mouth daily.   No facility-administered encounter medications on file as of 03/08/2021.   Patient Care Team    Relationship Specialty Notifications Start  End  Benito Mccreedy, MD PCP - General Internal Medicine  05/21/13       Hypertension   BP goal is:  <130/80  Office blood pressures are  BP Readings from Last 3 Encounters:  01/01/21 118/72  11/21/20 (!) 184/83  07/01/20 134/76    Patient is currently uncontrolled on the following medications: carvedilol 12.5 mg BID, lasix 40 mg, lisinopril 10 mg, spironolactone 25 mg   Patient checks BP at home daily  Patient home BP readings are ranging: 111-170/65-107  Patient has failed these meds in the past: isordil, amlodipine,   We discussed diet and exercise extensively  Plan  Continue current medications and control with diet and exercise   May consider increasing carvedilol dose to 25 mg BID if BP continues to remain elevated.  ______________ Visit Information SDOH (Social Determinants of Health) assessments performed: Yes.  Ms. Rodier was given information about Principle Care Management/Remote Patient Monitoring services today including:  1. RPM/PCM service includes personalized support from designated clinical staff supervised by her physician, including individualized plan of care and coordination with other care providers 2. 24/7 contact phone numbers for assistance  for urgent and routine care needs. 3. Standard insurance, coinsurance, copays and deductibles apply for principle care management only during months in which we provide at least 30 minutes of these services. Most insurances cover these services at 100%, however patients may be responsible for any copay, coinsurance and/or deductible if applicable. This service may help you avoid the need for more expensive face-to-face services. 4. Only one practitioner may furnish and bill the service in a calendar month. 5. The patient may stop PCM/RPM services at any time (effective at the end of the month) by phone call to the office staff.  Patient agreed to services and verbal consent obtained.   Manuela Schwartz,  Pharm.D. [facility] [phone]

## 2021-03-11 DIAGNOSIS — N1831 Chronic kidney disease, stage 3a: Secondary | ICD-10-CM | POA: Diagnosis not present

## 2021-03-11 DIAGNOSIS — J4520 Mild intermittent asthma, uncomplicated: Secondary | ICD-10-CM | POA: Diagnosis not present

## 2021-03-11 DIAGNOSIS — E6609 Other obesity due to excess calories: Secondary | ICD-10-CM | POA: Diagnosis not present

## 2021-03-11 DIAGNOSIS — I5032 Chronic diastolic (congestive) heart failure: Secondary | ICD-10-CM | POA: Diagnosis not present

## 2021-03-11 DIAGNOSIS — Z0000 Encounter for general adult medical examination without abnormal findings: Secondary | ICD-10-CM | POA: Diagnosis not present

## 2021-03-11 DIAGNOSIS — E1165 Type 2 diabetes mellitus with hyperglycemia: Secondary | ICD-10-CM | POA: Diagnosis not present

## 2021-03-11 DIAGNOSIS — E7800 Pure hypercholesterolemia, unspecified: Secondary | ICD-10-CM | POA: Diagnosis not present

## 2021-03-11 DIAGNOSIS — I10 Essential (primary) hypertension: Secondary | ICD-10-CM | POA: Diagnosis not present

## 2021-03-20 DIAGNOSIS — I10 Essential (primary) hypertension: Secondary | ICD-10-CM | POA: Diagnosis not present

## 2021-04-12 DIAGNOSIS — D509 Iron deficiency anemia, unspecified: Secondary | ICD-10-CM | POA: Diagnosis not present

## 2021-04-12 DIAGNOSIS — N183 Chronic kidney disease, stage 3 unspecified: Secondary | ICD-10-CM | POA: Diagnosis not present

## 2021-04-19 DIAGNOSIS — I10 Essential (primary) hypertension: Secondary | ICD-10-CM | POA: Diagnosis not present

## 2021-04-22 DIAGNOSIS — I509 Heart failure, unspecified: Secondary | ICD-10-CM | POA: Diagnosis not present

## 2021-04-22 DIAGNOSIS — D631 Anemia in chronic kidney disease: Secondary | ICD-10-CM | POA: Diagnosis not present

## 2021-04-22 DIAGNOSIS — N2581 Secondary hyperparathyroidism of renal origin: Secondary | ICD-10-CM | POA: Diagnosis not present

## 2021-04-22 DIAGNOSIS — N1831 Chronic kidney disease, stage 3a: Secondary | ICD-10-CM | POA: Diagnosis not present

## 2021-04-22 DIAGNOSIS — I129 Hypertensive chronic kidney disease with stage 1 through stage 4 chronic kidney disease, or unspecified chronic kidney disease: Secondary | ICD-10-CM | POA: Diagnosis not present

## 2021-04-22 DIAGNOSIS — E669 Obesity, unspecified: Secondary | ICD-10-CM | POA: Diagnosis not present

## 2021-04-22 DIAGNOSIS — E1122 Type 2 diabetes mellitus with diabetic chronic kidney disease: Secondary | ICD-10-CM | POA: Diagnosis not present

## 2021-05-19 DIAGNOSIS — I10 Essential (primary) hypertension: Secondary | ICD-10-CM | POA: Diagnosis not present

## 2021-06-10 DIAGNOSIS — Z1321 Encounter for screening for nutritional disorder: Secondary | ICD-10-CM | POA: Diagnosis not present

## 2021-06-10 DIAGNOSIS — I5032 Chronic diastolic (congestive) heart failure: Secondary | ICD-10-CM | POA: Diagnosis not present

## 2021-06-10 DIAGNOSIS — E7800 Pure hypercholesterolemia, unspecified: Secondary | ICD-10-CM | POA: Diagnosis not present

## 2021-06-10 DIAGNOSIS — I10 Essential (primary) hypertension: Secondary | ICD-10-CM | POA: Diagnosis not present

## 2021-06-10 DIAGNOSIS — J4520 Mild intermittent asthma, uncomplicated: Secondary | ICD-10-CM | POA: Diagnosis not present

## 2021-06-10 DIAGNOSIS — Z0001 Encounter for general adult medical examination with abnormal findings: Secondary | ICD-10-CM | POA: Diagnosis not present

## 2021-06-10 DIAGNOSIS — E6609 Other obesity due to excess calories: Secondary | ICD-10-CM | POA: Diagnosis not present

## 2021-06-10 DIAGNOSIS — E1165 Type 2 diabetes mellitus with hyperglycemia: Secondary | ICD-10-CM | POA: Diagnosis not present

## 2021-06-10 DIAGNOSIS — N1831 Chronic kidney disease, stage 3a: Secondary | ICD-10-CM | POA: Diagnosis not present

## 2021-06-17 DIAGNOSIS — I10 Essential (primary) hypertension: Secondary | ICD-10-CM | POA: Diagnosis not present

## 2021-06-23 DIAGNOSIS — N1831 Chronic kidney disease, stage 3a: Secondary | ICD-10-CM | POA: Diagnosis not present

## 2021-06-23 DIAGNOSIS — E6609 Other obesity due to excess calories: Secondary | ICD-10-CM | POA: Diagnosis not present

## 2021-06-23 DIAGNOSIS — J4520 Mild intermittent asthma, uncomplicated: Secondary | ICD-10-CM | POA: Diagnosis not present

## 2021-06-23 DIAGNOSIS — I5032 Chronic diastolic (congestive) heart failure: Secondary | ICD-10-CM | POA: Diagnosis not present

## 2021-06-23 DIAGNOSIS — I10 Essential (primary) hypertension: Secondary | ICD-10-CM | POA: Diagnosis not present

## 2021-06-23 DIAGNOSIS — E7800 Pure hypercholesterolemia, unspecified: Secondary | ICD-10-CM | POA: Diagnosis not present

## 2021-06-23 DIAGNOSIS — E1165 Type 2 diabetes mellitus with hyperglycemia: Secondary | ICD-10-CM | POA: Diagnosis not present

## 2021-07-01 ENCOUNTER — Other Ambulatory Visit: Payer: Self-pay

## 2021-07-01 ENCOUNTER — Ambulatory Visit: Payer: Medicare HMO | Admitting: Cardiology

## 2021-07-01 ENCOUNTER — Encounter: Payer: Self-pay | Admitting: Cardiology

## 2021-07-01 VITALS — BP 165/75 | HR 67 | Temp 98.0°F | Resp 16 | Ht 61.0 in | Wt 174.0 lb

## 2021-07-01 DIAGNOSIS — I2510 Atherosclerotic heart disease of native coronary artery without angina pectoris: Secondary | ICD-10-CM | POA: Diagnosis not present

## 2021-07-01 DIAGNOSIS — I10 Essential (primary) hypertension: Secondary | ICD-10-CM

## 2021-07-01 DIAGNOSIS — I739 Peripheral vascular disease, unspecified: Secondary | ICD-10-CM

## 2021-07-01 DIAGNOSIS — E1165 Type 2 diabetes mellitus with hyperglycemia: Secondary | ICD-10-CM | POA: Insufficient documentation

## 2021-07-01 DIAGNOSIS — E7800 Pure hypercholesterolemia, unspecified: Secondary | ICD-10-CM | POA: Diagnosis not present

## 2021-07-01 MED ORDER — CILOSTAZOL 50 MG PO TABS
50.0000 mg | ORAL_TABLET | Freq: Two times a day (BID) | ORAL | 2 refills | Status: DC
Start: 2021-07-01 — End: 2024-01-31

## 2021-07-01 NOTE — Progress Notes (Signed)
Patient referred by Benito Mccreedy, MD for abnormal ABI  Subjective:   [facility], female    DOB: 1961-06-27, 60 y.o.   MRN: [id number]   Chief Complaint  Patient presents with   PAD (peripheral artery disease)   Hypertension   Follow-up    6 month    HPI  60 y/o African-American female with hypertension, type 2 diabetes mellitus, hyperlipidemia, coronary artery disease, PAD, nonischemic cardiomyopathy with now recovered EF, pulmonary hypertension  Patient's blood pressure is elevated today in the office, but well controlled at home. She reports having had some stressful arguments at home. Diabetes remains uncontrolled. She has bilateral calf claudication, improves with rest.  Sometimes, she has pain at rest when she lays in bed, that improves with leg elevation.  Current Outpatient Medications on File Prior to Visit  Medication Sig Dispense Refill   acetaminophen (TYLENOL) 325 MG tablet Take 325-650 mg by mouth every 6 (six) hours as needed for mild pain (depends on pain).     albuterol (PROVENTIL HFA;VENTOLIN HFA) 108 (90 BASE) MCG/ACT inhaler Inhale 2 puffs into the lungs every 4 (four) hours as needed for wheezing or shortness of breath.     carvedilol (COREG) 12.5 MG tablet TAKE 1 TABLET BY MOUTH 2 TIMES DAILY WITH A MEAL. (Patient taking differently: Take 12.5 mg by mouth 2 (two) times daily with a meal.) 60 tablet 11   Cholecalciferol (VITAMIN D PO) Take 125 mcg by mouth in the morning and at bedtime.      clopidogrel (PLAVIX) 75 MG tablet Take 1 tablet (75 mg total) by mouth daily. 90 tablet 3   ferrous sulfate 325 (65 FE) MG tablet Take 325 mg by mouth daily with breakfast.     furosemide (LASIX) 40 MG tablet Take 1 tablet (40 mg total) by mouth daily. (Patient taking differently: Take 20 mg by mouth 2 (two) times daily.) 30 tablet 11   lisinopril (ZESTRIL) 10 MG tablet Take 10 mg by mouth daily.      metFORMIN (GLUCOPHAGE) 850 MG tablet Take 850 mg by mouth 2  (two) times daily.      rosuvastatin (CRESTOR) 40 MG tablet Take 1 tablet (40 mg total) by mouth at bedtime. 30 tablet 11   spironolactone (ALDACTONE) 25 MG tablet Take 25 mg by mouth daily.     No current facility-administered medications on file prior to visit.    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 Left ventricle cavity  is normal in size. Moderate left ventricular hypertrophy. Normal global  wall motion. No obvious regional wall motion abnormalities. Doppler  evidence of grade I diastolic dysfunction, elevated LAP. Calculated EF  55%.  No significant valvular abnormalities.  IVC is normal with a respiratory response of <50%.  No significant change compared to prior study dated 11/2015 except Grade I  diastolic dysfunction is new.   ABI 02/06/2020: Rt 0.68 Lt 0.88  LHC/RHC 06/2013: LM: No significant disease LAD: 50% mid LAD stenosis with distal tapering.  LCx: Moderate early OM1 with 50% ostial stenosis. Mid LCx with 40% stenosis.  RCA: Diffuse RCA disease with 70-80% ostial stenosis and damping of the waveform; serial 50% stenoses in the mid and distal RCA.  Reviewed with Dr. Angelena Form.  70-80% stenosis with ostial damping in setting of relatively small caliber RCA.  The  RCA is diffusely disease.  In the absence of ACS (no chest pain), would treat medically  Left ventriculography: Global hypokinesis with EF estimated at 35%.  Optimized PCWP on current dose of Lasix (16 mmHg).   Pulmonary arterial HTN with elevated PVR, not responsive to adenosine.  Given co-existing LV dysfunction, I am going to try her on sildenafil 20 mg tid.   She will followup in clinic in about 2 wks.   [place] 05/2013: Mean RA:  20  mmHg RV: 76/12 mmHg PA: 75/36 mmHg with a mean of 49 mmHg PCWP: 27 mmHg   Ao sat  : 94% PA sat 50%    Fick Cardiac Output:  3.1 L/minute,  1.8 L/min minute INR INR ute/M2   PVR: 8.0 wood unites 567 dysnes-sec cm5   Impression:  PAD significantly higher than PCWP indicating some intrinsic pulmonary hypertension.  Increase diuretics and add nitrates Refer to CHF clinic and continue f/u with pulmonary ? Flolan  Echocardiogram 11/2015: - Left ventricle: The cavity size was normal. Systolic function was    normal. The estimated ejection fraction was in the range of 55%    to 60%. Wall motion was normal; there were no regional wall    motion abnormalities. Left ventricular diastolic function    parameters were normal.  - Left atrium: The atrium was mildly dilated.   Impressions:   - Global LV longitudinal strain -18.3%.    Parameters of both systolic and diastolic LV function show    improvement compared with previous reports.   Echocardiogram 07/2015: - Left ventricle: The cavity size was normal. Wall thickness was    normal. The estimated ejection fraction was 50%. Diffuse    hypokinesis. Features are consistent with a pseudonormal left    ventricular filling pattern, with concomitant abnormal relaxation    and increased filling pressure (grade 2 diastolic dysfunction).  - Aortic valve: There was no stenosis.  - Mitral valve: There was no significant regurgitation.  - Right ventricle: The cavity size was normal. Systolic function    was normal.  - Pulmonary arteries: No complete TR doppler jet so unable to    estimate PA systolic pressure.  - Inferior vena cava: The vessel was normal in size. The    respirophasic diameter changes were in the normal range (= 50%),    consistent with normal central venous pressure.   Impressions:   - Normal LV size with mild diffuse hypokinesis, EF 50%. Moderate    diastolic dysfunction. Normal RV size and systolic function. No    TR jet obtained so unable to estimate PA systolic pressure.   Echocardiogram 2014: - Left  ventricle: The cavity size was normal. Wall thickness    was normal. Systolic function was mildly reduced. The    estimated ejection fraction was in the range of 45% to    50%. Wall motion was normal; there were no regional wall    motion abnormalities.  - Left atrium: The atrium was mildly dilated.    Recent labs: 01/01/2021: HbA1C 11.8%  11/20/2020: Glucose 413, BUN/Cr 10/1.15. EGFR 54. Na/K 138/3.8. T.bili 1.6. Rest of the CMP normal H/H 13/40. MCV 86. Platelets 380  01/09/2020: Glucose 150, BUN/Cr 8/0.89. EGFR 81. Na/K 144/4.  Rest of the CMP normal H/H 12.0/36.5. MCV 85. Platelets 405 HbA1C 6.9% Chol 160, TG 108, HDL 43, LDL 97    Review of Systems  Cardiovascular: Positive for claudication. Negative for chest pain, dyspnea on exertion, leg swelling, palpitations and syncope.  Vitals:   07/01/21 0942  BP: (!) 165/75  Pulse: 67  Resp: 16  Temp: 98 F (36.7 C)  SpO2: 99%     Body mass index is 32.88 kg/m. Filed Weights   07/01/21 0942  Weight: 174 lb (78.9 kg)     Objective:    Physical Exam Vitals and nursing note reviewed.  Constitutional:      General: She is not in acute distress. Neck:     Vascular: No JVD.  Cardiovascular:     Rate and Rhythm: Normal rate and regular rhythm.     Pulses: Intact distal pulses.          Femoral pulses are 1+ on the right side and 1+ on the left side.      Dorsalis pedis pulses are 0 on the right side and 1+ on the left side.       Posterior tibial pulses are 0 on the right side and 0 on the left side.     Heart sounds: Normal heart sounds. No murmur heard. Pulmonary:     Effort: Pulmonary effort is normal.     Breath sounds: Normal breath sounds. No wheezing or rales.           Assessment & Recommendations:   60 y/o African-American female with hypertension, type 2 diabetes mellitus, hyperlipidemia, coronary artery disease, PAD, nonischemic cardiomyopathy with now recovered EF, pulmonary  hypertension  Hypertension: Currently on lisinopril to 20 mg daily, coreg 12.5 mg bid, spironolactone 25 mg daily.  Blood pressure now very well controlled.   PAD: No critical limb ischemia.  Abnormal ABI Rt 0.68, Lt 0.78 (01/2020). Currently on plavix.  She has previous history of heart failure, her EF has recovered and she remains euvolemic.  Added Pletal 50 mg twice daily. Fortunately, no critical limb ischemia at this time.  Continue aggressive medical management and regular walking.  Needs aggressive risk factor modification, especially diabetes and hyperlipidemia, see below.  If no improvement, could consider revascularization in future.  Mixed hyperlipidemia: LDL has remained elevated on Crestor 40 mg, and Zetia 10 mg daily.  Repeat lipid panel today.  Given her CAD and PAD, discussed with the patient regarding subcutaneous management, Repatha or Inclisiran. Patient would like to think about it.  Uncontrolled type 2 DM: Needs aggressive management.  Will check A1c.  Will forward results to PCP for any changes needed.  Coronary artery disease: No angina symptoms at this time.  Continue medical therapy.  Pulmonary hypertension: Noted on right heart catheterization in 2014, thought to be out of proportion group 2 pulmonary hypertension.  Continue sildenafil, as she has done remarkably well on this medication.   F/u in 6 months  Cachet Mccutchen Esther Hardy, MD [facility] Pager: [phone] Office: [phone]

## 2021-07-02 LAB — LIPID PANEL
Chol/HDL Ratio: 4.8 ratio — ABNORMAL HIGH (ref 0.0–4.4)
Cholesterol, Total: 189 mg/dL (ref 100–199)
HDL: 39 mg/dL — ABNORMAL LOW (ref >39)
LDL Chol Calc (NIH): 121 mg/dL — ABNORMAL HIGH (ref 0–99)
Triglycerides: 160 mg/dL — ABNORMAL HIGH (ref 0–149)
VLDL Cholesterol Cal: 29 mg/dL (ref 5–40)

## 2021-07-02 LAB — HEMOGLOBIN A1C
Est. average glucose Bld gHb Est-mCnc: 303 mg/dL
Hgb A1c MFr Bld: 12.2 % — ABNORMAL HIGH (ref 4.8–5.6)

## 2021-07-16 ENCOUNTER — Telehealth: Payer: Self-pay | Admitting: Pharmacist

## 2021-07-19 NOTE — Telephone Encounter (Signed)
Benefit check for Leqvio completed and pt is covered for Leqvio. Pt has a $0 copay. Called and discussed with pt. Pt agreeable to initiate Leqvio for LDL reduction and ASCVD risk reduction. Pt referred to alternative injection center through [facility]. Referral application faxed over

## 2021-07-23 DIAGNOSIS — I10 Essential (primary) hypertension: Secondary | ICD-10-CM | POA: Diagnosis not present

## 2021-07-28 NOTE — Progress Notes (Signed)
Patient referred by Benito Mccreedy, MD for abnormal ABI  Subjective:   [facility], female    DOB: 05-26-1961, 60 y.o.   MRN: [id number]   Chief Complaint  Patient presents with   PAD (peripheral artery disease)   Hypertension   Follow-up    4 weeks    HPI  60 y/o African-American female with hypertension, type 2 diabetes mellitus, hyperlipidemia, coronary artery disease, PAD, nonischemic cardiomyopathy with now recovered EF, pulmonary hypertension  She has claudication in both legs, improved with Pletal. Blood pressure is well controlled. Diabetes and lipids remains extremely uncontrolled. She is only taking metformin for diabetes.   Current Outpatient Medications on File Prior to Visit  Medication Sig Dispense Refill   acetaminophen (TYLENOL) 325 MG tablet Take 325-650 mg by mouth every 6 (six) hours as needed for mild pain (depends on pain).     albuterol (PROVENTIL HFA;VENTOLIN HFA) 108 (90 BASE) MCG/ACT inhaler Inhale 2 puffs into the lungs every 4 (four) hours as needed for wheezing or shortness of breath.     carvedilol (COREG) 12.5 MG tablet TAKE 1 TABLET BY MOUTH 2 TIMES DAILY WITH A MEAL. (Patient taking differently: Take 12.5 mg by mouth 2 (two) times daily with a meal.) 60 tablet 11   Cholecalciferol (VITAMIN D PO) Take 125 mcg by mouth in the morning and at bedtime.      cilostazol (PLETAL) 50 MG tablet Take 1 tablet (50 mg total) by mouth 2 (two) times daily. 60 tablet 2   clopidogrel (PLAVIX) 75 MG tablet Take 1 tablet (75 mg total) by mouth daily. 90 tablet 3   ezetimibe (ZETIA) 10 MG tablet Take 10 mg by mouth daily.     ferrous sulfate 325 (65 FE) MG tablet Take 325 mg by mouth daily with breakfast.     furosemide (LASIX) 40 MG tablet Take 1 tablet (40 mg total) by mouth daily. (Patient taking differently: Take 20 mg by mouth 2 (two) times daily.) 30 tablet 11   lisinopril (ZESTRIL) 10 MG tablet Take 10 mg by mouth daily.      metFORMIN (GLUCOPHAGE) 1000  MG tablet Take 1,000 mg by mouth 2 (two) times daily.     rosuvastatin (CRESTOR) 40 MG tablet Take 1 tablet (40 mg total) by mouth at bedtime. 30 tablet 11   spironolactone (ALDACTONE) 25 MG tablet Take 25 mg by mouth daily.     glipiZIDE (GLUCOTROL) 5 MG tablet Take 5 mg by mouth 2 (two) times daily before a meal. (Patient not taking: Reported on 07/29/2021)     No current facility-administered medications on file prior to visit.    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 Left ventricle cavity  is normal in size. Moderate left ventricular hypertrophy. Normal global  wall motion. No obvious regional wall motion abnormalities. Doppler  evidence of grade I diastolic dysfunction, elevated LAP. Calculated EF  55%.  No significant valvular abnormalities.  IVC is normal with a respiratory response of <50%.  No significant change compared to prior study dated 11/2015 except Grade I  diastolic dysfunction is new.   ABI 02/06/2020: Rt 0.68 Lt 0.88  LHC/RHC 06/2013: LM: No significant disease LAD: 50% mid LAD stenosis with distal tapering.  LCx: Moderate early OM1 with 50% ostial stenosis. Mid LCx with 40% stenosis.  RCA: Diffuse  RCA disease with 70-80% ostial stenosis and damping of the waveform; serial 50% stenoses in the mid and distal RCA.  Reviewed with Dr. Angelena Form.  70-80% stenosis with ostial damping in setting of relatively small caliber RCA.  The RCA is diffusely disease.  In the absence of ACS (no chest pain), would treat medically  Left ventriculography: Global hypokinesis with EF estimated at 35%.  Optimized PCWP on current dose of Lasix (16 mmHg).   Pulmonary arterial HTN with elevated PVR, not responsive to adenosine.  Given co-existing LV dysfunction, I am going to try her on sildenafil 20  mg tid.   She will followup in clinic in about 2 wks.   [place] 05/2013: Mean RA:  20  mmHg RV: 76/12 mmHg PA: 75/36 mmHg with a mean of 49 mmHg PCWP: 27 mmHg   Ao sat : 94% PA sat 50%    Fick Cardiac Output:  3.1 L/minute,  1.8 L/min minute INR INR ute/M2   PVR: 8.0 wood unites 567 dysnes-sec cm5   Impression:  PAD significantly higher than PCWP indicating some intrinsic pulmonary hypertension.  Increase diuretics and add nitrates Refer to CHF clinic and continue f/u with pulmonary ? Flolan  Echocardiogram 11/2015: - Left ventricle: The cavity size was normal. Systolic function was    normal. The estimated ejection fraction was in the range of 55%    to 60%. Wall motion was normal; there were no regional wall    motion abnormalities. Left ventricular diastolic function    parameters were normal.  - Left atrium: The atrium was mildly dilated.   Impressions:   - Global LV longitudinal strain -18.3%.    Parameters of both systolic and diastolic LV function show    improvement compared with previous reports.   Echocardiogram 07/2015: - Left ventricle: The cavity size was normal. Wall thickness was    normal. The estimated ejection fraction was 50%. Diffuse    hypokinesis. Features are consistent with a pseudonormal left    ventricular filling pattern, with concomitant abnormal relaxation    and increased filling pressure (grade 2 diastolic dysfunction).  - Aortic valve: There was no stenosis.  - Mitral valve: There was no significant regurgitation.  - Right ventricle: The cavity size was normal. Systolic function    was normal.  - Pulmonary arteries: No complete TR doppler jet so unable to    estimate PA systolic pressure.  - Inferior vena cava: The vessel was normal in size. The    respirophasic diameter changes were in the normal range (= 50%),    consistent with normal central venous pressure.   Impressions:   - Normal LV size with mild diffuse hypokinesis, EF 50%.  Moderate    diastolic dysfunction. Normal RV size and systolic function. No    TR jet obtained so unable to estimate PA systolic pressure.   Echocardiogram 2014: - Left ventricle: The cavity size was normal. Wall thickness    was normal. Systolic function was mildly reduced. The    estimated ejection fraction was in the range of 45% to    50%. Wall motion was normal; there were no regional wall    motion abnormalities.  - Left atrium: The atrium was mildly dilated.    Recent labs: 07/01/2021: Chol 189, TG 160, HDL 39, LDL 121 HbA1C 12.2%  01/01/2021: HbA1C 11.8%  11/20/2020: Glucose 413, BUN/Cr 10/1.15. EGFR 54. Na/K 138/3.8. T.bili 1.6. Rest of the CMP normal H/H 13/40. MCV 86. Platelets 380  01/09/2020: Glucose  150, BUN/Cr 8/0.89. EGFR 81. Na/K 144/4.  Rest of the CMP normal H/H 12.0/36.5. MCV 85. Platelets 405 HbA1C 6.9% Chol 160, TG 108, HDL 43, LDL 97    Review of Systems  Cardiovascular: Positive for claudication. Negative for chest pain, dyspnea on exertion, leg swelling, palpitations and syncope.       Vitals:   07/29/21 0951  BP: 129/69  Pulse: 77  Resp: 16  Temp: 98 F (36.7 C)  SpO2: 96%     Body mass index is 32.88 kg/m. Filed Weights   07/29/21 0951  Weight: 174 lb (78.9 kg)     Objective:    Physical Exam Vitals and nursing note reviewed.  Constitutional:      General: She is not in acute distress. Neck:     Vascular: No JVD.  Cardiovascular:     Rate and Rhythm: Normal rate and regular rhythm.     Pulses: Intact distal pulses.          Femoral pulses are 1+ on the right side and 1+ on the left side.      Dorsalis pedis pulses are 0 on the right side and 1+ on the left side.       Posterior tibial pulses are 0 on the right side and 0 on the left side.     Heart sounds: Normal heart sounds. No murmur heard. Pulmonary:     Effort: Pulmonary effort is normal.     Breath sounds: Normal breath sounds. No wheezing or rales.            Assessment & Recommendations:   60 y/o African-American female with hypertension, type 2 diabetes mellitus, hyperlipidemia, coronary artery disease, PAD, nonischemic cardiomyopathy with now recovered EF, pulmonary hypertension  Hypertension: Controlled  PAD: No critical limb ischemia.  Abnormal ABI Rt 0.68, Lt 0.78 (01/2020). Rutherford class III claudication, controlled with medical therapy. Continue plavix 75 mg daily, Pletal 50 mg bid. Needs aggressive risk factor modification, especially diabetes and hyperlipidemia, see below.  If no improvement, could consider revascularization in future.  Mixed hyperlipidemia: LDL 121 on Crestor 40 mg, and Zetia 10 mg daily.   Started Repatha. Patient would like to get this administered in our office due to fear of needles.   Uncontrolled type 2 DM: Remains extremely uncontrolled with A1C of 12.2%, and inadequately treated-only on metformin. Referred to endocrinology.  Coronary artery disease: No angina symptoms at this time.  Continue medical therapy.  Pulmonary hypertension: Noted on right heart catheterization in 2014, thought to be out of proportion group 2 pulmonary hypertension.  Continue sildenafil, as she has done remarkably well on this medication.  F/u in 3 months  Hollister, MD [facility] Pager: [phone] Office: [phone]

## 2021-07-29 ENCOUNTER — Other Ambulatory Visit: Payer: Self-pay

## 2021-07-29 ENCOUNTER — Encounter: Payer: Self-pay | Admitting: Cardiology

## 2021-07-29 ENCOUNTER — Ambulatory Visit: Payer: Medicare HMO | Admitting: Cardiology

## 2021-07-29 VITALS — BP 129/69 | HR 77 | Temp 98.0°F | Resp 16 | Ht 61.0 in | Wt 174.0 lb

## 2021-07-29 DIAGNOSIS — I10 Essential (primary) hypertension: Secondary | ICD-10-CM | POA: Diagnosis not present

## 2021-07-29 DIAGNOSIS — E782 Mixed hyperlipidemia: Secondary | ICD-10-CM

## 2021-07-29 DIAGNOSIS — I2510 Atherosclerotic heart disease of native coronary artery without angina pectoris: Secondary | ICD-10-CM

## 2021-07-29 DIAGNOSIS — E1165 Type 2 diabetes mellitus with hyperglycemia: Secondary | ICD-10-CM | POA: Diagnosis not present

## 2021-07-29 DIAGNOSIS — I739 Peripheral vascular disease, unspecified: Secondary | ICD-10-CM

## 2021-07-29 MED ORDER — REPATHA 140 MG/ML ~~LOC~~ SOSY: 140.0000 mg | PREFILLED_SYRINGE | SUBCUTANEOUS | 3 refills | Status: DC

## 2021-08-12 ENCOUNTER — Ambulatory Visit: Payer: Medicare HMO | Admitting: Cardiology

## 2021-08-12 ENCOUNTER — Other Ambulatory Visit: Payer: Self-pay

## 2021-08-12 DIAGNOSIS — I739 Peripheral vascular disease, unspecified: Secondary | ICD-10-CM

## 2021-08-12 DIAGNOSIS — E782 Mixed hyperlipidemia: Secondary | ICD-10-CM

## 2021-08-12 MED ORDER — EVOLOCUMAB 140 MG/ML ~~LOC~~ SOAJ
140.0000 mg | Freq: Once | SUBCUTANEOUS | Status: AC
  Administered 2021-08-12: 140 mg via SUBCUTANEOUS

## 2021-08-12 MED ORDER — REPATHA SURECLICK 140 MG/ML ~~LOC~~ SOAJ: 1.0000 mg | SUBCUTANEOUS | 3 refills | Status: DC

## 2021-08-14 NOTE — Progress Notes (Signed)
Chief Complaint  Patient presents with   Hyperlipidemia    Repatha administration    Administration Action Time Recorded Time Documented By Site Comment Reason Patient Supplied  Given : 140 mg :   : Subcutaneous 08/12/21 1000 08/12/21 1009 Obenshine, Tara L, CMA Right Lower Abdomen   No

## 2021-08-17 ENCOUNTER — Other Ambulatory Visit: Payer: Self-pay | Admitting: Pharmacist

## 2021-08-18 ENCOUNTER — Other Ambulatory Visit: Payer: Self-pay

## 2021-08-18 MED ORDER — REPATHA SURECLICK 140 MG/ML ~~LOC~~ SOAJ: 1.0000 mg | SUBCUTANEOUS | 3 refills | Status: DC

## 2021-08-23 DIAGNOSIS — I10 Essential (primary) hypertension: Secondary | ICD-10-CM | POA: Diagnosis not present

## 2021-08-24 DIAGNOSIS — I5032 Chronic diastolic (congestive) heart failure: Secondary | ICD-10-CM | POA: Diagnosis not present

## 2021-08-24 DIAGNOSIS — I10 Essential (primary) hypertension: Secondary | ICD-10-CM | POA: Diagnosis not present

## 2021-08-24 DIAGNOSIS — N1831 Chronic kidney disease, stage 3a: Secondary | ICD-10-CM | POA: Diagnosis not present

## 2021-08-24 DIAGNOSIS — E6609 Other obesity due to excess calories: Secondary | ICD-10-CM | POA: Diagnosis not present

## 2021-08-24 DIAGNOSIS — J4520 Mild intermittent asthma, uncomplicated: Secondary | ICD-10-CM | POA: Diagnosis not present

## 2021-08-24 DIAGNOSIS — E1165 Type 2 diabetes mellitus with hyperglycemia: Secondary | ICD-10-CM | POA: Diagnosis not present

## 2021-08-24 DIAGNOSIS — E7800 Pure hypercholesterolemia, unspecified: Secondary | ICD-10-CM | POA: Diagnosis not present

## 2021-08-26 ENCOUNTER — Other Ambulatory Visit: Payer: Self-pay

## 2021-08-26 ENCOUNTER — Ambulatory Visit: Payer: Medicare HMO | Admitting: Cardiology

## 2021-08-26 DIAGNOSIS — E782 Mixed hyperlipidemia: Secondary | ICD-10-CM

## 2021-08-26 MED ORDER — EVOLOCUMAB 140 MG/ML ~~LOC~~ SOAJ
140.0000 mg | Freq: Once | SUBCUTANEOUS | Status: AC
  Administered 2021-08-26: 140 mg via SUBCUTANEOUS

## 2021-08-26 NOTE — Progress Notes (Signed)
Chief Complaint  Patient presents with   Hyperlipidemia    Repatha injection      ICD-10-CM   1. Mixed hyperlipidemia  E78.2 Evolocumab SOAJ 140 mg     Administration Action Time Recorded Time Documented By Site Comment Reason Patient Supplied  Given : 140 mg :   : Subcutaneous 08/26/21 0907 08/26/21 0908 Mares, Lesley Left Lower Abdomen   No

## 2021-09-03 ENCOUNTER — Ambulatory Visit: Payer: Medicare HMO | Admitting: Cardiology

## 2021-09-09 ENCOUNTER — Ambulatory Visit: Payer: Medicare HMO | Admitting: Cardiology

## 2021-09-09 ENCOUNTER — Other Ambulatory Visit: Payer: Self-pay

## 2021-09-09 DIAGNOSIS — I2510 Atherosclerotic heart disease of native coronary artery without angina pectoris: Secondary | ICD-10-CM

## 2021-09-09 DIAGNOSIS — E782 Mixed hyperlipidemia: Secondary | ICD-10-CM | POA: Diagnosis not present

## 2021-09-09 MED ORDER — EVOLOCUMAB 140 MG/ML ~~LOC~~ SOAJ
140.0000 mg | Freq: Once | SUBCUTANEOUS | Status: AC
  Administered 2021-09-09: 140 mg via SUBCUTANEOUS

## 2021-09-09 NOTE — Progress Notes (Signed)
ICD-10-CM   1. Coronary artery disease involving native coronary artery of native heart without angina pectoris  I25.10     2. Mixed hyperlipidemia  E78.2 Evolocumab SOAJ 140 mg     Action Time Recorded Time Documented By Site Comment Reason Patient Supplied   Given : 140 mg :   : Subcutaneous 09/09/21 1410 09/09/21 0924 Dodd, Amber L Right Lower Abdomen   No

## 2021-09-22 DIAGNOSIS — I10 Essential (primary) hypertension: Secondary | ICD-10-CM | POA: Diagnosis not present

## 2021-09-23 ENCOUNTER — Other Ambulatory Visit: Payer: Self-pay

## 2021-09-23 ENCOUNTER — Encounter: Payer: Medicare HMO | Admitting: Cardiology

## 2021-09-23 DIAGNOSIS — E782 Mixed hyperlipidemia: Secondary | ICD-10-CM

## 2021-09-23 DIAGNOSIS — I5032 Chronic diastolic (congestive) heart failure: Secondary | ICD-10-CM | POA: Diagnosis not present

## 2021-09-23 DIAGNOSIS — I10 Essential (primary) hypertension: Secondary | ICD-10-CM | POA: Diagnosis not present

## 2021-09-23 DIAGNOSIS — E6609 Other obesity due to excess calories: Secondary | ICD-10-CM | POA: Diagnosis not present

## 2021-09-23 DIAGNOSIS — E1165 Type 2 diabetes mellitus with hyperglycemia: Secondary | ICD-10-CM | POA: Diagnosis not present

## 2021-09-23 DIAGNOSIS — E7800 Pure hypercholesterolemia, unspecified: Secondary | ICD-10-CM | POA: Diagnosis not present

## 2021-09-23 DIAGNOSIS — J4520 Mild intermittent asthma, uncomplicated: Secondary | ICD-10-CM | POA: Diagnosis not present

## 2021-09-23 DIAGNOSIS — N1831 Chronic kidney disease, stage 3a: Secondary | ICD-10-CM | POA: Diagnosis not present

## 2021-09-23 MED ORDER — EVOLOCUMAB 140 MG/ML ~~LOC~~ SOAJ
140.0000 mg | Freq: Once | SUBCUTANEOUS | Status: AC
Start: 2021-09-23 — End: 2021-09-23
  Administered 2021-09-23: 140 mg via SUBCUTANEOUS

## 2021-10-07 ENCOUNTER — Ambulatory Visit: Payer: Medicare HMO | Admitting: Student

## 2021-10-07 ENCOUNTER — Other Ambulatory Visit: Payer: Self-pay

## 2021-10-07 DIAGNOSIS — E782 Mixed hyperlipidemia: Secondary | ICD-10-CM | POA: Diagnosis not present

## 2021-10-07 MED ORDER — EVOLOCUMAB 140 MG/ML ~~LOC~~ SOAJ
140.0000 mg | Freq: Once | SUBCUTANEOUS | Status: AC
Start: 2021-10-07 — End: 2021-10-07
  Administered 2021-10-07: 140 mg via SUBCUTANEOUS

## 2021-10-07 NOTE — Progress Notes (Signed)
Evolocumab SOAJ 140 mg   [[id number]]  Ordered Dose: 140 mg Route: Subcutaneous Frequency:  Once  Admin Dose: --     Scheduled Start Date/Time: 10/07/21 0915 End Date/Time: 10/07/21 0911 after 1 doses        Diagnosis Association: Mixed hyperlipidemia (E78.2)    Order Status: Completed Thu Oct 07, 2021 0911, originally scheduled to end    NF Ordered: Yes NF post-verification: No Non-Formulary Code: Acceptable to use formulary alternative  Ordering User: Damita Lack Ordering Date/Time: Thu Oct 07, 2021 0911  Ordering Provider: Nigel Mormon, MD Authorizing Provider: Nigel Mormon, MD     ICD-10-CM   1. Mixed hyperlipidemia  E78.2 Evolocumab SOAJ 140 mg      Alethia Berthold, PA-C 10/07/2021, 2:12 PM Office: [phone]

## 2021-10-18 ENCOUNTER — Encounter: Payer: Self-pay | Admitting: Pharmacist

## 2021-10-18 NOTE — Progress Notes (Signed)
CARE PLAN ENTRY  10/18/2021 Name: [name] MRN: [id number] DOB: 04-19-1961  [facility] is enrolled in Remote Patient Monitoring/Principle Care Monitoring.  Date of Enrollment: 05/29/21 Supervising physician: Vernell Leep Indication: HTN  Remote Readings: Compliant and Avg BP: 123/70, HR:79  Next scheduled OV: 11/04/21  Pharmacist Clinical Goal(s):  Over the next 90 days, patient will demonstrate Improved medication adherence as evidenced by medication fill history Over the next 90 days, patient will demonstrate improved understanding of prescribed medications and rationale for usage as evidenced by patient teach back Over the next 90 days, patient will experience decrease in ED visits. ED visits in last 6 months = 0 Over the next 90 days, patient will not experience hospital admission. Hospital Admissions in last 6 months = 0  Interventions: Provider and Inter-disciplinary care team collaboration (see longitudinal plan of care) Comprehensive medication review performed. Discussed plans with patient for ongoing care management follow up and provided patient with direct contact information for care management team Collaboration with provider re: medication management  Patient Self Care Activities:  Self administers medications as prescribed Attends all scheduled provider appointments Performs ADL's independently Performs IADL's independently  No Known Allergies Outpatient Encounter Medications as of 10/18/2021  Medication Sig   acetaminophen (TYLENOL) 325 MG tablet Take 325-650 mg by mouth every 6 (six) hours as needed for mild pain (depends on pain).   albuterol (PROVENTIL HFA;VENTOLIN HFA) 108 (90 BASE) MCG/ACT inhaler Inhale 2 puffs into the lungs every 4 (four) hours as needed for wheezing or shortness of breath.   carvedilol (COREG) 12.5 MG tablet TAKE 1 TABLET BY MOUTH 2 TIMES DAILY WITH A MEAL. (Patient taking differently: Take 12.5 mg by mouth 2 (two)  times daily with a meal.)   Cholecalciferol (VITAMIN D PO) Take 125 mcg by mouth in the morning and at bedtime.    cilostazol (PLETAL) 50 MG tablet Take 1 tablet (50 mg total) by mouth 2 (two) times daily.   clopidogrel (PLAVIX) 75 MG tablet Take 1 tablet (75 mg total) by mouth daily.   Evolocumab (REPATHA SURECLICK) 672 MG/ML SOAJ Inject 1 mg into the skin every 14 (fourteen) days.   ezetimibe (ZETIA) 10 MG tablet Take 10 mg by mouth daily.   ferrous sulfate 325 (65 FE) MG tablet Take 325 mg by mouth daily with breakfast.   furosemide (LASIX) 40 MG tablet Take 1 tablet (40 mg total) by mouth daily. (Patient taking differently: Take 20 mg by mouth 2 (two) times daily.)   glipiZIDE (GLUCOTROL) 5 MG tablet Take 5 mg by mouth 2 (two) times daily before a meal. (Patient not taking: Reported on 07/29/2021)   lisinopril (ZESTRIL) 10 MG tablet Take 10 mg by mouth daily.    metFORMIN (GLUCOPHAGE) 1000 MG tablet Take 1,000 mg by mouth 2 (two) times daily.   rosuvastatin (CRESTOR) 40 MG tablet Take 1 tablet (40 mg total) by mouth at bedtime.   spironolactone (ALDACTONE) 25 MG tablet Take 25 mg by mouth daily.   No facility-administered encounter medications on file as of 10/18/2021.    Hypertension   BP goal is:  <130/80  Office blood pressures are  BP Readings from Last 3 Encounters:  07/29/21 129/69  07/01/21 (!) 165/75  01/01/21 118/72    Patient is currently controlled on the following medications: Lisinopril 10 mg, spironolactone 25 mg, lasix 40 mg, carvedilol 12.5 mg BID.   Patient checks BP at home daily  Patient home BP readings are ranging: 92-147/57-89  Patient has  tried  these meds in the past: amlodipine, isordil,   We discussed diet and exercise extensively  Plan  Continue current medications and control with diet and exercise   BP remains stable and controlled on current medication therapy. Pt continues to get Repatha and hasnt any any ADR associated with the injection.  Pending follow up lipid panel. Will continue current therapy and continue monitoring.  ______________ Visit Information SDOH (Social Determinants of Health) assessments performed: Yes.  [name] was given information about Principle Care Management/Remote Patient Monitoring services today including:  RPM/PCM service includes personalized support from designated clinical staff supervised by her physician, including individualized plan of care and coordination with other care providers 24/7 contact phone numbers for assistance for urgent and routine care needs. Standard insurance, coinsurance, copays and deductibles apply for principle care management only during months in which we provide at least 30 minutes of these services. Most insurances cover these services at 100%, however patients may be responsible for any copay, coinsurance and/or deductible if applicable. This service may help you avoid the need for more expensive face-to-face services. Only one practitioner may furnish and bill the service in a calendar month. The patient may stop PCM/RPM services at any time (effective at the end of the month) by phone call to the office staff.  Patient agreed to services and verbal consent obtained.   Manuela Schwartz, Pharm.D. [facility] [phone] Ext: [phone]

## 2021-10-21 ENCOUNTER — Encounter: Payer: Medicare HMO | Admitting: Cardiology

## 2021-10-21 ENCOUNTER — Other Ambulatory Visit: Payer: Self-pay

## 2021-10-21 DIAGNOSIS — E782 Mixed hyperlipidemia: Secondary | ICD-10-CM

## 2021-10-22 MED ORDER — EVOLOCUMAB 140 MG/ML ~~LOC~~ SOAJ
140.0000 mg | Freq: Once | SUBCUTANEOUS | Status: AC
  Administered 2021-10-21: 140 mg via SUBCUTANEOUS

## 2021-10-23 DIAGNOSIS — I10 Essential (primary) hypertension: Secondary | ICD-10-CM | POA: Diagnosis not present

## 2021-10-27 ENCOUNTER — Other Ambulatory Visit (HOSPITAL_COMMUNITY): Payer: Self-pay | Admitting: Cardiology

## 2021-10-27 DIAGNOSIS — E782 Mixed hyperlipidemia: Secondary | ICD-10-CM | POA: Diagnosis not present

## 2021-10-27 DIAGNOSIS — I739 Peripheral vascular disease, unspecified: Secondary | ICD-10-CM | POA: Diagnosis not present

## 2021-10-28 LAB — LIPID PANEL
Chol/HDL Ratio: 1.9 ratio (ref 0.0–4.4)
Cholesterol, Total: 88 mg/dL — ABNORMAL LOW (ref 100–199)
HDL: 46 mg/dL (ref >39)
LDL Chol Calc (NIH): 20 mg/dL (ref 0–99)
Triglycerides: 121 mg/dL (ref 0–149)
VLDL Cholesterol Cal: 22 mg/dL (ref 5–40)

## 2021-11-04 ENCOUNTER — Other Ambulatory Visit: Payer: Self-pay

## 2021-11-04 ENCOUNTER — Ambulatory Visit: Payer: Medicare HMO | Admitting: Cardiology

## 2021-11-04 ENCOUNTER — Encounter: Payer: Self-pay | Admitting: Cardiology

## 2021-11-04 VITALS — BP 129/77 | HR 77 | Temp 98.0°F | Resp 16 | Ht 61.0 in | Wt 175.0 lb

## 2021-11-04 DIAGNOSIS — I2510 Atherosclerotic heart disease of native coronary artery without angina pectoris: Secondary | ICD-10-CM | POA: Diagnosis not present

## 2021-11-04 DIAGNOSIS — I10 Essential (primary) hypertension: Secondary | ICD-10-CM | POA: Diagnosis not present

## 2021-11-04 DIAGNOSIS — I739 Peripheral vascular disease, unspecified: Secondary | ICD-10-CM

## 2021-11-04 DIAGNOSIS — E782 Mixed hyperlipidemia: Secondary | ICD-10-CM | POA: Insufficient documentation

## 2021-11-04 DIAGNOSIS — E1165 Type 2 diabetes mellitus with hyperglycemia: Secondary | ICD-10-CM

## 2021-11-04 MED ORDER — EVOLOCUMAB 140 MG/ML ~~LOC~~ SOAJ
140.0000 mg | Freq: Once | SUBCUTANEOUS | Status: AC
  Administered 2021-11-04: 11:00:00 140 mg via SUBCUTANEOUS

## 2021-11-04 NOTE — Progress Notes (Signed)
Patient referred by Benito Mccreedy, MD for abnormal ABI  Subjective:   [facility], female    DOB: 10-25-1961, 60 y.o.   MRN: [id number]   Chief Complaint  Patient presents with   PAD   Hyperlipidemia   Follow-up    HPI  60 y/o African-American female with hypertension, type 2 diabetes mellitus, hyperlipidemia, coronary artery disease, PAD, nonischemic cardiomyopathy with now recovered EF, pulmonary hypertension  LDL has significantly decreased on Repatha, details below. Claudication is improving. She denies rest pain.  Current Outpatient Medications on File Prior to Visit  Medication Sig Dispense Refill   acetaminophen (TYLENOL) 325 MG tablet Take 325-650 mg by mouth every 6 (six) hours as needed for mild pain (depends on pain).     albuterol (PROVENTIL HFA;VENTOLIN HFA) 108 (90 BASE) MCG/ACT inhaler Inhale 2 puffs into the lungs every 4 (four) hours as needed for wheezing or shortness of breath.     carvedilol (COREG) 12.5 MG tablet TAKE 1 TABLET BY MOUTH 2 TIMES DAILY WITH A MEAL. (Patient taking differently: Take 12.5 mg by mouth 2 (two) times daily with a meal.) 60 tablet 11   Cholecalciferol (VITAMIN D PO) Take 125 mcg by mouth in the morning and at bedtime.      cilostazol (PLETAL) 50 MG tablet Take 1 tablet (50 mg total) by mouth 2 (two) times daily. 60 tablet 2   clopidogrel (PLAVIX) 75 MG tablet Take 1 tablet (75 mg total) by mouth daily. 90 tablet 3   Evolocumab (REPATHA SURECLICK) 916 MG/ML SOAJ Inject 1 mg into the skin every 14 (fourteen) days. 2 mL 3   ezetimibe (ZETIA) 10 MG tablet Take 10 mg by mouth daily.     ferrous sulfate 325 (65 FE) MG tablet Take 325 mg by mouth daily with breakfast.     furosemide (LASIX) 40 MG tablet Take 1 tablet (40 mg total) by mouth daily. (Patient taking differently: Take 20 mg by mouth 2 (two) times daily.) 30 tablet 11   glipiZIDE (GLUCOTROL) 5 MG tablet Take 5 mg by mouth 2 (two) times daily before a meal. (Patient not  taking: Reported on 07/29/2021)     lisinopril (ZESTRIL) 10 MG tablet Take 10 mg by mouth daily.      metFORMIN (GLUCOPHAGE) 1000 MG tablet Take 1,000 mg by mouth 2 (two) times daily.     rosuvastatin (CRESTOR) 40 MG tablet Take 1 tablet (40 mg total) by mouth at bedtime. 30 tablet 11   spironolactone (ALDACTONE) 25 MG tablet Take 25 mg by mouth daily.     No current facility-administered medications on file prior to visit.    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 Left ventricle cavity  is normal in size. Moderate left ventricular hypertrophy. Normal global  wall motion. No obvious regional wall motion abnormalities. Doppler  evidence of grade I diastolic dysfunction, elevated LAP. Calculated EF  55%.  No significant valvular abnormalities.  IVC is normal with a respiratory response of <50%.  No significant change compared to prior study dated 11/2015 except Grade I  diastolic dysfunction is new.   ABI 02/06/2020: Rt 0.68 Lt 0.88  LHC/RHC 06/2013: LM: No significant disease LAD: 50% mid LAD stenosis with distal tapering.  LCx: Moderate early OM1 with 50% ostial stenosis. Mid LCx with 40% stenosis.  RCA: Diffuse  RCA disease with 70-80% ostial stenosis and damping of the waveform; serial 50% stenoses in the mid and distal RCA.  Reviewed with Dr. Angelena Form.  70-80% stenosis with ostial damping in setting of relatively small caliber RCA.  The RCA is diffusely disease.  In the absence of ACS (no chest pain), would treat medically  Left ventriculography: Global hypokinesis with EF estimated at 35%.  Optimized PCWP on current dose of Lasix (16 mmHg).   Pulmonary arterial HTN with elevated PVR, not responsive to adenosine.  Given co-existing LV dysfunction, I am going to try her on sildenafil 20 mg tid.    She will followup in clinic in about 2 wks.   [place] 05/2013: Mean RA:  20  mmHg RV: 76/12 mmHg PA: 75/36 mmHg with a mean of 49 mmHg PCWP: 27 mmHg   Ao sat : 94% PA sat 50%    Fick Cardiac Output:  3.1 L/minute,  1.8 L/min minute INR INR ute/M2   PVR: 8.0 wood unites 567 dysnes-sec cm5   Impression:  PAD significantly higher than PCWP indicating some intrinsic pulmonary hypertension.  Increase diuretics and add nitrates Refer to CHF clinic and continue f/u with pulmonary ? [place]  Recent labs: 10/27/2021: Chol 88, TG 121, HDL 46, LDL 20  07/01/2021: Chol 189, TG 160, HDL 39, LDL 121 HbA1C 12.2%  01/01/2021: HbA1C 11.8%  11/20/2020: Glucose 413, BUN/Cr 10/1.15. EGFR 54. Na/K 138/3.8. T.bili 1.6. Rest of the CMP normal H/H 13/40. MCV 86. Platelets 380  01/09/2020: Glucose 150, BUN/Cr 8/0.89. EGFR 81. Na/K 144/4.  Rest of the CMP normal H/H 12.0/36.5. MCV 85. Platelets 405 HbA1C 6.9% Chol 160, TG 108, HDL 43, LDL 97    Review of Systems  Cardiovascular: Positive for claudication. Negative for chest pain, dyspnea on exertion, leg swelling, palpitations and syncope.       Vitals:   11/04/21 1019  BP: 129/77  Pulse: 77  Resp: 16  Temp: 98 F (36.7 C)  SpO2: 96%     Body mass index is 33.07 kg/m. Filed Weights   11/04/21 1019  Weight: 175 lb (79.4 kg)     Objective:    Physical Exam Vitals and nursing note reviewed.  Constitutional:      General: She is not in acute distress. Neck:     Vascular: No JVD.  Cardiovascular:     Rate and Rhythm: Normal rate and regular rhythm.     Pulses: Intact distal pulses.          Femoral pulses are 1+ on the right side and 1+ on the left side.      Dorsalis pedis pulses are 0 on the right side and 1+ on the left side.       Posterior tibial pulses are 0 on the right side and 0 on the left side.     Heart sounds: Normal heart sounds. No murmur heard. Pulmonary:     Effort: Pulmonary effort is normal.     Breath  sounds: Normal breath sounds. No wheezing or rales.           Assessment & Recommendations:   60 y/o African-American female with hypertension, type 2 diabetes mellitus, hyperlipidemia, coronary artery disease, PAD, nonischemic cardiomyopathy with now recovered EF, pulmonary hypertension  Hypertension: Controlled  PAD: No critical limb ischemia.  Abnormal ABI Rt 0.68, Lt 0.78 (01/2020). Rutherford class III claudication, controlled with medical therapy. Continue plavix 75 mg daily, Pletal 50 mg bid. Needs aggressive risk factor modification, especially  diabetes and hyperlipidemia, see below.   If no improvement, could consider revascularization in future.  Mixed hyperlipidemia: LDL down to 21 on Repatha Stopped Zetia. Continue Crestor.   Uncontrolled type 2 DM: Remains extremely uncontrolled with A1C of 12.2%, and inadequately treated-only on metformin. Referred to endocrinology.  Coronary artery disease: No angina symptoms at this time.  Continue medical therapy.  Pulmonary hypertension: Noted on right heart catheterization in 2014, thought to be out of proportion group 2 pulmonary hypertension.  Continue sildenafil, as she has done remarkably well on this medication.  F/u in 6 months  Nikoloz Huy Esther Hardy, MD [facility] Pager: [phone] Office: [phone]

## 2021-11-19 ENCOUNTER — Other Ambulatory Visit: Payer: Self-pay

## 2021-11-19 ENCOUNTER — Ambulatory Visit: Payer: Medicare HMO | Admitting: Cardiology

## 2021-11-19 DIAGNOSIS — E782 Mixed hyperlipidemia: Secondary | ICD-10-CM

## 2021-11-19 MED ORDER — EVOLOCUMAB 140 MG/ML ~~LOC~~ SOAJ
140.0000 mg | Freq: Once | SUBCUTANEOUS | Status: AC
  Administered 2021-11-19: 140 mg via SUBCUTANEOUS

## 2021-11-19 NOTE — Progress Notes (Signed)
Administration Action Time Recorded Time Documented By Site Comment Reason Patient Supplied Given : 140 mg :   : Subcutaneous 11/19/21 1034 11/19/21 1035 Mares, Lesley Right Lower Abdomen   No     Evolocumab SOAJ 140 mg   [[id number]]  Ordered Dose: 140 mg Route: Subcutaneous Frequency:  Once Admin Dose: --    Scheduled Start Date/Time: 11/19/21 1045 End Date/Time: 11/19/21 1034 after 1 doses    Adrian Prows, MD, [facility] 11/19/2021, 4:06 PM Office: [phone] Fax: [phone] Pager: [phone]

## 2021-11-22 DIAGNOSIS — I10 Essential (primary) hypertension: Secondary | ICD-10-CM | POA: Diagnosis not present

## 2021-12-03 ENCOUNTER — Other Ambulatory Visit: Payer: Self-pay

## 2021-12-03 ENCOUNTER — Ambulatory Visit: Payer: Medicare HMO | Admitting: Cardiology

## 2021-12-03 DIAGNOSIS — E782 Mixed hyperlipidemia: Secondary | ICD-10-CM | POA: Diagnosis not present

## 2021-12-03 DIAGNOSIS — I2510 Atherosclerotic heart disease of native coronary artery without angina pectoris: Secondary | ICD-10-CM | POA: Diagnosis not present

## 2021-12-03 MED ORDER — EVOLOCUMAB 140 MG/ML ~~LOC~~ SOAJ
140.0000 mg | Freq: Once | SUBCUTANEOUS | Status: AC
Start: 2021-12-03 — End: 2021-12-03
  Administered 2021-12-03: 140 mg via SUBCUTANEOUS

## 2021-12-03 NOTE — Progress Notes (Signed)
No chief complaint on file.  Administration Action Time Recorded Time Documented By Site Comment Reason Patient Supplied  Given : 140 mg :   : Subcutaneous 12/03/21 0931 12/03/21 0933 Dodd, Amber L Left Lower Abdomen   No     ICD-10-CM   1. Coronary artery disease involving native coronary artery of native heart without angina pectoris  I25.10 Evolocumab SOAJ 140 mg    2. Mixed hyperlipidemia  E78.2 Evolocumab SOAJ 140 mg

## 2021-12-17 ENCOUNTER — Ambulatory Visit: Payer: Medicare HMO | Admitting: Cardiology

## 2021-12-17 ENCOUNTER — Other Ambulatory Visit: Payer: Self-pay

## 2021-12-17 DIAGNOSIS — E782 Mixed hyperlipidemia: Secondary | ICD-10-CM | POA: Diagnosis not present

## 2021-12-17 DIAGNOSIS — I2510 Atherosclerotic heart disease of native coronary artery without angina pectoris: Secondary | ICD-10-CM | POA: Diagnosis not present

## 2021-12-17 MED ORDER — EVOLOCUMAB 140 MG/ML ~~LOC~~ SOAJ
140.0000 mg | Freq: Once | SUBCUTANEOUS | Status: AC
  Administered 2022-01-28: 140 mg via SUBCUTANEOUS

## 2021-12-17 NOTE — Progress Notes (Signed)
Chief Complaint  Patient presents with   Coronary Artery Disease   Hyperlipidemia      ICD-10-CM   1. Coronary artery disease involving native coronary artery of native heart without angina pectoris  I25.10 Evolocumab SOAJ 140 mg    2. Mixed hyperlipidemia  E78.2 Evolocumab SOAJ 140 mg      Adrian Prows, MD, [facility] 12/17/2021, 2:18 PM Office: [phone] Fax: [phone] Pager: [phone]

## 2021-12-23 DIAGNOSIS — I10 Essential (primary) hypertension: Secondary | ICD-10-CM | POA: Diagnosis not present

## 2021-12-23 DIAGNOSIS — Z90710 Acquired absence of both cervix and uterus: Secondary | ICD-10-CM | POA: Diagnosis not present

## 2021-12-23 DIAGNOSIS — Z780 Asymptomatic menopausal state: Secondary | ICD-10-CM | POA: Diagnosis not present

## 2021-12-23 DIAGNOSIS — E119 Type 2 diabetes mellitus without complications: Secondary | ICD-10-CM | POA: Diagnosis not present

## 2021-12-23 DIAGNOSIS — Z01419 Encounter for gynecological examination (general) (routine) without abnormal findings: Secondary | ICD-10-CM | POA: Diagnosis not present

## 2021-12-23 DIAGNOSIS — I509 Heart failure, unspecified: Secondary | ICD-10-CM | POA: Diagnosis not present

## 2021-12-24 DIAGNOSIS — J4520 Mild intermittent asthma, uncomplicated: Secondary | ICD-10-CM | POA: Diagnosis not present

## 2021-12-24 DIAGNOSIS — I10 Essential (primary) hypertension: Secondary | ICD-10-CM | POA: Diagnosis not present

## 2021-12-24 DIAGNOSIS — N1831 Chronic kidney disease, stage 3a: Secondary | ICD-10-CM | POA: Diagnosis not present

## 2021-12-24 DIAGNOSIS — E7800 Pure hypercholesterolemia, unspecified: Secondary | ICD-10-CM | POA: Diagnosis not present

## 2021-12-24 DIAGNOSIS — E1165 Type 2 diabetes mellitus with hyperglycemia: Secondary | ICD-10-CM | POA: Diagnosis not present

## 2021-12-24 DIAGNOSIS — I5032 Chronic diastolic (congestive) heart failure: Secondary | ICD-10-CM | POA: Diagnosis not present

## 2021-12-24 DIAGNOSIS — E6609 Other obesity due to excess calories: Secondary | ICD-10-CM | POA: Diagnosis not present

## 2021-12-31 ENCOUNTER — Other Ambulatory Visit: Payer: Self-pay

## 2021-12-31 ENCOUNTER — Ambulatory Visit: Payer: Medicare HMO | Admitting: Cardiology

## 2021-12-31 DIAGNOSIS — I2510 Atherosclerotic heart disease of native coronary artery without angina pectoris: Secondary | ICD-10-CM | POA: Diagnosis not present

## 2021-12-31 DIAGNOSIS — E782 Mixed hyperlipidemia: Secondary | ICD-10-CM

## 2021-12-31 MED ORDER — EVOLOCUMAB 140 MG/ML ~~LOC~~ SOAJ
140.0000 mg | Freq: Once | SUBCUTANEOUS | Status: AC
  Administered 2021-12-31: 140 mg via SUBCUTANEOUS

## 2021-12-31 NOTE — Progress Notes (Signed)
Given : 140 mg :   : Subcutaneous 12/31/21 0826 12/31/21 0830 Obenshine, Tara L, CMA Left Lower Abdomen   Yes     ICD-10-CM   1. Coronary artery disease involving native coronary artery of native heart without angina pectoris  I25.10 Evolocumab SOAJ 140 mg    2. Mixed hyperlipidemia  E78.2 Evolocumab SOAJ 140 mg

## 2022-01-04 ENCOUNTER — Other Ambulatory Visit: Payer: Self-pay

## 2022-01-04 ENCOUNTER — Other Ambulatory Visit: Payer: Self-pay | Admitting: Cardiology

## 2022-01-04 MED ORDER — REPATHA SURECLICK 140 MG/ML ~~LOC~~ SOAJ: 1.0000 mg | SUBCUTANEOUS | 3 refills | Status: DC

## 2022-01-05 ENCOUNTER — Other Ambulatory Visit: Payer: Self-pay

## 2022-01-05 MED ORDER — REPATHA SURECLICK 140 MG/ML ~~LOC~~ SOAJ: 1.0000 mL | SUBCUTANEOUS | 3 refills | Status: DC

## 2022-01-14 ENCOUNTER — Ambulatory Visit: Payer: Medicare HMO | Admitting: Cardiology

## 2022-01-14 ENCOUNTER — Other Ambulatory Visit: Payer: Self-pay

## 2022-01-14 DIAGNOSIS — E782 Mixed hyperlipidemia: Secondary | ICD-10-CM | POA: Diagnosis not present

## 2022-01-14 DIAGNOSIS — I2510 Atherosclerotic heart disease of native coronary artery without angina pectoris: Secondary | ICD-10-CM | POA: Diagnosis not present

## 2022-01-14 MED ORDER — EVOLOCUMAB 140 MG/ML ~~LOC~~ SOAJ
140.0000 mg | Freq: Once | SUBCUTANEOUS | Status: AC
  Administered 2022-01-14: 140 mg via SUBCUTANEOUS

## 2022-01-15 NOTE — Progress Notes (Signed)
Administration Action Time Recorded Time Documented By Site Comment Reason Patient Supplied  Given : 140 mg :   : Subcutaneous 01/14/22 0839 01/14/22 0840 Dodd, Amber L Right Lower Abdomen   No     ICD-10-CM   1. Mixed hyperlipidemia  E78.2 Evolocumab SOAJ 140 mg    2. Coronary artery disease involving native coronary artery of native heart without angina pectoris  I25.10 Evolocumab SOAJ 140 mg

## 2022-01-23 DIAGNOSIS — I10 Essential (primary) hypertension: Secondary | ICD-10-CM | POA: Diagnosis not present

## 2022-01-28 ENCOUNTER — Encounter: Payer: Medicare HMO | Admitting: Cardiology

## 2022-01-28 ENCOUNTER — Other Ambulatory Visit: Payer: Self-pay

## 2022-01-28 DIAGNOSIS — E1142 Type 2 diabetes mellitus with diabetic polyneuropathy: Secondary | ICD-10-CM | POA: Diagnosis not present

## 2022-01-28 DIAGNOSIS — M792 Neuralgia and neuritis, unspecified: Secondary | ICD-10-CM | POA: Diagnosis not present

## 2022-01-28 DIAGNOSIS — I739 Peripheral vascular disease, unspecified: Secondary | ICD-10-CM | POA: Diagnosis not present

## 2022-01-28 DIAGNOSIS — E782 Mixed hyperlipidemia: Secondary | ICD-10-CM

## 2022-02-11 ENCOUNTER — Other Ambulatory Visit: Payer: Self-pay

## 2022-02-11 ENCOUNTER — Ambulatory Visit: Payer: Medicare HMO | Admitting: Cardiology

## 2022-02-11 DIAGNOSIS — E782 Mixed hyperlipidemia: Secondary | ICD-10-CM

## 2022-02-11 MED ORDER — EVOLOCUMAB 140 MG/ML ~~LOC~~ SOAJ
140.0000 mg | Freq: Once | SUBCUTANEOUS | Status: AC
  Administered 2022-02-11: 140 mg via SUBCUTANEOUS

## 2022-02-11 NOTE — Progress Notes (Signed)
ICD-10-CM   1. Mixed hyperlipidemia  E78.2 Evolocumab SOAJ 140 mg     Administration Action Time Recorded Time Documented By Site Comment Reason Patient Supplied  Given : 140 mg :   : Subcutaneous 02/11/22 0905 02/11/22 0906 Gaye Alken, CMA Right Lower Abdomen   Yes

## 2022-02-18 DIAGNOSIS — Z1231 Encounter for screening mammogram for malignant neoplasm of breast: Secondary | ICD-10-CM | POA: Diagnosis not present

## 2022-02-18 DIAGNOSIS — Z780 Asymptomatic menopausal state: Secondary | ICD-10-CM | POA: Diagnosis not present

## 2022-02-22 DIAGNOSIS — E119 Type 2 diabetes mellitus without complications: Secondary | ICD-10-CM | POA: Diagnosis not present

## 2022-02-22 DIAGNOSIS — H1045 Other chronic allergic conjunctivitis: Secondary | ICD-10-CM | POA: Diagnosis not present

## 2022-02-22 DIAGNOSIS — H04123 Dry eye syndrome of bilateral lacrimal glands: Secondary | ICD-10-CM | POA: Diagnosis not present

## 2022-02-22 DIAGNOSIS — H2513 Age-related nuclear cataract, bilateral: Secondary | ICD-10-CM | POA: Diagnosis not present

## 2022-02-22 DIAGNOSIS — I10 Essential (primary) hypertension: Secondary | ICD-10-CM | POA: Diagnosis not present

## 2022-02-22 DIAGNOSIS — H35033 Hypertensive retinopathy, bilateral: Secondary | ICD-10-CM | POA: Diagnosis not present

## 2022-02-22 DIAGNOSIS — H18603 Keratoconus, unspecified, bilateral: Secondary | ICD-10-CM | POA: Diagnosis not present

## 2022-02-25 ENCOUNTER — Ambulatory Visit: Payer: Medicare HMO | Admitting: Cardiology

## 2022-02-25 ENCOUNTER — Other Ambulatory Visit: Payer: Self-pay

## 2022-02-25 DIAGNOSIS — E782 Mixed hyperlipidemia: Secondary | ICD-10-CM | POA: Diagnosis not present

## 2022-02-25 MED ORDER — EVOLOCUMAB 140 MG/ML ~~LOC~~ SOAJ
140.0000 mg | Freq: Once | SUBCUTANEOUS | Status: AC
  Administered 2022-02-25: 140 mg via SUBCUTANEOUS

## 2022-02-26 NOTE — Progress Notes (Signed)
ICD-10-CM   ?1. Mixed hyperlipidemia  E78.2 Evolocumab SOAJ 140 mg  ?  ? ?Administration Action Time Recorded Time Documented By Site Comment Reason Patient Supplied  ?Given : 140 mg :   : Subcutaneous 02/25/22 0939 02/25/22 0941 Dodd, Amber L Left Lower Abdomen   No  ? ?

## 2022-03-07 ENCOUNTER — Encounter: Payer: Self-pay | Admitting: Internal Medicine

## 2022-03-07 ENCOUNTER — Other Ambulatory Visit: Payer: Self-pay

## 2022-03-07 ENCOUNTER — Ambulatory Visit (INDEPENDENT_AMBULATORY_CARE_PROVIDER_SITE_OTHER): Payer: Medicare HMO | Admitting: Internal Medicine

## 2022-03-07 VITALS — BP 126/72 | HR 74 | Ht 61.0 in | Wt 179.0 lb

## 2022-03-07 DIAGNOSIS — E1142 Type 2 diabetes mellitus with diabetic polyneuropathy: Secondary | ICD-10-CM

## 2022-03-07 DIAGNOSIS — E1165 Type 2 diabetes mellitus with hyperglycemia: Secondary | ICD-10-CM | POA: Diagnosis not present

## 2022-03-07 DIAGNOSIS — E1159 Type 2 diabetes mellitus with other circulatory complications: Secondary | ICD-10-CM | POA: Diagnosis not present

## 2022-03-07 DIAGNOSIS — E119 Type 2 diabetes mellitus without complications: Secondary | ICD-10-CM | POA: Insufficient documentation

## 2022-03-07 DIAGNOSIS — R739 Hyperglycemia, unspecified: Secondary | ICD-10-CM

## 2022-03-07 LAB — BASIC METABOLIC PANEL
BUN: 12 mg/dL (ref 6–23)
CO2: 29 mEq/L (ref 19–32)
Calcium: 10 mg/dL (ref 8.4–10.5)
Chloride: 99 mEq/L (ref 96–112)
Creatinine, Ser: 1.25 mg/dL — ABNORMAL HIGH (ref 0.40–1.20)
GFR: 46.19 mL/min — ABNORMAL LOW (ref >60.00)
Glucose, Bld: 332 mg/dL — ABNORMAL HIGH (ref 70–99)
Potassium: 4.4 mEq/L (ref 3.5–5.1)
Sodium: 138 mEq/L (ref 135–145)

## 2022-03-07 LAB — POCT GLUCOSE (DEVICE FOR HOME USE): Glucose Fasting, POC: 331 mg/dL — AB (ref 70–99)

## 2022-03-07 LAB — POCT GLYCOSYLATED HEMOGLOBIN (HGB A1C): Hemoglobin A1C: 11.4 % — AB (ref 4.0–5.6)

## 2022-03-07 MED ORDER — METFORMIN HCL 1000 MG PO TABS: 1000.0000 mg | ORAL_TABLET | Freq: Two times a day (BID) | ORAL | 1 refills | Status: DC

## 2022-03-07 MED ORDER — GLIPIZIDE 5 MG PO TABS: 5.0000 mg | ORAL_TABLET | Freq: Two times a day (BID) | ORAL | 2 refills | Status: DC

## 2022-03-07 MED ORDER — DAPAGLIFLOZIN PROPANEDIOL 5 MG PO TABS: 5.0000 mg | ORAL_TABLET | Freq: Every day | ORAL | 6 refills | Status: DC

## 2022-03-07 NOTE — Progress Notes (Signed)
?Name: [name]  ?MRN/ DOB: [id number], Feb 01, 1961   ?Age/ Sex: 61 y.o., female   ? ?PCP: Benito Mccreedy, MD   ?Reason for Endocrinology Evaluation: Type 2 Diabetes Mellitus  ?   ?Date of Initial Endocrinology Visit: 03/07/2022   ? ? ?PATIENT IDENTIFIER: [name] is a 61 y.o. female with a past medical history of T2DM, CKD, CHF, HTN , CAD and dyslipidemia . The patient presented for initial endocrinology clinic visit on 03/07/2022 for consultative assistance with her diabetes management.  ? ? ?HPI: ?Ms. Daws was  ? ? ?Diagnosed with DM ~ 9 yrs  ?Prior Medications tried/Intolerance: as listed  ?Currently checking blood sugars 1 x / day  ?Hypoglycemia episodes :  Yes, lowest 60 mg/dL last month      ?Hemoglobin A1c has ranged from 11.8% in 12/2020, peaking at 12.2% in 06/2021. ?Patient required assistance for hypoglycemia: no ?Patient has required hospitalization within the last 1 year from hyper or hypoglycemia: no  ? ?In terms of diet, the patient eats 3 meals a day, snacks a day. Drinks sugar sweetened beverages  ? ? ?Follows with [facility]  ? ? ? ? ?HOME DIABETES REGIMEN: ?Metformin 1000 mg BID  ?Glipizide 5 mg daily BID  ? ? ?Statin: yes ?ACE-I/ARB: yes ? ? ? ?METER DOWNLOAD SUMMARY: Did not bring  ? ?DIABETIC COMPLICATIONS: ?Microvascular complications:  ?CKD III , neuropathy  ?Denies: retinopathy  ?Last eye exam: Completed 02/2022 ? ?Macrovascular complications:  ?CHF, CVA  ?Denies: CAD, PVD ? ? ?PAST HISTORY: ?Past Medical History:  ?Past Medical History:  ?Diagnosis Date  ? Asthma   ? prn - occasional uses inhaler  ? Chronic systolic CHF (congestive heart failure) ([place])   ? Diabetes mellitus without complication ([place])   ? borderline, being monitored, no meds  ? Hypercholesterolemia   ? Hypertension   ? Microcytic anemia   ? Nonischemic cardiomyopathy ([place])   ? a. 2005 Cath: nonobstructive dzs;  b. 04/2013 Echo: EF 3035%, diff HK, restrictive physiology, mildly dil LA, PASP 9mHg.  ?  SVD (spontaneous vaginal delivery)   ? x 3  ? ?Past Surgical History:  ?Past Surgical History:  ?Procedure Laterality Date  ? BILATERAL SALPINGECTOMY Bilateral 01/15/2015  ? Procedure: BILATERAL SALPINGECTOMY;  Surgeon: SMarvene Staff MD;  [facility];  Service: Gynecology;  Laterality: Bilateral;  ? CORONARY ANGIOPLASTY  2005  ? LEFT AND RIGHT HEART CATHETERIZATION WITH CORONARY ANGIOGRAM N/A 07/05/2013  ? Procedure: LEFT AND RIGHT HEART CATHETERIZATION WITH CORONARY ANGIOGRAM;  Surgeon: DLarey Dresser MD;  [facility];  Service: Cardiovascular;  Laterality: N/A;  ? RIGHT HEART CATHETERIZATION N/A 06/13/2013  ? Procedure: RIGHT HEART CATH;  Surgeon: PJosue Hector MD;  [facility];  Service: Cardiovascular;  Laterality: N/A;  ? ROBOTIC ASSISTED TOTAL HYSTERECTOMY N/A 01/15/2015  ? Procedure: ROBOTIC ASSISTED TOTAL HYSTERECTOMY;  Surgeon: SMarvene Staff MD;  [facility];  Service: Gynecology;  Laterality: N/A;  ? TUBAL LIGATION    ? WISDOM TOOTH EXTRACTION    ?  ?Social History:  reports that she has never smoked. She has never used smokeless tobacco. She reports that she does not drink alcohol and does not use drugs. ?Family History:  ?Family History  ?Problem Relation Age of Onset  ? Hypertension Sister   ? Hyperlipidemia Sister   ? Heart disease Brother   ?     History of heart transplant  ? Hypertension Brother   ? Hypertension Sister   ?  Hyperlipidemia Sister   ? Heart disease Brother   ?     Coronary artery disease  ? Hypertension Brother   ? Heart disease Brother   ? Hypertension Brother   ? Heart disease Brother   ? Hypertension Brother   ? ? ? ?HOME MEDICATIONS: ?Allergies as of 03/07/2022   ?No Known Allergies ?  ? ?  ?Medication List  ?  ? ?  ? Accurate as of March 07, 2022  8:33 AM. If you have any questions, ask your nurse or doctor.  ?  ?  ? ?  ? ?Accu-Chek Aviva Plus test strip ?Generic drug: glucose blood ?1 each 3 (three) times daily. ?  ?Accu-Chek Softclix  Lancets lancets ?1 each 3 (three) times daily. ?  ?acetaminophen 325 MG tablet ?Commonly known as: TYLENOL ?Take 325-650 mg by mouth every 6 (six) hours as needed for mild pain (depends on pain). ?  ?albuterol 108 (90 Base) MCG/ACT inhaler ?Commonly known as: VENTOLIN HFA ?Inhale 2 puffs into the lungs every 4 (four) hours as needed for wheezing or shortness of breath. ?  ?carvedilol 12.5 MG tablet ?Commonly known as: COREG ?TAKE 1 TABLET BY MOUTH 2 TIMES DAILY WITH A MEAL. ?What changed:  ?how much to take ?how to take this ?when to take this ?additional instructions ?  ?cilostazol 50 MG tablet ?Commonly known as: PLETAL ?Take 1 tablet (50 mg total) by mouth 2 (two) times daily. ?  ?clopidogrel 75 MG tablet ?Commonly known as: PLAVIX ?Take 1 tablet (75 mg total) by mouth daily. ?  ?ferrous sulfate 325 (65 FE) MG tablet ?Take 325 mg by mouth daily with breakfast. ?  ?furosemide 40 MG tablet ?Commonly known as: LASIX ?Take 1 tablet (40 mg total) by mouth daily. ?What changed:  ?how much to take ?when to take this ?  ?glipiZIDE 5 MG tablet ?Commonly known as: GLUCOTROL ?Take 5 mg by mouth 2 (two) times daily before a meal. ?  ?lisinopril 10 MG tablet ?Commonly known as: ZESTRIL ?Take 10 mg by mouth daily. ?  ?metFORMIN 1000 MG tablet ?Commonly known as: GLUCOPHAGE ?Take 1,000 mg by mouth 2 (two) times daily. ?  ?omega-3 acid ethyl esters 1 g capsule ?Commonly known as: LOVAZA ?Take 2 capsules by mouth 2 (two) times daily. ?  ?Repatha SureClick 599 MG/ML Soaj ?Generic drug: Evolocumab ?Inject 1 mL into the skin every 14 (fourteen) days. ?  ?rosuvastatin 40 MG tablet ?Commonly known as: CRESTOR ?Take 1 tablet (40 mg total) by mouth at bedtime. ?  ?spironolactone 25 MG tablet ?Commonly known as: ALDACTONE ?Take 25 mg by mouth daily. ?  ?VITAMIN D PO ?Take 125 mcg by mouth in the morning and at bedtime. ?  ? ?  ? ? ? ?ALLERGIES: ?No Known Allergies ? ? ?REVIEW OF SYSTEMS: ?A comprehensive ROS was conducted with the  patient and is negative except as per HPI and below:  ?Review of Systems  ?Gastrointestinal:  Negative for diarrhea, nausea and vomiting.  ? ?  ?OBJECTIVE:  ? ?VITAL SIGNS: BP 126/72 (BP Location: Left Arm, Patient Position: Sitting, Cuff Size: Large)   Pulse 74   Ht '5\' 1"'$  (1.549 m)   Wt 179 lb (81.2 kg)   LMP 01/01/2015 (Approximate)   SpO2 96%   BMI 33.82 kg/m?   ? ?PHYSICAL EXAM:  ?General: Pt appears well and is in NAD  ?Neck: General: Supple without adenopathy or carotid bruits. ?Thyroid: Thyroid size normal.  No goiter or nodules appreciated.   ?  Lungs: Clear with good BS bilat with no rales, rhonchi, or wheezes  ?Heart: RRR with normal S1 and S2 and no gallops; no murmurs; no rub  ?Abdomen: Normoactive bowel sounds, soft, nontender, without masses or organomegaly palpable  ?Extremities:  ?Lower extremities - No pretibial edema. No lesions.  ?Neuro: MS is good with appropriate affect, pt is alert and Ox3  ? ? ?DM foot exam: 03/07/2022 ? ?The skin of the feet is intact without sores or ulcerations. ?The pedal pulses are 2+ on right and 2+ on left. ?The sensation is intact to a screening 5.07, 10 gram monofilament bilaterally ? ? ? ?DATA REVIEWED: ? ?Lab Results  ?Component Value Date  ? HGBA1C 11.4 (A) 03/07/2022  ? HGBA1C 12.2 (H) 07/01/2021  ? HGBA1C 11.8 (H) 01/01/2021  ? ?Lab Results  ?Component Value Date  ? CHOL 88 (L) 10/27/2021  ? HDL 46 10/27/2021  ? Lavina 20 10/27/2021  ? TRIG 121 10/27/2021  ? CHOLHDL 1.9 10/27/2021  ?     ? Latest Reference Range & Units 03/07/22 08:59  ?Sodium 135 - 145 mEq/L 138  ?Potassium 3.5 - 5.1 mEq/L 4.4  ?Chloride 96 - 112 mEq/L 99  ?CO2 19 - 32 mEq/L 29  ?Glucose 70 - 99 mg/dL 332 (H)  ?BUN 6 - 23 mg/dL 12  ?Creatinine 0.40 - 1.20 mg/dL 1.25 (H)  ?Calcium 8.4 - 10.5 mg/dL 10.0  ?GFR >60.00 mL/min 46.19 (L)  ? ? ?06/2021 ?BUN/Cr 12/1.31 ?GFR 46  ?Gluc 364 mg/dL  ?Ca 10.1  ? ?ASSESSMENT / PLAN / RECOMMENDATIONS:  ? ?1) Type 2 Diabetes Mellitus, Poorly controlled, With  CKD III complications - Most recent A1c of 11.4 %. Goal A1c < 7.0 %.   ? ?Plan: ?GENERAL: ?I have discussed with the patient the pathophysiology of diabetes. We stressed the importance of lifestyle changes includin

## 2022-03-07 NOTE — Patient Instructions (Signed)
Continue Metformin 1000 mg Twice a day ?Continue Glipizide 5 mg, 1 tablet before Breakfast and 1 tablet before Supper  ?Start Farxiga 5 mg daily  ? ? ? ? ? ?HOW TO TREAT LOW BLOOD SUGARS (Blood sugar LESS THAN 70 MG/DL) ?Please follow the RULE OF 15 for the treatment of hypoglycemia treatment (when your (blood sugars are less than 70 mg/dL)  ? ?STEP 1: Take 15 grams of carbohydrates when your blood sugar is low, which includes:  ?3-4 GLUCOSE TABS  OR ?3-4 OZ OF JUICE OR REGULAR SODA OR ?ONE TUBE OF GLUCOSE GEL   ? ?STEP 2: RECHECK blood sugar in 15 MINUTES ?STEP 3: If your blood sugar is still low at the 15 minute recheck --> then, go back to STEP 1 and treat AGAIN with another 15 grams of carbohydrates. ? ?

## 2022-03-11 ENCOUNTER — Other Ambulatory Visit: Payer: Self-pay

## 2022-03-11 ENCOUNTER — Ambulatory Visit: Payer: Medicare HMO | Admitting: Cardiology

## 2022-03-11 DIAGNOSIS — E782 Mixed hyperlipidemia: Secondary | ICD-10-CM | POA: Diagnosis not present

## 2022-03-11 MED ORDER — EVOLOCUMAB 140 MG/ML ~~LOC~~ SOAJ
140.0000 mg | Freq: Once | SUBCUTANEOUS | Status: AC
  Administered 2022-03-11: 140 mg via SUBCUTANEOUS

## 2022-03-11 NOTE — Progress Notes (Signed)
Mixed hyperlipidemia - Plan: Evolocumab SOAJ 140 mg ?Administration Action Time Recorded Time Documented By Site Comment Reason Patient Supplied  ?Given : 140 mg :   : Subcutaneous 03/11/22 0901 03/11/22 0905 Gaye Alken, CMA Left Lower Abdomen   Yes  ? ?

## 2022-03-24 DIAGNOSIS — J4520 Mild intermittent asthma, uncomplicated: Secondary | ICD-10-CM | POA: Diagnosis not present

## 2022-03-24 DIAGNOSIS — I10 Essential (primary) hypertension: Secondary | ICD-10-CM | POA: Diagnosis not present

## 2022-03-24 DIAGNOSIS — Z0000 Encounter for general adult medical examination without abnormal findings: Secondary | ICD-10-CM | POA: Diagnosis not present

## 2022-03-24 DIAGNOSIS — E6609 Other obesity due to excess calories: Secondary | ICD-10-CM | POA: Diagnosis not present

## 2022-03-24 DIAGNOSIS — E7800 Pure hypercholesterolemia, unspecified: Secondary | ICD-10-CM | POA: Diagnosis not present

## 2022-03-24 DIAGNOSIS — I5032 Chronic diastolic (congestive) heart failure: Secondary | ICD-10-CM | POA: Diagnosis not present

## 2022-03-24 DIAGNOSIS — N1831 Chronic kidney disease, stage 3a: Secondary | ICD-10-CM | POA: Diagnosis not present

## 2022-03-24 DIAGNOSIS — E1165 Type 2 diabetes mellitus with hyperglycemia: Secondary | ICD-10-CM | POA: Diagnosis not present

## 2022-03-25 DIAGNOSIS — I10 Essential (primary) hypertension: Secondary | ICD-10-CM | POA: Diagnosis not present

## 2022-03-28 ENCOUNTER — Encounter: Payer: Medicare HMO | Admitting: Cardiology

## 2022-03-28 DIAGNOSIS — E782 Mixed hyperlipidemia: Secondary | ICD-10-CM

## 2022-03-28 MED ORDER — EVOLOCUMAB 140 MG/ML ~~LOC~~ SOAJ
140.0000 mg | Freq: Once | SUBCUTANEOUS | Status: AC
  Administered 2022-03-28: 140 mg via SUBCUTANEOUS

## 2022-04-11 ENCOUNTER — Ambulatory Visit: Payer: Medicare HMO | Admitting: Cardiology

## 2022-04-11 DIAGNOSIS — I2510 Atherosclerotic heart disease of native coronary artery without angina pectoris: Secondary | ICD-10-CM | POA: Diagnosis not present

## 2022-04-11 DIAGNOSIS — N1831 Chronic kidney disease, stage 3a: Secondary | ICD-10-CM | POA: Diagnosis not present

## 2022-04-11 DIAGNOSIS — E782 Mixed hyperlipidemia: Secondary | ICD-10-CM

## 2022-04-11 DIAGNOSIS — N189 Chronic kidney disease, unspecified: Secondary | ICD-10-CM | POA: Diagnosis not present

## 2022-04-11 MED ORDER — EVOLOCUMAB 140 MG/ML ~~LOC~~ SOAJ
140.0000 mg | Freq: Once | SUBCUTANEOUS | Status: AC
  Administered 2022-04-11: 140 mg via SUBCUTANEOUS

## 2022-04-11 NOTE — Progress Notes (Signed)
ICD-10-CM   ?1. Coronary artery disease involving native coronary artery of native heart without angina pectoris  I25.10   ?  ?2. Mixed hyperlipidemia  E78.2 Evolocumab SOAJ 140 mg  ?  ? ?Administration Action Time Recorded Time Documented By Site Comment Reason Patient Supplied  ?Given : 140 mg :   : Subcutaneous 04/11/22 1246 04/11/22 [place], Amber L Right Lower Abdomen [place] [id number]  ?LOT [id number]  No  ? ?

## 2022-04-21 DIAGNOSIS — I129 Hypertensive chronic kidney disease with stage 1 through stage 4 chronic kidney disease, or unspecified chronic kidney disease: Secondary | ICD-10-CM | POA: Diagnosis not present

## 2022-04-21 DIAGNOSIS — N1831 Chronic kidney disease, stage 3a: Secondary | ICD-10-CM | POA: Diagnosis not present

## 2022-04-21 DIAGNOSIS — E669 Obesity, unspecified: Secondary | ICD-10-CM | POA: Diagnosis not present

## 2022-04-21 DIAGNOSIS — D631 Anemia in chronic kidney disease: Secondary | ICD-10-CM | POA: Diagnosis not present

## 2022-04-21 DIAGNOSIS — I509 Heart failure, unspecified: Secondary | ICD-10-CM | POA: Diagnosis not present

## 2022-04-21 DIAGNOSIS — T452X1A Poisoning by vitamins, accidental (unintentional), initial encounter: Secondary | ICD-10-CM | POA: Diagnosis not present

## 2022-04-21 DIAGNOSIS — E1122 Type 2 diabetes mellitus with diabetic chronic kidney disease: Secondary | ICD-10-CM | POA: Diagnosis not present

## 2022-04-21 DIAGNOSIS — N2581 Secondary hyperparathyroidism of renal origin: Secondary | ICD-10-CM | POA: Diagnosis not present

## 2022-04-24 DIAGNOSIS — I10 Essential (primary) hypertension: Secondary | ICD-10-CM | POA: Diagnosis not present

## 2022-04-25 ENCOUNTER — Ambulatory Visit: Payer: Medicare HMO | Admitting: Cardiology

## 2022-04-25 DIAGNOSIS — E782 Mixed hyperlipidemia: Secondary | ICD-10-CM

## 2022-04-25 MED ORDER — EVOLOCUMAB 140 MG/ML ~~LOC~~ SOAJ
140.0000 mg | Freq: Once | SUBCUTANEOUS | Status: AC
  Administered 2022-04-25: 140 mg via SUBCUTANEOUS

## 2022-04-25 NOTE — Progress Notes (Addendum)
ICD-10-CM   ?1. Mixed hyperlipidemia  E78.2 Evolocumab SOAJ 140 mg  ?  ? ? ?The administrations shown are only for this specific order and not for other orders for the same medication that may be in this encounter.  ? ?Administration Action Time Recorded Time Documented By Site Comment Reason Patient Supplied  ?Given : 140 mg :   : Subcutaneous 04/25/22 0851 04/25/22 0852 Mares, Lesley Right Lower Abdomen LOT: [id number] ?[place]: [id number]  No  ? ?

## 2022-05-04 ENCOUNTER — Ambulatory Visit: Payer: Medicare HMO | Admitting: Cardiology

## 2022-05-08 ENCOUNTER — Other Ambulatory Visit: Payer: Self-pay | Admitting: Cardiology

## 2022-05-09 ENCOUNTER — Ambulatory Visit: Payer: Medicare HMO | Admitting: Cardiology

## 2022-05-09 DIAGNOSIS — E782 Mixed hyperlipidemia: Secondary | ICD-10-CM | POA: Diagnosis not present

## 2022-05-09 MED ORDER — EVOLOCUMAB 140 MG/ML ~~LOC~~ SOAJ
140.0000 mg | Freq: Once | SUBCUTANEOUS | Status: AC
  Administered 2022-05-09: 140 mg via SUBCUTANEOUS

## 2022-05-09 NOTE — Progress Notes (Signed)
ICD-10-CM   1. Mixed hyperlipidemia  E78.2 Evolocumab SOAJ 140 mg     Administration Action Time Recorded Time Documented By Site Comment Reason Patient Supplied  Given : 140 mg :   : Subcutaneous 05/09/22 0858 05/09/22 [place], Amber L Left Lower Abdomen [place] [id number]  LOT [id number]  No

## 2022-05-10 ENCOUNTER — Ambulatory Visit: Payer: Medicare HMO | Admitting: Cardiology

## 2022-05-10 ENCOUNTER — Encounter: Payer: Self-pay | Admitting: Cardiology

## 2022-05-10 VITALS — BP 153/72 | HR 56 | Temp 98.0°F | Resp 16 | Ht 61.0 in | Wt 175.0 lb

## 2022-05-10 DIAGNOSIS — E782 Mixed hyperlipidemia: Secondary | ICD-10-CM | POA: Diagnosis not present

## 2022-05-10 DIAGNOSIS — I739 Peripheral vascular disease, unspecified: Secondary | ICD-10-CM

## 2022-05-10 NOTE — Progress Notes (Signed)
Patient referred by Benito Mccreedy, MD for abnormal ABI  Subjective:   [facility], female    DOB: Jan 01, 1961, 61 y.o.   MRN: [id number]   Chief Complaint  Patient presents with   PAD   Follow-up    6 month    HPI  60 y/o African-American female with hypertension, type 2 diabetes mellitus, hyperlipidemia, coronary artery disease, PAD, nonischemic cardiomyopathy with now recovered EF, pulmonary hypertension  Patient denies any any cardiac complaints, claudication is controlled. Blood pressure mildly elevated today, but much lower at home. Diabetes remains very uncontrolled. .  Current Outpatient Medications:    ACCU-CHEK AVIVA PLUS test strip, 1 each 3 (three) times daily., Disp: , Rfl:    Accu-Chek Softclix Lancets lancets, 1 each 3 (three) times daily., Disp: , Rfl:    acetaminophen (TYLENOL) 325 MG tablet, Take 325-650 mg by mouth every 6 (six) hours as needed for mild pain (depends on pain)., Disp: , Rfl:    albuterol (PROVENTIL HFA;VENTOLIN HFA) 108 (90 BASE) MCG/ACT inhaler, Inhale 2 puffs into the lungs every 4 (four) hours as needed for wheezing or shortness of breath., Disp: , Rfl:    carvedilol (COREG) 12.5 MG tablet, TAKE 1 TABLET BY MOUTH 2 TIMES DAILY WITH A MEAL. (Patient taking differently: Take 12.5 mg by mouth 2 (two) times daily with a meal.), Disp: 60 tablet, Rfl: 11   Cholecalciferol (VITAMIN D PO), Take 125 mcg by mouth in the morning and at bedtime. , Disp: , Rfl:    cilostazol (PLETAL) 50 MG tablet, Take 1 tablet (50 mg total) by mouth 2 (two) times daily., Disp: 60 tablet, Rfl: 2   clopidogrel (PLAVIX) 75 MG tablet, Take 1 tablet (75 mg total) by mouth daily., Disp: 90 tablet, Rfl: 3   dapagliflozin propanediol (FARXIGA) 5 MG TABS tablet, Take 1 tablet (5 mg total) by mouth daily before breakfast., Disp: 30 tablet, Rfl: 6   ferrous sulfate 325 (65 FE) MG tablet, Take 325 mg by mouth daily with breakfast., Disp: , Rfl:    furosemide (LASIX) 40 MG  tablet, Take 1 tablet (40 mg total) by mouth daily. (Patient taking differently: Take 20 mg by mouth 2 (two) times daily.), Disp: 30 tablet, Rfl: 11   glipiZIDE (GLUCOTROL) 5 MG tablet, Take 1 tablet (5 mg total) by mouth 2 (two) times daily before a meal., Disp: 180 tablet, Rfl: 2   lisinopril (ZESTRIL) 10 MG tablet, Take 10 mg by mouth daily. , Disp: , Rfl:    metFORMIN (GLUCOPHAGE) 1000 MG tablet, Take 1 tablet (1,000 mg total) by mouth 2 (two) times daily., Disp: 180 tablet, Rfl: 1   omega-3 acid ethyl esters (LOVAZA) 1 g capsule, Take 2 capsules by mouth 2 (two) times daily., Disp: , Rfl:    REPATHA SURECLICK 382 MG/ML SOAJ, INJECT 1ML INTO THE SKIN EVERY 14 DAYS, Disp: 2 mL, Rfl: 0   rosuvastatin (CRESTOR) 40 MG tablet, Take 1 tablet (40 mg total) by mouth at bedtime., Disp: 30 tablet, Rfl: 11   spironolactone (ALDACTONE) 25 MG tablet, Take 25 mg by mouth daily., Disp: , Rfl:   Cardiovascular and other pertinent studies:  EKG 05/10/2022: Sinus rhythm 60 bpm Poor R-wave progression Consider old anterior infarct Nonspecific T-abnormality Low voltage  Echocardiogram 03/09/2020:  Normal LV systolic function with visual EF 55-60%. Left ventricle cavity  is normal in size. Moderate left ventricular hypertrophy. Normal global  wall motion. No obvious regional wall motion abnormalities. Doppler  evidence of grade  I diastolic dysfunction, elevated LAP. Calculated EF  55%.  No significant valvular abnormalities.  IVC is normal with a respiratory response of <50%.  No significant change compared to prior study dated 11/2015 except Grade I  diastolic dysfunction is new.   ABI 02/06/2020: Rt 0.68 Lt 0.88  LHC/RHC 06/2013: LM: No significant disease LAD: 50% mid LAD stenosis with distal tapering.  LCx: Moderate early OM1 with 50% ostial stenosis. Mid LCx with 40% stenosis.  RCA: Diffuse RCA disease with 70-80% ostial stenosis and damping of the waveform; serial 50% stenoses in the mid and  distal RCA.  Reviewed with Dr. Angelena Form.  70-80% stenosis with ostial damping in setting of relatively small caliber RCA.  The RCA is diffusely disease.  In the absence of ACS (no chest pain), would treat medically  Left ventriculography: Global hypokinesis with EF estimated at 35%.  Optimized PCWP on current dose of Lasix (16 mmHg).   Pulmonary arterial HTN with elevated PVR, not responsive to adenosine.  Given co-existing LV dysfunction, I am going to try her on sildenafil 20 mg tid.   She will followup in clinic in about 2 wks.   [place] 05/2013: Mean RA:  20  mmHg RV: 76/12 mmHg PA: 75/36 mmHg with a mean of 49 mmHg PCWP: 27 mmHg   Ao sat : 94% PA sat 50%    Fick Cardiac Output:  3.1 L/minute,  1.8 L/min minute INR INR ute/M2   PVR: 8.0 wood unites 567 dysnes-sec cm5   Impression:  PAD significantly higher than PCWP indicating some intrinsic pulmonary hypertension.  Increase diuretics and add nitrates Refer to CHF clinic and continue f/u with pulmonary ? [place]  Recent labs: 03/07/2022: Glucose 332, BUN/Cr 12/1.25. EGFR 46. Na/K 138/4.4.  HbA1C 11.4%  10/27/2021: Chol 88, TG 121, HDL 46, LDL 20  07/01/2021: Chol 189, TG 160, HDL 39, LDL 121 HbA1C 12.2%  01/01/2021: HbA1C 11.8%  11/20/2020: Glucose 413, BUN/Cr 10/1.15. EGFR 54. Na/K 138/3.8. T.bili 1.6. Rest of the CMP normal H/H 13/40. MCV 86. Platelets 380  01/09/2020: Glucose 150, BUN/Cr 8/0.89. EGFR 81. Na/K 144/4.  Rest of the CMP normal H/H 12.0/36.5. MCV 85. Platelets 405 HbA1C 6.9% Chol 160, TG 108, HDL 43, LDL 97    Review of Systems  Cardiovascular: Positive for claudication. Negative for chest pain, dyspnea on exertion, leg swelling, palpitations and syncope.       Vitals:   05/10/22 1259  BP: (!) 153/72  Pulse: (!) 56  Resp: 16  Temp: 98 F (36.7 C)  SpO2: 96%     Body mass index is 33.07 kg/m. Filed Weights   05/10/22 1259  Weight: 175 lb (79.4 kg)     Objective:    Physical  Exam Vitals and nursing note reviewed.  Constitutional:      General: She is not in acute distress. Neck:     Vascular: No JVD.  Cardiovascular:     Rate and Rhythm: Normal rate and regular rhythm.     Pulses: Intact distal pulses.          Femoral pulses are 1+ on the right side and 1+ on the left side.      Dorsalis pedis pulses are 0 on the right side and 1+ on the left side.       Posterior tibial pulses are 0 on the right side and 0 on the left side.     Heart sounds: Normal heart sounds. No murmur heard. Pulmonary:  Effort: Pulmonary effort is normal.     Breath sounds: Normal breath sounds. No wheezing or rales.  Musculoskeletal:     Right lower leg: No edema.     Left lower leg: No edema.           Assessment & Recommendations:   61 y/o African-American female with hypertension, type 2 diabetes mellitus, hyperlipidemia, coronary artery disease, PAD, nonischemic cardiomyopathy with now recovered EF, pulmonary hypertension  Hypertension: Generally well controlled. No change made today.  PAD: No critical limb ischemia.  Abnormal ABI Rt 0.68, Lt 0.78 (01/2020). Rutherford class III claudication, controlled with medical therapy. Continue plavix 75 mg daily, Pletal 50 mg bid. Needs aggressive risk factor modification, especially diabetes and hyperlipidemia, see below.    Mixed hyperlipidemia: LDL down to 21 on Repatha and Crestor.   Uncontrolled type 2 DM: Still remains extremely uncontrolled with A1C of 11.4%, and inadequately treated-only on metformin. Continue f/u w/endocrinology.  Coronary artery disease: No angina symptoms at this time.  Continue medical therapy.  F/u in 6 months  Aleah Ahlgrim Esther Hardy, MD [facility]. [place] Pager: [phone] Office: [phone]

## 2022-05-23 ENCOUNTER — Ambulatory Visit: Payer: Medicare HMO | Admitting: Cardiology

## 2022-05-23 DIAGNOSIS — E782 Mixed hyperlipidemia: Secondary | ICD-10-CM | POA: Diagnosis not present

## 2022-05-23 MED ORDER — EVOLOCUMAB 140 MG/ML ~~LOC~~ SOAJ
140.0000 mg | Freq: Once | SUBCUTANEOUS | Status: AC
  Administered 2022-05-23: 140 mg via SUBCUTANEOUS

## 2022-05-23 NOTE — Progress Notes (Signed)
ICD-10-CM   1. Mixed hyperlipidemia  E78.2 Evolocumab SOAJ 140 mg     Administration Action Time Recorded Time Documented By Site Comment Reason Patient Supplied  Given : 140 mg :   : Subcutaneous 05/23/22 0952 05/23/22 0954 Mares, Lesley Left Lower Abdomen NDC: [id number]  LOT: [id number]  EXP: 09/18/2023  No

## 2022-05-25 DIAGNOSIS — I10 Essential (primary) hypertension: Secondary | ICD-10-CM | POA: Diagnosis not present

## 2022-05-31 ENCOUNTER — Other Ambulatory Visit: Payer: Self-pay | Admitting: Cardiology

## 2022-06-06 ENCOUNTER — Ambulatory Visit: Payer: Medicare HMO | Admitting: Cardiology

## 2022-06-06 DIAGNOSIS — E782 Mixed hyperlipidemia: Secondary | ICD-10-CM | POA: Diagnosis not present

## 2022-06-06 DIAGNOSIS — I2510 Atherosclerotic heart disease of native coronary artery without angina pectoris: Secondary | ICD-10-CM | POA: Diagnosis not present

## 2022-06-06 MED ORDER — EVOLOCUMAB 140 MG/ML ~~LOC~~ SOAJ
140.0000 mg | Freq: Once | SUBCUTANEOUS | Status: AC
  Administered 2022-06-06: 140 mg via SUBCUTANEOUS

## 2022-06-06 NOTE — Progress Notes (Signed)
Administration Action Time Recorded Time Documented By Site Comment Reason Patient Supplied  Given : 140 mg :   : Subcutaneous 06/06/22 0856 06/06/22 0857 Nicola Girt, Izadora Roehr L Right Lower Abdomen NDC: [id number]  LOT: [id number]  No     ICD-10-CM   1. Mixed hyperlipidemia  E78.2 Evolocumab SOAJ 140 mg    2. Coronary artery disease involving native coronary artery of native heart without angina pectoris  I25.10

## 2022-06-10 DIAGNOSIS — E7800 Pure hypercholesterolemia, unspecified: Secondary | ICD-10-CM | POA: Diagnosis not present

## 2022-06-10 DIAGNOSIS — E6609 Other obesity due to excess calories: Secondary | ICD-10-CM | POA: Diagnosis not present

## 2022-06-10 DIAGNOSIS — N1831 Chronic kidney disease, stage 3a: Secondary | ICD-10-CM | POA: Diagnosis not present

## 2022-06-10 DIAGNOSIS — E1165 Type 2 diabetes mellitus with hyperglycemia: Secondary | ICD-10-CM | POA: Diagnosis not present

## 2022-06-10 DIAGNOSIS — Z0000 Encounter for general adult medical examination without abnormal findings: Secondary | ICD-10-CM | POA: Diagnosis not present

## 2022-06-10 DIAGNOSIS — J4520 Mild intermittent asthma, uncomplicated: Secondary | ICD-10-CM | POA: Diagnosis not present

## 2022-06-10 DIAGNOSIS — I5032 Chronic diastolic (congestive) heart failure: Secondary | ICD-10-CM | POA: Diagnosis not present

## 2022-06-10 DIAGNOSIS — I10 Essential (primary) hypertension: Secondary | ICD-10-CM | POA: Diagnosis not present

## 2022-06-20 ENCOUNTER — Ambulatory Visit: Payer: Medicare HMO | Admitting: Cardiology

## 2022-06-20 DIAGNOSIS — E782 Mixed hyperlipidemia: Secondary | ICD-10-CM

## 2022-06-20 DIAGNOSIS — I2510 Atherosclerotic heart disease of native coronary artery without angina pectoris: Secondary | ICD-10-CM | POA: Diagnosis not present

## 2022-06-20 MED ORDER — EVOLOCUMAB 140 MG/ML ~~LOC~~ SOAJ: 140.0000 mg | Freq: Once | SUBCUTANEOUS | Status: DC

## 2022-06-20 MED ORDER — EVOLOCUMAB 140 MG/ML ~~LOC~~ SOAJ
140.0000 mg | Freq: Once | SUBCUTANEOUS | Status: AC
  Administered 2022-06-20: 140 mg via SUBCUTANEOUS

## 2022-06-20 NOTE — Progress Notes (Unsigned)
Pt came to the office to get her Repatha shot. On the Left lower abdominal.   Administration Action Time Recorded Time Documented By Site Comment Reason Patient Supplied  Given : 140 mg :   : Subcutaneous 06/20/22 0933 06/20/22 0934 Anavi Branscum Left Lower Abdomen NDC: [id number]  LOT: [id number]  EXP: 09/17/2022  No

## 2022-06-22 ENCOUNTER — Other Ambulatory Visit: Payer: Self-pay | Admitting: Cardiology

## 2022-07-04 ENCOUNTER — Ambulatory Visit: Payer: Medicare HMO | Admitting: Cardiology

## 2022-07-04 DIAGNOSIS — I2510 Atherosclerotic heart disease of native coronary artery without angina pectoris: Secondary | ICD-10-CM

## 2022-07-04 DIAGNOSIS — E782 Mixed hyperlipidemia: Secondary | ICD-10-CM

## 2022-07-04 MED ORDER — EVOLOCUMAB 140 MG/ML ~~LOC~~ SOAJ
140.0000 mg | Freq: Once | SUBCUTANEOUS | Status: AC
  Administered 2022-07-04: 140 mg via SUBCUTANEOUS

## 2022-07-04 NOTE — Progress Notes (Signed)
ICD-10-CM   1. Coronary artery disease involving native coronary artery of native heart without angina pectoris  I25.10     2. Mixed hyperlipidemia  E78.2

## 2022-07-08 NOTE — Progress Notes (Unsigned)
Name: [facility]  MRN/ DOB: [id number], 28-Sep-1961   Age/ Sex: 61 y.o., female    PCP: Benito Mccreedy, MD   Reason for Endocrinology Evaluation: Type 2 Diabetes Mellitus     Date of Initial Endocrinology Visit: 03/07/2022    PATIENT IDENTIFIER: [name] is a 61 y.o. female with a past medical history of T2DM, CKD, CHF, HTN , CAD and dyslipidemia . The patient presented for initial endocrinology clinic visit on 03/07/2022 for consultative assistance with her diabetes management.    HPI: Ms. Venning was    Diagnosed with DM ~ 9 yrs  Hemoglobin A1c has ranged from 11.8% in 12/2020, peaking at 12.2% in 06/2021.   Follows with [facility]   On her initial visit her A1c was 11.4%, she was already on metformin, and glipizide, she declined insulin.  We started her on Farxiga SUBJECTIVE:   During the last visit (03/07/2022): A1c 11.4% continued metformin, glipizide and started [place]  Today ('@TD'$ @): Ms. Farler is here for follow-up on diabetes management.she checks her blood sugars *** times daily, preprandial to breakfast and ***. The patient has *** had hypoglycemic episodes since the last clinic visit, which typically occur *** x / - most often occuring ***. The patient is *** symptomatic with these episodes, with symptoms of {symptoms; hypoglycemia:[id number]}.     HOME DIABETES REGIMEN: Metformin 1000 mg BID  Glipizide 5 mg daily BID  Farxiga 5 mg daily  Statin: yes ACE-I/ARB: yes    METER DOWNLOAD SUMMARY: Did not bring   DIABETIC COMPLICATIONS: Microvascular complications:  CKD III , neuropathy  Denies: retinopathy  Last eye exam: Completed 02/2022  Macrovascular complications:  CHF, CVA  Denies: CAD, PVD   PAST HISTORY: Past Medical History:  Past Medical History:  Diagnosis Date   Asthma    prn - occasional uses inhaler   Chronic systolic CHF (congestive heart failure) ([place])    Diabetes mellitus without complication ([place])    borderline,  being monitored, no meds   Hypercholesterolemia    Hypertension    Microcytic anemia    Nonischemic cardiomyopathy ([place])    a. 2005 Cath: nonobstructive dzs;  b. 04/2013 Echo: EF 3035%, diff HK, restrictive physiology, mildly dil LA, PASP 54mHg.   SVD (spontaneous vaginal delivery)    x 3   Past Surgical History:  Past Surgical History:  Procedure Laterality Date   BILATERAL SALPINGECTOMY Bilateral 01/15/2015   Procedure: BILATERAL SALPINGECTOMY;  Surgeon: SMarvene Staff MD;  [facility];  Service: Gynecology;  Laterality: Bilateral;   CORONARY ANGIOPLASTY  2005   LEFT AND RIGHT HEART CATHETERIZATION WITH CORONARY ANGIOGRAM N/A 07/05/2013   Procedure: LEFT AND RIGHT HEART CATHETERIZATION WITH CORONARY ANGIOGRAM;  Surgeon: DLarey Dresser MD;  [facility];  Service: Cardiovascular;  Laterality: N/A;   RIGHT HEART CATHETERIZATION N/A 06/13/2013   Procedure: RIGHT HEART CATH;  Surgeon: PJosue Hector MD;  [facility];  Service: Cardiovascular;  Laterality: N/A;   ROBOTIC ASSISTED TOTAL HYSTERECTOMY N/A 01/15/2015   Procedure: ROBOTIC ASSISTED TOTAL HYSTERECTOMY;  Surgeon: SMarvene Staff MD;  [facility];  Service: Gynecology;  Laterality: N/A;   TUBAL LIGATION     WISDOM TOOTH EXTRACTION      Social History:  reports that she has never smoked. She has never used smokeless tobacco. She reports that she does not drink alcohol and does not use drugs. Family History:  Family History  Problem Relation Age of Onset   Hypertension Sister  Hyperlipidemia Sister    Heart disease Brother        History of heart transplant   Hypertension Brother    Hypertension Sister    Hyperlipidemia Sister    Heart disease Brother        Coronary artery disease   Hypertension Brother    Heart disease Brother    Hypertension Brother    Heart disease Brother    Hypertension Brother      HOME MEDICATIONS: Allergies as of 07/11/2022   No Known Allergies       Medication List        Accurate as of July 08, 2022  1:35 PM. If you have any questions, ask your nurse or doctor.          Accu-Chek Aviva Plus test strip Generic drug: glucose blood 1 each 3 (three) times daily.   Accu-Chek Softclix Lancets lancets 1 each 3 (three) times daily.   acetaminophen 325 MG tablet Commonly known as: TYLENOL Take 325-650 mg by mouth every 6 (six) hours as needed for mild pain (depends on pain).   albuterol 108 (90 Base) MCG/ACT inhaler Commonly known as: VENTOLIN HFA Inhale 2 puffs into the lungs every 4 (four) hours as needed for wheezing or shortness of breath.   carvedilol 12.5 MG tablet Commonly known as: COREG TAKE 1 TABLET BY MOUTH 2 TIMES DAILY WITH A MEAL. What changed:  how much to take how to take this when to take this additional instructions   cilostazol 50 MG tablet Commonly known as: PLETAL Take 1 tablet (50 mg total) by mouth 2 (two) times daily.   clopidogrel 75 MG tablet Commonly known as: PLAVIX Take 1 tablet (75 mg total) by mouth daily.   dapagliflozin propanediol 5 MG Tabs tablet Commonly known as: Farxiga Take 1 tablet (5 mg total) by mouth daily before breakfast.   ferrous sulfate 325 (65 FE) MG tablet Take 325 mg by mouth daily with breakfast.   furosemide 40 MG tablet Commonly known as: LASIX Take 1 tablet (40 mg total) by mouth daily. What changed:  how much to take when to take this   glipiZIDE 5 MG tablet Commonly known as: GLUCOTROL Take 1 tablet (5 mg total) by mouth 2 (two) times daily before a meal.   lisinopril 10 MG tablet Commonly known as: ZESTRIL Take 10 mg by mouth daily.   metFORMIN 1000 MG tablet Commonly known as: GLUCOPHAGE Take 1 tablet (1,000 mg total) by mouth 2 (two) times daily.   omega-3 acid ethyl esters 1 g capsule Commonly known as: LOVAZA Take 2 capsules by mouth 2 (two) times daily.   Repatha SureClick 419 MG/ML Soaj Generic drug: Evolocumab INJECT 1 ML  INTO THE SKIN EVERY 14 DAYS   rosuvastatin 40 MG tablet Commonly known as: CRESTOR Take 1 tablet (40 mg total) by mouth at bedtime.   spironolactone 25 MG tablet Commonly known as: ALDACTONE Take 25 mg by mouth daily.   VITAMIN D PO Take 125 mcg by mouth in the morning and at bedtime.         ALLERGIES: No Known Allergies   REVIEW OF SYSTEMS: A comprehensive ROS was conducted with the patient and is negative except as per HPI and below:  Review of Systems  Gastrointestinal:  Negative for diarrhea, nausea and vomiting.      OBJECTIVE:   VITAL SIGNS: LMP 01/01/2015 (Approximate)    PHYSICAL EXAM:  General: Pt appears well and is in NAD  Neck: General: Supple without adenopathy or carotid bruits. Thyroid: Thyroid size normal.  No goiter or nodules appreciated.   Lungs: Clear with good BS bilat with no rales, rhonchi, or wheezes  Heart: RRR with normal S1 and S2 and no gallops; no murmurs; no rub  Abdomen: Normoactive bowel sounds, soft, nontender, without masses or organomegaly palpable  Extremities:  Lower extremities - No pretibial edema. No lesions.  Neuro: MS is good with appropriate affect, pt is alert and Ox3    DM foot exam: 03/07/2022  The skin of the feet is intact without sores or ulcerations. The pedal pulses are 2+ on right and 2+ on left. The sensation is intact to a screening 5.07, 10 gram monofilament bilaterally    DATA REVIEWED:  Lab Results  Component Value Date   HGBA1C 11.4 (A) 03/07/2022   HGBA1C 12.2 (H) 07/01/2021   HGBA1C 11.8 (H) 01/01/2021   Lab Results  Component Value Date   CHOL 88 (L) 10/27/2021   HDL 46 10/27/2021   LDLCALC 20 10/27/2021   TRIG 121 10/27/2021   CHOLHDL 1.9 10/27/2021        Latest Reference Range & Units 03/07/22 08:59  Sodium 135 - 145 mEq/L 138  Potassium 3.5 - 5.1 mEq/L 4.4  Chloride 96 - 112 mEq/L 99  CO2 19 - 32 mEq/L 29  Glucose 70 - 99 mg/dL 332 (H)  BUN 6 - 23 mg/dL 12  Creatinine 0.40 -  1.20 mg/dL 1.25 (H)  Calcium 8.4 - 10.5 mg/dL 10.0  GFR >60.00 mL/min 46.19 (L)    06/2021 BUN/Cr 12/1.31 GFR 46  Gluc 364 mg/dL  Ca 10.1   ASSESSMENT / PLAN / RECOMMENDATIONS:   1) Type 2 Diabetes Mellitus, Poorly controlled, With CKD III complications - Most recent A1c of 11.4 %. Goal A1c < 7.0 %.    Plan: GENERAL: I have discussed with the patient the pathophysiology of diabetes. We stressed the importance of lifestyle changes including diet and exercise. I explained the complications associated with diabetes including retinopathy, nephropathy, neuropathy as well as increased risk of cardiovascular disease. We went over the benefit seen with glycemic control.  I explained to the patient that diabetic patients are at higher than normal risk for amputations.  I offered insulin but she declines  I offered a referral to our CDE as I do think she needs education , pt tells me she does not eat much starch such as potatoes but when asked about what she ate last night, she stated she had nabs ? We discussed importance of avoiding sugar-sweetened beverages and sweets in general and portion controlling CHO intake. Again emphasized the importance of seeing an RD.CDE I am not going to increase Glipizide due to risk of hypoglycemia, I have advised her to bring her meter on next visit  Will add SGLT-2 inhibitors, cautioned against genital infections  BMP shows stable GFR  MEDICATIONS: Continue Metformin 1000 mg Twice a day Continue Glipizide 5 mg, 1 tablet before Breakfast and 1 tablet before Supper  Start Farxiga 5 mg daily   EDUCATION / INSTRUCTIONS: BG monitoring instructions: Patient is instructed to check her blood sugars 1 times a day, fasting . Call [facility] if: BG persistently < 70  I reviewed the Rule of 15 for the treatment of hypoglycemia in detail with the patient. Literature supplied.   2) Diabetic complications:  Eye: Does not have known diabetic  retinopathy.  Neuro/ Feet: Does  have known diabetic peripheral neuropathy. Renal:  Patient does  have known baseline CKD. She is  on an ACEI/ARB at present.Follows with [facility]      Follow-up in 4 months     Signed electronically by: Mack Guise, MD  [facility]  [facility] [address] [place], [place] [address] Phone: [phone] FAX: [phone]   CC: Benito Mccreedy, [address] [place] [address] Phone: [phone]  Fax: [phone]    Return to Endocrinology clinic as below: Future Appointments  Date Time Provider [place]  07/11/2022  8:30 AM Alson Mcpheeters, Melanie Crazier, MD [facility] None  07/18/2022  8:30 AM PCV-NURSE [facility] None  11/16/2022  8:30 AM Patwardhan, Reynold Bowen, MD [facility] None

## 2022-07-11 ENCOUNTER — Ambulatory Visit (INDEPENDENT_AMBULATORY_CARE_PROVIDER_SITE_OTHER): Payer: Medicare HMO | Admitting: Internal Medicine

## 2022-07-11 ENCOUNTER — Encounter: Payer: Self-pay | Admitting: Internal Medicine

## 2022-07-11 VITALS — BP 140/78 | HR 61 | Ht 61.0 in | Wt 177.2 lb

## 2022-07-11 DIAGNOSIS — N1831 Chronic kidney disease, stage 3a: Secondary | ICD-10-CM

## 2022-07-11 DIAGNOSIS — E1165 Type 2 diabetes mellitus with hyperglycemia: Secondary | ICD-10-CM

## 2022-07-11 DIAGNOSIS — E1122 Type 2 diabetes mellitus with diabetic chronic kidney disease: Secondary | ICD-10-CM

## 2022-07-11 DIAGNOSIS — E1142 Type 2 diabetes mellitus with diabetic polyneuropathy: Secondary | ICD-10-CM | POA: Diagnosis not present

## 2022-07-11 LAB — POCT GLYCOSYLATED HEMOGLOBIN (HGB A1C): Hemoglobin A1C: 7.4 % — AB (ref 4.0–5.6)

## 2022-07-11 MED ORDER — DAPAGLIFLOZIN PROPANEDIOL 5 MG PO TABS: 5.0000 mg | ORAL_TABLET | Freq: Every day | ORAL | 2 refills | Status: DC

## 2022-07-11 NOTE — Patient Instructions (Signed)
Continue Metformin 1000 mg Twice a day Continue Glipizide 5 mg, 1 tablet before Breakfast and 1 tablet before Supper  Continue Farxiga 5 mg daily       HOW TO TREAT LOW BLOOD SUGARS (Blood sugar LESS THAN 70 MG/DL) Please follow the RULE OF 15 for the treatment of hypoglycemia treatment (when your (blood sugars are less than 70 mg/dL)   STEP 1: Take 15 grams of carbohydrates when your blood sugar is low, which includes:  3-4 GLUCOSE TABS  OR 3-4 OZ OF JUICE OR REGULAR SODA OR ONE TUBE OF GLUCOSE GEL    STEP 2: RECHECK blood sugar in 15 MINUTES STEP 3: If your blood sugar is still low at the 15 minute recheck --> then, go back to STEP 1 and treat AGAIN with another 15 grams of carbohydrates.

## 2022-07-16 ENCOUNTER — Other Ambulatory Visit: Payer: Self-pay | Admitting: Cardiology

## 2022-07-18 ENCOUNTER — Ambulatory Visit: Payer: Medicare HMO | Admitting: Cardiology

## 2022-07-18 DIAGNOSIS — E782 Mixed hyperlipidemia: Secondary | ICD-10-CM | POA: Diagnosis not present

## 2022-07-18 MED ORDER — EVOLOCUMAB 140 MG/ML ~~LOC~~ SOAJ
140.0000 mg | Freq: Once | SUBCUTANEOUS | Status: AC
  Administered 2022-07-18: 140 mg via SUBCUTANEOUS

## 2022-07-19 NOTE — Progress Notes (Signed)
ICD-10-CM   1. Mixed hyperlipidemia  E78.2 Evolocumab SOAJ 140 mg     Administration Action Time Recorded Time Documented By Site Comment Reason Patient Supplied  Given : 140 mg :   : Subcutaneous 07/18/22 0832 07/18/22 0833 Mares, Lesley Left Lower Abdomen NDC: [id number]  LOT: [id number]  Exp: 04/18/2024  No

## 2022-07-22 DIAGNOSIS — E6609 Other obesity due to excess calories: Secondary | ICD-10-CM | POA: Diagnosis not present

## 2022-07-22 DIAGNOSIS — E1165 Type 2 diabetes mellitus with hyperglycemia: Secondary | ICD-10-CM | POA: Diagnosis not present

## 2022-07-22 DIAGNOSIS — I10 Essential (primary) hypertension: Secondary | ICD-10-CM | POA: Diagnosis not present

## 2022-07-22 DIAGNOSIS — E7800 Pure hypercholesterolemia, unspecified: Secondary | ICD-10-CM | POA: Diagnosis not present

## 2022-07-22 DIAGNOSIS — I5032 Chronic diastolic (congestive) heart failure: Secondary | ICD-10-CM | POA: Diagnosis not present

## 2022-07-22 DIAGNOSIS — J4520 Mild intermittent asthma, uncomplicated: Secondary | ICD-10-CM | POA: Diagnosis not present

## 2022-07-22 DIAGNOSIS — N1831 Chronic kidney disease, stage 3a: Secondary | ICD-10-CM | POA: Diagnosis not present

## 2022-08-01 ENCOUNTER — Ambulatory Visit: Payer: Medicare HMO | Admitting: Cardiology

## 2022-08-01 DIAGNOSIS — I2510 Atherosclerotic heart disease of native coronary artery without angina pectoris: Secondary | ICD-10-CM

## 2022-08-01 DIAGNOSIS — E782 Mixed hyperlipidemia: Secondary | ICD-10-CM

## 2022-08-01 NOTE — Progress Notes (Signed)
Chief Complaint  Patient presents with   Hyperlipidemia    Repatha injection   Chief Complaint  Patient presents with   Hyperlipidemia    Repatha injection   Administration Action Time Recorded Time Documented By Site Comment Reason Patient Supplied  Given : 140 mg :   : Subcutaneous 08/02/22 0854 08/02/22 0856 Chalche-Herrera, Saraii Right Lower Abdomen   Yes

## 2022-08-02 MED ORDER — EVOLOCUMAB 140 MG/ML ~~LOC~~ SOAJ
140.0000 mg | Freq: Once | SUBCUTANEOUS | Status: AC
  Administered 2022-08-02: 140 mg via SUBCUTANEOUS

## 2022-08-12 ENCOUNTER — Other Ambulatory Visit: Payer: Self-pay | Admitting: Cardiology

## 2022-08-15 ENCOUNTER — Ambulatory Visit: Payer: Medicare HMO | Admitting: Cardiology

## 2022-08-15 DIAGNOSIS — E782 Mixed hyperlipidemia: Secondary | ICD-10-CM

## 2022-08-15 MED ORDER — EVOLOCUMAB 140 MG/ML ~~LOC~~ SOAJ
140.0000 mg | Freq: Once | SUBCUTANEOUS | Status: AC
  Administered 2022-08-15: 140 mg via SUBCUTANEOUS

## 2022-08-15 NOTE — Progress Notes (Signed)
ICD-10-CM   1. Mixed hyperlipidemia  E78.2 Evolocumab SOAJ 140 mg     Administration Action Time Recorded Time Documented By Site Comment Reason Patient Supplied  Given : 140 mg :   : Subcutaneous 08/15/22 0849 08/15/22 0850 Mares, Lesley Left Lower Abdomen KZS:[id number]  [id number]  EXP:09/18/2023  No

## 2022-08-29 ENCOUNTER — Ambulatory Visit: Payer: Medicare HMO | Admitting: Cardiology

## 2022-08-29 DIAGNOSIS — E782 Mixed hyperlipidemia: Secondary | ICD-10-CM

## 2022-08-29 MED ORDER — EVOLOCUMAB 140 MG/ML ~~LOC~~ SOAJ
140.0000 mg | Freq: Once | SUBCUTANEOUS | Status: AC
  Administered 2022-08-29: 140 mg via SUBCUTANEOUS

## 2022-08-29 NOTE — Progress Notes (Signed)
Administration Action Time Recorded Time Documented By Site Comment Reason Patient Supplied  Given : 140 mg :   : Subcutaneous 08/29/22 0923 08/29/22 0925 Gaye Alken, CMA Left Lower Abdomen Lot # [id number]  Exp : 02/2024  Yes

## 2022-09-08 ENCOUNTER — Other Ambulatory Visit: Payer: Self-pay | Admitting: Cardiology

## 2022-09-12 ENCOUNTER — Ambulatory Visit: Payer: Medicare HMO | Admitting: Cardiology

## 2022-09-12 DIAGNOSIS — E782 Mixed hyperlipidemia: Secondary | ICD-10-CM | POA: Diagnosis not present

## 2022-09-12 MED ORDER — EVOLOCUMAB 140 MG/ML ~~LOC~~ SOAJ
140.0000 mg | Freq: Once | SUBCUTANEOUS | Status: AC
  Administered 2022-09-12: 140 mg via SUBCUTANEOUS

## 2022-09-13 NOTE — Progress Notes (Signed)
Administration Action Time Recorded Time Documented By Site Comment Reason Patient Supplied  Given : 140 mg :   : Subcutaneous 09/12/22 0845 09/12/22 0846 Mares, Lesley Right Lower Abdomen NDC: 73344-830-15  TZO:[id number]  EXP:09/18/2023  No

## 2022-09-17 DIAGNOSIS — N1831 Chronic kidney disease, stage 3a: Secondary | ICD-10-CM | POA: Diagnosis not present

## 2022-09-17 DIAGNOSIS — E1165 Type 2 diabetes mellitus with hyperglycemia: Secondary | ICD-10-CM | POA: Diagnosis not present

## 2022-09-17 DIAGNOSIS — E785 Hyperlipidemia, unspecified: Secondary | ICD-10-CM | POA: Diagnosis not present

## 2022-09-17 DIAGNOSIS — I119 Hypertensive heart disease without heart failure: Secondary | ICD-10-CM | POA: Diagnosis not present

## 2022-09-26 ENCOUNTER — Ambulatory Visit: Payer: Medicare HMO | Admitting: Cardiology

## 2022-09-26 DIAGNOSIS — E782 Mixed hyperlipidemia: Secondary | ICD-10-CM

## 2022-09-26 MED ORDER — EVOLOCUMAB 140 MG/ML ~~LOC~~ SOAJ
140.0000 mg | Freq: Once | SUBCUTANEOUS | Status: AC
  Administered 2022-09-26: 140 mg via SUBCUTANEOUS

## 2022-09-30 NOTE — Progress Notes (Signed)
Administrations This Visit     Evolocumab SOAJ 140 mg     Admin Date 09/26/2022 Action Given Dose 140 mg Route Subcutaneous Administered By Damita Lack             ICD-10-CM   1. Mixed hyperlipidemia  E78.2 Evolocumab SOAJ 140 mg

## 2022-10-10 ENCOUNTER — Ambulatory Visit: Payer: Medicare HMO | Admitting: Cardiology

## 2022-10-10 DIAGNOSIS — E782 Mixed hyperlipidemia: Secondary | ICD-10-CM

## 2022-10-10 MED ORDER — EVOLOCUMAB 140 MG/ML ~~LOC~~ SOAJ
140.0000 mg | Freq: Once | SUBCUTANEOUS | Status: AC
  Administered 2022-10-10: 140 mg via SUBCUTANEOUS

## 2022-10-10 NOTE — Progress Notes (Signed)
Administrations This Visit     Evolocumab SOAJ 140 mg     Admin Date 10/10/2022 Action Given Dose 140 mg Route Subcutaneous Administered By Damita Lack             ICD-10-CM   1. Mixed hyperlipidemia  E78.2 Evolocumab SOAJ 140 mg

## 2022-10-18 DIAGNOSIS — E1165 Type 2 diabetes mellitus with hyperglycemia: Secondary | ICD-10-CM | POA: Diagnosis not present

## 2022-10-18 DIAGNOSIS — I119 Hypertensive heart disease without heart failure: Secondary | ICD-10-CM | POA: Diagnosis not present

## 2022-10-18 DIAGNOSIS — E785 Hyperlipidemia, unspecified: Secondary | ICD-10-CM | POA: Diagnosis not present

## 2022-10-18 DIAGNOSIS — N1831 Chronic kidney disease, stage 3a: Secondary | ICD-10-CM | POA: Diagnosis not present

## 2022-10-21 DIAGNOSIS — E7800 Pure hypercholesterolemia, unspecified: Secondary | ICD-10-CM | POA: Diagnosis not present

## 2022-10-21 DIAGNOSIS — M79641 Pain in right hand: Secondary | ICD-10-CM | POA: Diagnosis not present

## 2022-10-21 DIAGNOSIS — E6609 Other obesity due to excess calories: Secondary | ICD-10-CM | POA: Diagnosis not present

## 2022-10-21 DIAGNOSIS — I5032 Chronic diastolic (congestive) heart failure: Secondary | ICD-10-CM | POA: Diagnosis not present

## 2022-10-21 DIAGNOSIS — I10 Essential (primary) hypertension: Secondary | ICD-10-CM | POA: Diagnosis not present

## 2022-10-21 DIAGNOSIS — J4520 Mild intermittent asthma, uncomplicated: Secondary | ICD-10-CM | POA: Diagnosis not present

## 2022-10-21 DIAGNOSIS — N1831 Chronic kidney disease, stage 3a: Secondary | ICD-10-CM | POA: Diagnosis not present

## 2022-10-21 DIAGNOSIS — E1165 Type 2 diabetes mellitus with hyperglycemia: Secondary | ICD-10-CM | POA: Diagnosis not present

## 2022-10-24 ENCOUNTER — Ambulatory Visit: Payer: Medicare HMO | Admitting: Cardiology

## 2022-10-24 DIAGNOSIS — E782 Mixed hyperlipidemia: Secondary | ICD-10-CM | POA: Diagnosis not present

## 2022-10-24 DIAGNOSIS — I2510 Atherosclerotic heart disease of native coronary artery without angina pectoris: Secondary | ICD-10-CM

## 2022-10-24 MED ORDER — EVOLOCUMAB 140 MG/ML ~~LOC~~ SOAJ
140.0000 mg | Freq: Once | SUBCUTANEOUS | Status: AC
  Administered 2022-10-24: 140 mg via SUBCUTANEOUS

## 2022-10-24 NOTE — Progress Notes (Signed)
Administrations This Visit     Evolocumab SOAJ 140 mg     Admin Date 10/24/2022 Action Given Dose 140 mg Route Subcutaneous Administered By Damita Lack             ICD-10-CM   1. Mixed hyperlipidemia  E78.2 Evolocumab SOAJ 140 mg    2. Coronary artery disease involving native coronary artery of native heart without angina pectoris  I25.10 Evolocumab SOAJ 140 mg

## 2022-10-26 DIAGNOSIS — M79641 Pain in right hand: Secondary | ICD-10-CM | POA: Diagnosis not present

## 2022-10-26 DIAGNOSIS — M79642 Pain in left hand: Secondary | ICD-10-CM | POA: Diagnosis not present

## 2022-11-07 ENCOUNTER — Ambulatory Visit: Payer: Medicare HMO | Admitting: Cardiology

## 2022-11-07 DIAGNOSIS — E782 Mixed hyperlipidemia: Secondary | ICD-10-CM

## 2022-11-07 MED ORDER — EVOLOCUMAB 140 MG/ML ~~LOC~~ SOAJ
140.0000 mg | Freq: Once | SUBCUTANEOUS | Status: AC
  Administered 2022-11-07: 140 mg via SUBCUTANEOUS

## 2022-11-07 MED ORDER — REPATHA SURECLICK 140 MG/ML ~~LOC~~ SOAJ: SUBCUTANEOUS | 3 refills | Status: DC

## 2022-11-08 NOTE — Progress Notes (Signed)
  All Administrations of Evolocumab SOAJ 140 mg   Administration Action Time Recorded Time Documented By Site Comment Reason Patient Supplied  Given : 140 mg :   : Subcutaneous 11/07/22 0836 11/07/22 0837 [name] Left Lower Abdomen NDC: 41991-444-58 EXP: 03/18/2025 LOT: [id number]  No    Patient came to the office to get her Repatha injection. Patient will return in 2 week for her other Repatha injection.

## 2022-11-14 ENCOUNTER — Other Ambulatory Visit: Payer: Self-pay

## 2022-11-14 MED ORDER — REPATHA SURECLICK 140 MG/ML ~~LOC~~ SOAJ: SUBCUTANEOUS | 3 refills | Status: DC

## 2022-11-15 NOTE — Progress Notes (Unsigned)
Patient referred by Benito Mccreedy, MD for abnormal ABI  Subjective:   [facility], female    DOB: [birth year], 61 y.o.   MRN: [id number]   No chief complaint on file.   HPI  61 y/o African-American female with hypertension, type 2 diabetes mellitus, hyperlipidemia, coronary artery disease, PAD, nonischemic cardiomyopathy with now recovered EF, pulmonary hypertension  Patient denies any any cardiac complaints, claudication is controlled. Blood pressure mildly elevated today, but much lower at home. Diabetes remains very uncontrolled. .  Current Outpatient Medications:  .  ACCU-CHEK AVIVA PLUS test strip, 1 each 3 (three) times daily., Disp: , Rfl:  .  Accu-Chek Softclix Lancets lancets, 1 each 3 (three) times daily., Disp: , Rfl:  .  acetaminophen (TYLENOL) 325 MG tablet, Take 325-650 mg by mouth every 6 (six) hours as needed for mild pain (depends on pain)., Disp: , Rfl:  .  albuterol (PROVENTIL HFA;VENTOLIN HFA) 108 (90 BASE) MCG/ACT inhaler, Inhale 2 puffs into the lungs every 4 (four) hours as needed for wheezing or shortness of breath., Disp: , Rfl:  .  carvedilol (COREG) 12.5 MG tablet, TAKE 1 TABLET BY MOUTH 2 TIMES DAILY WITH A MEAL. (Patient taking differently: Take 12.5 mg by mouth 2 (two) times daily with a meal.), Disp: 60 tablet, Rfl: 11 .  Cholecalciferol (VITAMIN D PO), Take 125 mcg by mouth in the morning and at bedtime. , Disp: , Rfl:  .  cilostazol (PLETAL) 50 MG tablet, Take 1 tablet (50 mg total) by mouth 2 (two) times daily., Disp: 60 tablet, Rfl: 2 .  clopidogrel (PLAVIX) 75 MG tablet, Take 1 tablet (75 mg total) by mouth daily., Disp: 90 tablet, Rfl: 3 .  dapagliflozin propanediol (FARXIGA) 5 MG TABS tablet, Take 1 tablet (5 mg total) by mouth daily before breakfast., Disp: 90 tablet, Rfl: 2 .  Evolocumab (REPATHA SURECLICK) 045 MG/ML SOAJ, INJECT 1 ML INTO THE SKIN ONCE EVERY 14 DAYS, Disp: 6 mL, Rfl: 3 .  ferrous sulfate 325 (65 FE) MG tablet, Take 325  mg by mouth daily with breakfast., Disp: , Rfl:  .  furosemide (LASIX) 40 MG tablet, Take 1 tablet (40 mg total) by mouth daily. (Patient taking differently: Take 20 mg by mouth 2 (two) times daily.), Disp: 30 tablet, Rfl: 11 .  glipiZIDE (GLUCOTROL) 5 MG tablet, Take 1 tablet (5 mg total) by mouth 2 (two) times daily before a meal., Disp: 180 tablet, Rfl: 2 .  lisinopril (ZESTRIL) 10 MG tablet, Take 10 mg by mouth daily. , Disp: , Rfl:  .  metFORMIN (GLUCOPHAGE) 1000 MG tablet, Take 1 tablet (1,000 mg total) by mouth 2 (two) times daily., Disp: 180 tablet, Rfl: 1 .  omega-3 acid ethyl esters (LOVAZA) 1 g capsule, Take 2 capsules by mouth 2 (two) times daily., Disp: , Rfl:  .  rosuvastatin (CRESTOR) 40 MG tablet, Take 1 tablet (40 mg total) by mouth at bedtime., Disp: 30 tablet, Rfl: 11 .  spironolactone (ALDACTONE) 25 MG tablet, Take 25 mg by mouth daily., Disp: , Rfl:   Cardiovascular and other pertinent studies:  EKG 05/10/2022: Sinus rhythm 60 bpm Poor R-wave progression Consider old anterior infarct Nonspecific T-abnormality Low voltage  Echocardiogram 03/09/2020:  Normal LV systolic function with visual EF 55-60%. Left ventricle cavity  is normal in size. Moderate left ventricular hypertrophy. Normal global  wall motion. No obvious regional wall motion abnormalities. Doppler  evidence of grade I diastolic dysfunction, elevated LAP. Calculated EF  55%.  No significant valvular abnormalities.  IVC is normal with a respiratory response of <50%.  No significant change compared to prior study dated 11/2015 except Grade I  diastolic dysfunction is new.   ABI 02/06/2020: Rt 0.68 Lt 0.88  LHC/RHC 06/2013: LM: No significant disease LAD: 50% mid LAD stenosis with distal tapering.  LCx: Moderate early OM1 with 50% ostial stenosis. Mid LCx with 40% stenosis.  RCA: Diffuse RCA disease with 70-80% ostial stenosis and damping of the waveform; serial 50% stenoses in the mid and distal RCA.   Reviewed with Dr. Angelena Form.  70-80% stenosis with ostial damping in setting of relatively small caliber RCA.  The RCA is diffusely disease.  In the absence of ACS (no chest pain), would treat medically  Left ventriculography: Global hypokinesis with EF estimated at 35%.  Optimized PCWP on current dose of Lasix (16 mmHg).   Pulmonary arterial HTN with elevated PVR, not responsive to adenosine.  Given co-existing LV dysfunction, I am going to try her on sildenafil 20 mg tid.   She will followup in clinic in about 2 wks.   [place] 05/2013: Mean RA:  20  mmHg RV: 76/12 mmHg PA: 75/36 mmHg with a mean of 49 mmHg PCWP: 27 mmHg   Ao sat : 94% PA sat 50%    Fick Cardiac Output:  3.1 L/minute,  1.8 L/min minute INR INR ute/M2   PVR: 8.0 wood unites 567 dysnes-sec cm5   Impression:  PAD significantly higher than PCWP indicating some intrinsic pulmonary hypertension.  Increase diuretics and add nitrates Refer to CHF clinic and continue f/u with pulmonary ? [place]  Recent labs: 03/07/2022: Glucose 332, BUN/Cr 12/1.25. EGFR 46. Na/K 138/4.4.  HbA1C 11.4%  10/27/2021: Chol 88, TG 121, HDL 46, LDL 20  07/01/2021: Chol 189, TG 160, HDL 39, LDL 121 HbA1C 12.2%  01/01/2021: HbA1C 11.8%  11/20/2020: Glucose 413, BUN/Cr 10/1.15. EGFR 54. Na/K 138/3.8. T.bili 1.6. Rest of the CMP normal H/H 13/40. MCV 86. Platelets 380  01/09/2020: Glucose 150, BUN/Cr 8/0.89. EGFR 81. Na/K 144/4.  Rest of the CMP normal H/H 12.0/36.5. MCV 85. Platelets 405 HbA1C 6.9% Chol 160, TG 108, HDL 43, LDL 97    Review of Systems  Cardiovascular: Positive for claudication. Negative for chest pain, dyspnea on exertion, leg swelling, palpitations and syncope.       There were no vitals filed for this visit.    There is no height or weight on file to calculate BMI. There were no vitals filed for this visit.    Objective:    Physical Exam Vitals and nursing note reviewed.  Constitutional:      General: She  is not in acute distress. Neck:     Vascular: No JVD.  Cardiovascular:     Rate and Rhythm: Normal rate and regular rhythm.     Pulses: Intact distal pulses.          Femoral pulses are 1+ on the right side and 1+ on the left side.      Dorsalis pedis pulses are 0 on the right side and 1+ on the left side.       Posterior tibial pulses are 0 on the right side and 0 on the left side.     Heart sounds: Normal heart sounds. No murmur heard. Pulmonary:     Effort: Pulmonary effort is normal.     Breath sounds: Normal breath sounds. No wheezing or rales.  Musculoskeletal:     Right lower leg:  No edema.     Left lower leg: No edema.           Assessment & Recommendations:   61 y/o African-American female with hypertension, type 2 diabetes mellitus, hyperlipidemia, coronary artery disease, PAD, nonischemic cardiomyopathy with now recovered EF, pulmonary hypertension  Hypertension: Generally well controlled. No change made today.  PAD: No critical limb ischemia.  Abnormal ABI Rt 0.68, Lt 0.78 (01/2020). Rutherford class III claudication, controlled with medical therapy. Continue plavix 75 mg daily, Pletal 50 mg bid. Needs aggressive risk factor modification, especially diabetes and hyperlipidemia, see below.    Mixed hyperlipidemia: LDL down to 21 on Repatha and Crestor.   Uncontrolled type 2 DM: Still remains extremely uncontrolled with A1C of 11.4%, and inadequately treated-only on metformin. Continue f/u w/endocrinology.  Coronary artery disease: No angina symptoms at this time.  Continue medical therapy.  F/u in 6 months  Abdulahad Mederos Esther Hardy, MD [facility] [place] Pager: [phone] Office: [phone]

## 2022-11-16 ENCOUNTER — Encounter: Payer: Self-pay | Admitting: Cardiology

## 2022-11-16 ENCOUNTER — Ambulatory Visit: Payer: Medicare HMO | Admitting: Cardiology

## 2022-11-16 VITALS — BP 131/69 | HR 59 | Resp 16 | Ht 61.0 in | Wt 174.0 lb

## 2022-11-16 DIAGNOSIS — I10 Essential (primary) hypertension: Secondary | ICD-10-CM | POA: Diagnosis not present

## 2022-11-16 DIAGNOSIS — I739 Peripheral vascular disease, unspecified: Secondary | ICD-10-CM | POA: Diagnosis not present

## 2022-11-16 DIAGNOSIS — E1165 Type 2 diabetes mellitus with hyperglycemia: Secondary | ICD-10-CM | POA: Diagnosis not present

## 2022-11-16 DIAGNOSIS — E782 Mixed hyperlipidemia: Secondary | ICD-10-CM | POA: Diagnosis not present

## 2022-11-16 DIAGNOSIS — I2510 Atherosclerotic heart disease of native coronary artery without angina pectoris: Secondary | ICD-10-CM

## 2022-11-16 MED ORDER — ASPIRIN 81 MG PO TBEC: 81.0000 mg | DELAYED_RELEASE_TABLET | Freq: Every day | ORAL | 3 refills | Status: DC

## 2022-11-21 ENCOUNTER — Ambulatory Visit: Payer: Medicare HMO | Admitting: Cardiology

## 2022-11-21 DIAGNOSIS — E782 Mixed hyperlipidemia: Secondary | ICD-10-CM | POA: Diagnosis not present

## 2022-11-21 MED ORDER — EVOLOCUMAB 140 MG/ML ~~LOC~~ SOAJ: 140.0000 mg | Freq: Once | SUBCUTANEOUS | Status: DC

## 2022-11-21 MED ORDER — EVOLOCUMAB 140 MG/ML ~~LOC~~ SOAJ
140.0000 mg | Freq: Once | SUBCUTANEOUS | Status: AC
  Administered 2022-11-21: 140 mg via SUBCUTANEOUS

## 2022-11-21 NOTE — Progress Notes (Signed)
Administrations This Visit     Evolocumab SOAJ 140 mg     Admin Date 11/21/2022 Action Given Dose 140 mg Route Subcutaneous Administered By Damita Lack              ICD-10-CM   1. Mixed hyperlipidemia  E78.2 Evolocumab SOAJ 140 mg    DISCONTINUED: Evolocumab SOAJ 140 mg

## 2022-11-28 ENCOUNTER — Encounter: Payer: Self-pay | Admitting: Internal Medicine

## 2022-11-28 ENCOUNTER — Ambulatory Visit (INDEPENDENT_AMBULATORY_CARE_PROVIDER_SITE_OTHER): Payer: Medicare HMO | Admitting: Internal Medicine

## 2022-11-28 ENCOUNTER — Telehealth: Payer: Self-pay

## 2022-11-28 VITALS — BP 116/72 | HR 76 | Ht 61.0 in | Wt 174.0 lb

## 2022-11-28 DIAGNOSIS — N1831 Chronic kidney disease, stage 3a: Secondary | ICD-10-CM

## 2022-11-28 DIAGNOSIS — N179 Acute kidney failure, unspecified: Secondary | ICD-10-CM | POA: Diagnosis not present

## 2022-11-28 DIAGNOSIS — N184 Chronic kidney disease, stage 4 (severe): Secondary | ICD-10-CM

## 2022-11-28 DIAGNOSIS — E1122 Type 2 diabetes mellitus with diabetic chronic kidney disease: Secondary | ICD-10-CM | POA: Diagnosis not present

## 2022-11-28 DIAGNOSIS — E1165 Type 2 diabetes mellitus with hyperglycemia: Secondary | ICD-10-CM

## 2022-11-28 DIAGNOSIS — E1159 Type 2 diabetes mellitus with other circulatory complications: Secondary | ICD-10-CM | POA: Diagnosis not present

## 2022-11-28 DIAGNOSIS — E1142 Type 2 diabetes mellitus with diabetic polyneuropathy: Secondary | ICD-10-CM | POA: Diagnosis not present

## 2022-11-28 LAB — MICROALBUMIN / CREATININE URINE RATIO
Creatinine,U: 93 mg/dL
Microalb Creat Ratio: 14.8 mg/g (ref 0.0–30.0)
Microalb, Ur: 13.8 mg/dL — ABNORMAL HIGH (ref 0.0–1.9)

## 2022-11-28 LAB — BASIC METABOLIC PANEL
BUN: 25 mg/dL — ABNORMAL HIGH (ref 6–23)
CO2: 27 mEq/L (ref 19–32)
Calcium: 9.7 mg/dL (ref 8.4–10.5)
Chloride: 101 mEq/L (ref 96–112)
Creatinine, Ser: 3.11 mg/dL — ABNORMAL HIGH (ref 0.40–1.20)
GFR: 15.39 mL/min — ABNORMAL LOW (ref >60.00)
Glucose, Bld: 223 mg/dL — ABNORMAL HIGH (ref 70–99)
Potassium: 4.2 mEq/L (ref 3.5–5.1)
Sodium: 138 mEq/L (ref 135–145)

## 2022-11-28 LAB — POCT GLYCOSYLATED HEMOGLOBIN (HGB A1C): Hemoglobin A1C: 10.3 % — AB (ref 4.0–5.6)

## 2022-11-28 MED ORDER — EMPAGLIFLOZIN 25 MG PO TABS: 25.0000 mg | ORAL_TABLET | Freq: Every day | ORAL | 3 refills | Status: DC

## 2022-11-28 MED ORDER — GLIPIZIDE 5 MG PO TABS: 10.0000 mg | ORAL_TABLET | Freq: Two times a day (BID) | ORAL | 3 refills | Status: DC

## 2022-11-28 NOTE — Progress Notes (Signed)
Name: [name]  MRN/ DOB: [id number], [birth year]   Age/ Sex: 61 y.o., female    PCP: Benito Mccreedy, MD   Reason for Endocrinology Evaluation: Type 2 Diabetes Mellitus     Date of Initial Endocrinology Visit: 03/07/2022    PATIENT IDENTIFIER: [name] is a 61 y.o. female with a past medical history of T2DM, CKD, CHF, HTN , CAD and dyslipidemia . The patient presented for initial endocrinology clinic visit on 03/07/2022 for consultative assistance with her diabetes management.    HPI: [name] was    Diagnosed with DM ~ 9 yrs  Hemoglobin A1c has ranged from 11.8% in 12/2020, peaking at 12.2% in 06/2021.   Follows with [facility]   On her initial visit her A1c was 11.4%, she was already on metformin, and glipizide, she declined insulin.  We started her on Farxiga SUBJECTIVE:   During the last visit (07/11/2022): A1c 7.4%       Today (11/28/22): [name] is here for follow-up on diabetes management.she checks her blood sugars occasionally. The patient has not had hypoglycemic episodes since the last clinic visit.  She continues to follow-up with cardiology last visit was 11/21/2022 for mixed hyperlipidemia She was also seen by [name] for abnormal ABI 11/16/2022, controlled on medical therapy  She eats bread in the form of sandwiches  She has stopped drinking juices   Continues with intermittent UTI's   HOME DIABETES REGIMEN: Metformin 1000 mg BID  Glipizide 5 mg daily BID  Farxiga 5 mg daily     Statin: yes ACE-I/ARB: yes    METER DOWNLOAD SUMMARY: unable to download   218-314 mg/dL    DIABETIC COMPLICATIONS: Microvascular complications:  CKD III , neuropathy  Denies: retinopathy  Last eye exam: Completed 02/2022  Macrovascular complications:  CHF, CVA , PAD   PAST HISTORY: Past Medical History:  Past Medical History:  Diagnosis Date  . Asthma    prn - occasional uses inhaler  . Chronic systolic CHF  (congestive heart failure) ([place])   . Diabetes mellitus without complication (HCC)    borderline, being monitored, no meds  . Hypercholesterolemia   . Hypertension   . Microcytic anemia   . Nonischemic cardiomyopathy ([place])    a. 2005 Cath: nonobstructive dzs;  b. 04/2013 Echo: EF 3035%, diff HK, restrictive physiology, mildly dil LA, PASP 34mHg.  . SVD (spontaneous vaginal delivery)    x 3   Past Surgical History:  Past Surgical History:  Procedure Laterality Date  . BILATERAL SALPINGECTOMY Bilateral 01/15/2015   Procedure: BILATERAL SALPINGECTOMY;  Surgeon: SMarvene Staff MD;  [facility];  Service: Gynecology;  Laterality: Bilateral;  . CORONARY ANGIOPLASTY  2005  . LEFT AND RIGHT HEART CATHETERIZATION WITH CORONARY ANGIOGRAM N/A 07/05/2013   Procedure: LEFT AND RIGHT HEART CATHETERIZATION WITH CORONARY ANGIOGRAM;  Surgeon: DLarey Dresser MD;  [facility];  Service: Cardiovascular;  Laterality: N/A;  . RIGHT HEART CATHETERIZATION N/A 06/13/2013   Procedure: RIGHT HEART CATH;  Surgeon: PJosue Hector MD;  [facility];  Service: Cardiovascular;  Laterality: N/A;  . ROBOTIC ASSISTED TOTAL HYSTERECTOMY N/A 01/15/2015   Procedure: ROBOTIC ASSISTED TOTAL HYSTERECTOMY;  Surgeon: SMarvene Staff MD;  [facility];  Service: Gynecology;  Laterality: N/A;  . TUBAL LIGATION    . WISDOM TOOTH EXTRACTION      Social History:  reports that she has never smoked. She has never used smokeless tobacco. She reports that she does not  drink alcohol and does not use drugs. Family History:  Family History  Problem Relation Age of Onset  . Hypertension Sister   . Hyperlipidemia Sister   . Heart disease Brother        History of heart transplant  . Hypertension Brother   . Hypertension Sister   . Hyperlipidemia Sister   . Heart disease Brother        Coronary artery disease  . Hypertension Brother   . Heart disease Brother   . Hypertension Brother   . Heart  disease Brother   . Hypertension Brother      HOME MEDICATIONS: Allergies as of 11/28/2022   No Known Allergies      Medication List        Accurate as of November 28, 2022  9:03 AM. If you have any questions, ask your nurse or doctor.          Accu-Chek Aviva Plus test strip Generic drug: glucose blood 1 each 3 (three) times daily.   Accu-Chek Softclix Lancets lancets 1 each 3 (three) times daily.   acetaminophen 325 MG tablet Commonly known as: TYLENOL Take 325-650 mg by mouth every 6 (six) hours as needed for mild pain (depends on pain).   albuterol 108 (90 Base) MCG/ACT inhaler Commonly known as: VENTOLIN HFA Inhale 2 puffs into the lungs every 4 (four) hours as needed for wheezing or shortness of breath.   aspirin EC 81 MG tablet Take 1 tablet (81 mg total) by mouth daily. Swallow whole.   carvedilol 12.5 MG tablet Commonly known as: COREG TAKE 1 TABLET BY MOUTH 2 TIMES DAILY WITH A MEAL. What changed:  how much to take how to take this when to take this additional instructions   cilostazol 50 MG tablet Commonly known as: PLETAL Take 1 tablet (50 mg total) by mouth 2 (two) times daily.   dapagliflozin propanediol 5 MG Tabs tablet Commonly known as: Farxiga Take 1 tablet (5 mg total) by mouth daily before breakfast.   ezetimibe 10 MG tablet Commonly known as: ZETIA Take 10 mg by mouth daily.   ferrous sulfate 325 (65 FE) MG tablet Take 325 mg by mouth daily with breakfast.   furosemide 40 MG tablet Commonly known as: LASIX Take 1 tablet (40 mg total) by mouth daily. What changed:  how much to take when to take this   glipiZIDE 5 MG tablet Commonly known as: GLUCOTROL Take 1 tablet (5 mg total) by mouth 2 (two) times daily before a meal.   lisinopril 10 MG tablet Commonly known as: ZESTRIL Take 10 mg by mouth daily.   metFORMIN 1000 MG tablet Commonly known as: GLUCOPHAGE Take 1 tablet (1,000 mg total) by mouth 2 (two) times daily.    omega-3 acid ethyl esters 1 g capsule Commonly known as: LOVAZA Take 2 capsules by mouth 2 (two) times daily.   Repatha SureClick 025 MG/ML Soaj Generic drug: Evolocumab INJECT 1 ML INTO THE SKIN ONCE EVERY 14 DAYS   rosuvastatin 40 MG tablet Commonly known as: CRESTOR Take 1 tablet (40 mg total) by mouth at bedtime.   spironolactone 25 MG tablet Commonly known as: ALDACTONE Take 25 mg by mouth daily.   VITAMIN D PO Take 125 mcg by mouth in the morning and at bedtime.         ALLERGIES: No Known Allergies       OBJECTIVE:   VITAL SIGNS: BP 116/72 (BP Location: Left Arm, Patient Position: Sitting, Cuff Size:  Large)   Pulse 76   Ht '5\' 1"'$  (1.549 m)   Wt 174 lb (78.9 kg)   LMP 01/01/2015 (Approximate)   SpO2 95%   BMI 32.88 kg/m    PHYSICAL EXAM:  General: Pt appears well and is in NAD  Neck: General: Supple without adenopathy or carotid bruits. Thyroid: Thyroid size normal.  No goiter or nodules appreciated.   Lungs: Clear with good BS bilat with no rales, rhonchi, or wheezes  Heart: RRR   Extremities:  Lower extremities - No pretibial edema. No lesions.  Neuro: MS is good with appropriate affect, pt is alert and Ox3    DM foot exam: 03/07/2022  The skin of the feet is intact without sores or ulcerations. The pedal pulses are 2+ on right and 2+ on left. The sensation is intact to a screening 5.07, 10 gram monofilament bilaterally    DATA REVIEWED:  Lab Results  Component Value Date   HGBA1C 10.3 (A) 11/28/2022   HGBA1C 7.4 (A) 07/11/2022   HGBA1C 11.4 (A) 03/07/2022   Lab Results  Component Value Date   CHOL 88 (L) 10/27/2021   HDL 46 10/27/2021   LDLCALC 20 10/27/2021   TRIG 121 10/27/2021   CHOLHDL 1.9 10/27/2021        Latest Reference Range & Units 03/07/22 08:59  Sodium 135 - 145 mEq/L 138  Potassium 3.5 - 5.1 mEq/L 4.4  Chloride 96 - 112 mEq/L 99  CO2 19 - 32 mEq/L 29  Glucose 70 - 99 mg/dL 332 (H)  BUN 6 - 23 mg/dL 12   Creatinine 0.40 - 1.20 mg/dL 1.25 (H)  Calcium 8.4 - 10.5 mg/dL 10.0  GFR >60.00 mL/min 46.19 (L)     ASSESSMENT / PLAN / RECOMMENDATIONS:   1) Type 2 Diabetes Mellitus, Poorly Controlled , With neuropathic CKD III and macrovascular  complications - Most recent A1c of 10.3 %. Goal A1c < 7.0 %.    A1c increased from 7.4% to 10.3% , this is due to dietary indiscretions  We also emphasized the importance of glucose checks at home , she is not checking as often possibly once every 2 weeks. Pt to check 2-3x a week  BMP shows stable GFR is worsening  Will increase Glipizide as below  Per insurance will switch [place] to [place] and increase    MEDICATIONS: Stop  Metformin 1000 mg Twice a day Increase  Glipizide 5 mg, 2 tablet before Breakfast and 2 tablet before Supper  Stop  Farxiga/Jardiance 25 mg daily (per insurance)     EDUCATION / INSTRUCTIONS: BG monitoring instructions: Patient is instructed to check her blood sugars 1 times a day, fasting . Call [facility] if: BG persistently < 70  I reviewed the Rule of 15 for the treatment of hypoglycemia in detail with the patient. Literature supplied.   2) Diabetic complications:  Eye: Does not have known diabetic retinopathy.  Neuro/ Feet: Does  have known diabetic peripheral neuropathy. Renal: Patient does  have known baseline CKD. She is  on an ACEI/ARB at present.Follows with [facility]    3) Acute on chronic kidney disease:   -Hyperglycemia is a huge contributing factor -GFR decreased from 43 to 15 on today's labs -Will stop metformin and Farxiga/Jardiance but continue with glipizide -Referral has been placed to [facility] as she is an established patient there   Follow-up in 4 months  Attempted to call the patient on 11/28/2022 at 1420, no answer, unable to leave  a message because voicemail was full.  Sent a portal message and a referral has been placed to nephrology   Signed  electronically by: Mack Guise, MD  [name]  [facility] [address] [place], [place] [address] Phone: [phone] FAX: [phone]   CC: Benito Mccreedy, [address] [place] [address] Phone: [phone]  Fax: [phone]    Return to Endocrinology clinic as below: Future Appointments  Date Time Provider [place]  12/05/2022  8:30 AM PCV-NURSE [facility] None  05/17/2023  8:30 AM Patwardhan, Reynold Bowen, MD [facility] None

## 2022-11-28 NOTE — Telephone Encounter (Signed)
Patient has been advised on results and will stop medications as advised. Patient will also wait for appointment with kidney doctor.

## 2022-11-28 NOTE — Patient Instructions (Signed)
  Increase  Glipizide 5 mg, 2 tablets before Breakfast and 2 tablets before Supper  Switch  Farxiga to Jardiance 25 mg, 1 tablet every morning per insurance request  Continue Metformin 1000 mg Twice a day      HOW TO TREAT LOW BLOOD SUGARS (Blood sugar LESS THAN 70 MG/DL) Please follow the RULE OF 15 for the treatment of hypoglycemia treatment (when your (blood sugars are less than 70 mg/dL)   STEP 1: Take 15 grams of carbohydrates when your blood sugar is low, which includes:  3-4 GLUCOSE TABS  OR 3-4 OZ OF JUICE OR REGULAR SODA OR ONE TUBE OF GLUCOSE GEL    STEP 2: RECHECK blood sugar in 15 MINUTES STEP 3: If your blood sugar is still low at the 15 minute recheck --> then, go back to STEP 1 and treat AGAIN with another 15 grams of carbohydrates.

## 2022-12-05 ENCOUNTER — Ambulatory Visit: Payer: Medicare HMO | Admitting: Cardiology

## 2022-12-05 DIAGNOSIS — E782 Mixed hyperlipidemia: Secondary | ICD-10-CM | POA: Diagnosis not present

## 2022-12-05 MED ORDER — EVOLOCUMAB 140 MG/ML ~~LOC~~ SOAJ
140.0000 mg | Freq: Once | SUBCUTANEOUS | Status: AC
  Administered 2022-12-05: 140 mg via SUBCUTANEOUS

## 2022-12-05 NOTE — Progress Notes (Signed)
Evolocumab SOAJ 140 mgDose: 140 mg  :  Subcutaneous  :   Once Order ID: [id number]  Ordered Admin Dose: 140 mg  Frequency: Once  Route: Subcutaneous  Ordered Dose: 140 mg  Last Admin: Today 12/05/22 at 0944 (Given)  Order Start Time: Today 12/05/22 at [place] End Time: Today 12/05/22 at 6725   No Administrations Remaining  References: Micromedex Drug Information   Recent Actions: 12/18 0944 Given    Recent Admin Within 30 Days: 12/05/22 0944 (Given)   Given : 12/05/22 : 140 mg : Left Lower Abdomen New Administration

## 2022-12-18 DIAGNOSIS — E785 Hyperlipidemia, unspecified: Secondary | ICD-10-CM | POA: Diagnosis not present

## 2022-12-18 DIAGNOSIS — N1831 Chronic kidney disease, stage 3a: Secondary | ICD-10-CM | POA: Diagnosis not present

## 2022-12-18 DIAGNOSIS — E1165 Type 2 diabetes mellitus with hyperglycemia: Secondary | ICD-10-CM | POA: Diagnosis not present

## 2022-12-18 DIAGNOSIS — I119 Hypertensive heart disease without heart failure: Secondary | ICD-10-CM | POA: Diagnosis not present

## 2022-12-21 ENCOUNTER — Ambulatory Visit: Payer: Medicare HMO | Admitting: Cardiology

## 2022-12-21 DIAGNOSIS — E782 Mixed hyperlipidemia: Secondary | ICD-10-CM

## 2022-12-21 MED ORDER — EVOLOCUMAB 140 MG/ML ~~LOC~~ SOAJ
140.0000 mg | Freq: Once | SUBCUTANEOUS | Status: AC
  Administered 2022-12-21: 140 mg via SUBCUTANEOUS

## 2022-12-21 NOTE — Progress Notes (Signed)
ICD-10-CM   1. Mixed hyperlipidemia  E78.2 Evolocumab SOAJ 140 mg     Administrations This Visit     Evolocumab SOAJ 140 mg     Admin Date 12/21/2022 Action Given Dose 140 mg Route Subcutaneous Administered By Gaye Alken, [place]

## 2023-01-04 ENCOUNTER — Ambulatory Visit: Payer: Medicare HMO | Admitting: Cardiology

## 2023-01-04 DIAGNOSIS — E782 Mixed hyperlipidemia: Secondary | ICD-10-CM

## 2023-01-04 MED ORDER — EVOLOCUMAB 140 MG/ML ~~LOC~~ SOAJ
140.0000 mg | Freq: Once | SUBCUTANEOUS | Status: AC
  Administered 2023-01-04: 140 mg via SUBCUTANEOUS

## 2023-01-04 NOTE — Progress Notes (Signed)
Evolocumab SOAJ 140 mgDose: 140 mg  :  Subcutaneous  :   Once Order ID: [id number]  Ordered Admin Dose: 140 mg  Last Admin: Today 01/04/23 at 0829 (Given)    Given : 01/04/23 : 140 mg : Right Lower Abdomen

## 2023-01-18 ENCOUNTER — Ambulatory Visit: Payer: Medicare HMO | Admitting: Cardiology

## 2023-01-18 DIAGNOSIS — I2510 Atherosclerotic heart disease of native coronary artery without angina pectoris: Secondary | ICD-10-CM

## 2023-01-18 DIAGNOSIS — I739 Peripheral vascular disease, unspecified: Secondary | ICD-10-CM

## 2023-01-18 MED ORDER — EVOLOCUMAB 140 MG/ML ~~LOC~~ SOAJ
140.0000 mg | Freq: Once | SUBCUTANEOUS | Status: AC
  Administered 2023-01-18: 140 mg via SUBCUTANEOUS

## 2023-01-18 NOTE — Progress Notes (Signed)
Evolocumab SOAJ 140 mgDose: 140 mg  :  Subcutaneous  :   Once Order ID: [id number]  Ordered Admin Dose: 140 mg  Last Admin: Today 01/18/23 at 0831 (Given)    Given : 01/18/23 : 140 mg : Left Lower Abdomen

## 2023-01-20 DIAGNOSIS — J4520 Mild intermittent asthma, uncomplicated: Secondary | ICD-10-CM | POA: Diagnosis not present

## 2023-01-20 DIAGNOSIS — Z0000 Encounter for general adult medical examination without abnormal findings: Secondary | ICD-10-CM | POA: Diagnosis not present

## 2023-01-20 DIAGNOSIS — N1831 Chronic kidney disease, stage 3a: Secondary | ICD-10-CM | POA: Diagnosis not present

## 2023-01-20 DIAGNOSIS — E7800 Pure hypercholesterolemia, unspecified: Secondary | ICD-10-CM | POA: Diagnosis not present

## 2023-01-20 DIAGNOSIS — I5032 Chronic diastolic (congestive) heart failure: Secondary | ICD-10-CM | POA: Diagnosis not present

## 2023-01-20 DIAGNOSIS — I10 Essential (primary) hypertension: Secondary | ICD-10-CM | POA: Diagnosis not present

## 2023-01-20 DIAGNOSIS — E6609 Other obesity due to excess calories: Secondary | ICD-10-CM | POA: Diagnosis not present

## 2023-01-20 DIAGNOSIS — E1165 Type 2 diabetes mellitus with hyperglycemia: Secondary | ICD-10-CM | POA: Diagnosis not present

## 2023-01-27 DIAGNOSIS — J4520 Mild intermittent asthma, uncomplicated: Secondary | ICD-10-CM | POA: Diagnosis not present

## 2023-01-27 DIAGNOSIS — E6609 Other obesity due to excess calories: Secondary | ICD-10-CM | POA: Diagnosis not present

## 2023-01-27 DIAGNOSIS — I10 Essential (primary) hypertension: Secondary | ICD-10-CM | POA: Diagnosis not present

## 2023-01-27 DIAGNOSIS — E7800 Pure hypercholesterolemia, unspecified: Secondary | ICD-10-CM | POA: Diagnosis not present

## 2023-01-27 DIAGNOSIS — E1165 Type 2 diabetes mellitus with hyperglycemia: Secondary | ICD-10-CM | POA: Diagnosis not present

## 2023-01-27 DIAGNOSIS — N1831 Chronic kidney disease, stage 3a: Secondary | ICD-10-CM | POA: Diagnosis not present

## 2023-01-27 DIAGNOSIS — I5032 Chronic diastolic (congestive) heart failure: Secondary | ICD-10-CM | POA: Diagnosis not present

## 2023-02-01 ENCOUNTER — Ambulatory Visit: Payer: Medicare HMO | Admitting: Cardiology

## 2023-02-01 DIAGNOSIS — E782 Mixed hyperlipidemia: Secondary | ICD-10-CM

## 2023-02-01 DIAGNOSIS — E1142 Type 2 diabetes mellitus with diabetic polyneuropathy: Secondary | ICD-10-CM | POA: Diagnosis not present

## 2023-02-01 DIAGNOSIS — I739 Peripheral vascular disease, unspecified: Secondary | ICD-10-CM

## 2023-02-01 DIAGNOSIS — M792 Neuralgia and neuritis, unspecified: Secondary | ICD-10-CM | POA: Diagnosis not present

## 2023-02-01 MED ORDER — EVOLOCUMAB 140 MG/ML ~~LOC~~ SOAJ
140.0000 mg | Freq: Once | SUBCUTANEOUS | Status: AC
  Administered 2023-02-01: 140 mg via SUBCUTANEOUS

## 2023-02-01 NOTE — Progress Notes (Signed)
Evolocumab SOAJ 140 mgDose: 140 mg  :  Subcutaneous  :   Once Order ID: [id number]  Ordered Admin Dose: 140 mg  Last Admin: Today 02/01/23 at 0915 (Given)    Given : 02/01/23 : 140 mg : Left Lower Abdomen

## 2023-02-02 DIAGNOSIS — N2581 Secondary hyperparathyroidism of renal origin: Secondary | ICD-10-CM | POA: Diagnosis not present

## 2023-02-02 DIAGNOSIS — T452X1A Poisoning by vitamins, accidental (unintentional), initial encounter: Secondary | ICD-10-CM | POA: Diagnosis not present

## 2023-02-02 DIAGNOSIS — E1122 Type 2 diabetes mellitus with diabetic chronic kidney disease: Secondary | ICD-10-CM | POA: Diagnosis not present

## 2023-02-02 DIAGNOSIS — I509 Heart failure, unspecified: Secondary | ICD-10-CM | POA: Diagnosis not present

## 2023-02-02 DIAGNOSIS — I130 Hypertensive heart and chronic kidney disease with heart failure and stage 1 through stage 4 chronic kidney disease, or unspecified chronic kidney disease: Secondary | ICD-10-CM | POA: Diagnosis not present

## 2023-02-02 DIAGNOSIS — N1831 Chronic kidney disease, stage 3a: Secondary | ICD-10-CM | POA: Diagnosis not present

## 2023-02-02 DIAGNOSIS — D631 Anemia in chronic kidney disease: Secondary | ICD-10-CM | POA: Diagnosis not present

## 2023-02-10 DIAGNOSIS — E1165 Type 2 diabetes mellitus with hyperglycemia: Secondary | ICD-10-CM | POA: Diagnosis not present

## 2023-02-10 DIAGNOSIS — I10 Essential (primary) hypertension: Secondary | ICD-10-CM | POA: Diagnosis not present

## 2023-02-10 DIAGNOSIS — I5032 Chronic diastolic (congestive) heart failure: Secondary | ICD-10-CM | POA: Diagnosis not present

## 2023-02-10 DIAGNOSIS — E7800 Pure hypercholesterolemia, unspecified: Secondary | ICD-10-CM | POA: Diagnosis not present

## 2023-02-10 DIAGNOSIS — E6609 Other obesity due to excess calories: Secondary | ICD-10-CM | POA: Diagnosis not present

## 2023-02-10 DIAGNOSIS — J4520 Mild intermittent asthma, uncomplicated: Secondary | ICD-10-CM | POA: Diagnosis not present

## 2023-02-10 DIAGNOSIS — N1831 Chronic kidney disease, stage 3a: Secondary | ICD-10-CM | POA: Diagnosis not present

## 2023-02-15 ENCOUNTER — Ambulatory Visit: Payer: Medicare HMO | Admitting: Cardiology

## 2023-02-15 DIAGNOSIS — E782 Mixed hyperlipidemia: Secondary | ICD-10-CM

## 2023-02-15 MED ORDER — EVOLOCUMAB 140 MG/ML ~~LOC~~ SOAJ
140.0000 mg | Freq: Once | SUBCUTANEOUS | Status: AC
  Administered 2023-02-15: 140 mg via SUBCUTANEOUS

## 2023-02-15 NOTE — Progress Notes (Signed)
Administrations This Visit     Evolocumab SOAJ 140 mg     Admin Date 02/15/2023 Action Given Dose 140 mg Route Subcutaneous Administered By Matthew Saras, LPN

## 2023-03-01 ENCOUNTER — Ambulatory Visit: Payer: Medicare HMO | Admitting: Cardiology

## 2023-03-01 DIAGNOSIS — H2513 Age-related nuclear cataract, bilateral: Secondary | ICD-10-CM | POA: Diagnosis not present

## 2023-03-01 DIAGNOSIS — H35033 Hypertensive retinopathy, bilateral: Secondary | ICD-10-CM | POA: Diagnosis not present

## 2023-03-01 DIAGNOSIS — H18603 Keratoconus, unspecified, bilateral: Secondary | ICD-10-CM | POA: Diagnosis not present

## 2023-03-01 DIAGNOSIS — E119 Type 2 diabetes mellitus without complications: Secondary | ICD-10-CM | POA: Diagnosis not present

## 2023-03-01 DIAGNOSIS — E782 Mixed hyperlipidemia: Secondary | ICD-10-CM | POA: Diagnosis not present

## 2023-03-01 DIAGNOSIS — H1045 Other chronic allergic conjunctivitis: Secondary | ICD-10-CM | POA: Diagnosis not present

## 2023-03-01 MED ORDER — EVOLOCUMAB 140 MG/ML ~~LOC~~ SOAJ
140.0000 mg | Freq: Once | SUBCUTANEOUS | Status: AC
  Administered 2023-03-01: 140 mg via SUBCUTANEOUS

## 2023-03-01 NOTE — Progress Notes (Signed)
Evolocumab SOAJ 140 mgDose: 140 mg  :  Subcutaneous  :   Once Order ID: [id number]  Ordered Admin Dose: 140 mg  Last Admin: Today 03/01/23 at 0840 (Given)    Given : 03/01/23 : 140 mg : Left Lower Abdomen

## 2023-03-03 DIAGNOSIS — Z1231 Encounter for screening mammogram for malignant neoplasm of breast: Secondary | ICD-10-CM | POA: Diagnosis not present

## 2023-03-15 ENCOUNTER — Ambulatory Visit: Payer: Medicare HMO | Admitting: Cardiology

## 2023-03-15 DIAGNOSIS — E782 Mixed hyperlipidemia: Secondary | ICD-10-CM

## 2023-03-15 MED ORDER — EVOLOCUMAB 140 MG/ML ~~LOC~~ SOAJ
140.0000 mg | Freq: Once | SUBCUTANEOUS | Status: AC
  Administered 2023-03-15: 140 mg via SUBCUTANEOUS

## 2023-03-15 NOTE — Progress Notes (Signed)
Evolocumab SOAJ 140 mgDose: 140 mg  :  Subcutaneous  :   Once Order ID: [id number]  Ordered Admin Dose: 140 mg  Last Admin: Today 03/15/23 at 0838 (Given)    Given : 03/15/23 : 140 mg : Right Lower Abdomen

## 2023-03-17 DIAGNOSIS — N1831 Chronic kidney disease, stage 3a: Secondary | ICD-10-CM | POA: Diagnosis not present

## 2023-03-17 DIAGNOSIS — E1165 Type 2 diabetes mellitus with hyperglycemia: Secondary | ICD-10-CM | POA: Diagnosis not present

## 2023-03-17 DIAGNOSIS — I119 Hypertensive heart disease without heart failure: Secondary | ICD-10-CM | POA: Diagnosis not present

## 2023-03-17 DIAGNOSIS — E785 Hyperlipidemia, unspecified: Secondary | ICD-10-CM | POA: Diagnosis not present

## 2023-03-22 DIAGNOSIS — I509 Heart failure, unspecified: Secondary | ICD-10-CM | POA: Diagnosis not present

## 2023-03-22 DIAGNOSIS — I130 Hypertensive heart and chronic kidney disease with heart failure and stage 1 through stage 4 chronic kidney disease, or unspecified chronic kidney disease: Secondary | ICD-10-CM | POA: Diagnosis not present

## 2023-03-22 DIAGNOSIS — E1122 Type 2 diabetes mellitus with diabetic chronic kidney disease: Secondary | ICD-10-CM | POA: Diagnosis not present

## 2023-03-22 DIAGNOSIS — N2581 Secondary hyperparathyroidism of renal origin: Secondary | ICD-10-CM | POA: Diagnosis not present

## 2023-03-22 DIAGNOSIS — D631 Anemia in chronic kidney disease: Secondary | ICD-10-CM | POA: Diagnosis not present

## 2023-03-22 DIAGNOSIS — N1831 Chronic kidney disease, stage 3a: Secondary | ICD-10-CM | POA: Diagnosis not present

## 2023-03-29 ENCOUNTER — Other Ambulatory Visit: Payer: Self-pay | Admitting: Nephrology

## 2023-03-29 DIAGNOSIS — N1831 Chronic kidney disease, stage 3a: Secondary | ICD-10-CM

## 2023-04-05 ENCOUNTER — Ambulatory Visit: Payer: Medicare HMO | Admitting: Cardiology

## 2023-04-05 DIAGNOSIS — E782 Mixed hyperlipidemia: Secondary | ICD-10-CM

## 2023-04-05 MED ORDER — EVOLOCUMAB 140 MG/ML ~~LOC~~ SOAJ
140.0000 mg | Freq: Once | SUBCUTANEOUS | Status: AC
Start: 2023-04-05 — End: 2023-04-05
  Administered 2023-04-05: 140 mg via SUBCUTANEOUS

## 2023-04-05 NOTE — Progress Notes (Signed)
Administrations This Visit     Evolocumab SOAJ 140 mg     Admin Date 04/05/2023 Action Given Dose 140 mg Route Subcutaneous Administered By Eustaquio Boyden

## 2023-04-07 DIAGNOSIS — N1831 Chronic kidney disease, stage 3a: Secondary | ICD-10-CM | POA: Diagnosis not present

## 2023-04-17 NOTE — Progress Notes (Unsigned)
Name: [name]  MRN/ DOB: [id number], 1961/03/31   Age/ Sex: 62 y.o., female    PCP: Jackie Plum, MD   Reason for Endocrinology Evaluation: Type 2 Diabetes Mellitus     Date of Initial Endocrinology Visit: 03/07/2022    PATIENT IDENTIFIER: [name] is a 62 y.o. female with a past medical history of T2DM, CKD, CHF, HTN , CAD and dyslipidemia . The patient presented for initial endocrinology clinic visit on 03/07/2022 for consultative assistance with her diabetes management.    HPI: [name] was    Diagnosed with DM ~ 9 yrs  Hemoglobin A1c has ranged from 11.8% in 12/2020, peaking at 12.2% in 06/2021.   Follows with [facility]   On her initial visit her A1c was 11.4%, she was already on metformin, and glipizide, she declined insulin.  We started her on Farxiga SUBJECTIVE:   During the last visit (11/28/2022): A1c 10.3%       Today (04/17/23): [name] is here for follow-up on diabetes management.she checks her blood sugars occasionally. The patient has not had hypoglycemic episodes since the last clinic visit.  She continues to follow-up with cardiology last visit was 04/05/2023 for mixed hyperlipidemia She was also seen by [facility] cardiovascular for abnormal ABI 11/16/2022, controlled on medical therapy  She was seen by nephrology 02/02/2023-Dr. Ronalee Belts  She eats bread in the form of sandwiches  She has stopped drinking juices   Continues with intermittent UTI's   HOME DIABETES REGIMEN:  Glipizide 5 mg, 2 tabs BID  Jardiance 25 mg daily     Statin: yes ACE-I/ARB: yes    METER DOWNLOAD SUMMARY: unable to download   218-314 mg/dL    DIABETIC COMPLICATIONS: Microvascular complications:  CKD III , neuropathy  Denies: retinopathy  Last eye exam: Completed 02/2022  Macrovascular complications:  CHF, CVA , PAD   PAST HISTORY: Past Medical History:  Past Medical History:  Diagnosis Date   Asthma    prn - occasional  uses inhaler   Chronic systolic CHF (congestive heart failure) (HCC)    Diabetes mellitus without complication (HCC)    borderline, being monitored, no meds   Hypercholesterolemia    Hypertension    Microcytic anemia    Nonischemic cardiomyopathy (HCC)    a. 2005 Cath: nonobstructive dzs;  b. 04/2013 Echo: EF 3035%, diff HK, restrictive physiology, mildly dil LA, PASP .   SVD (spontaneous vaginal delivery)    x 3   Past Surgical History:  Past Surgical History:  Procedure Laterality Date   BILATERAL SALPINGECTOMY Bilateral 01/15/2015   Procedure: BILATERAL SALPINGECTOMY;  Surgeon: Serita Kyle, MD;  Location: [facility];  Service: Gynecology;  Laterality: Bilateral;   CORONARY ANGIOPLASTY  2005   LEFT AND RIGHT HEART CATHETERIZATION WITH CORONARY ANGIOGRAM N/A 07/05/2013   Procedure: LEFT AND RIGHT HEART CATHETERIZATION WITH CORONARY ANGIOGRAM;  Surgeon: Laurey Morale, MD;  Location: [facility] CATH LAB;  Service: Cardiovascular;  Laterality: N/A;   RIGHT HEART CATHETERIZATION N/A 06/13/2013   Procedure: RIGHT HEART CATH;  Surgeon: Wendall Stade, MD;  Location: [facility] CATH LAB;  Service: Cardiovascular;  Laterality: N/A;   ROBOTIC ASSISTED TOTAL HYSTERECTOMY N/A 01/15/2015   Procedure: ROBOTIC ASSISTED TOTAL HYSTERECTOMY;  Surgeon: Serita Kyle, MD;  Location: [facility];  Service: Gynecology;  Laterality: N/A;   TUBAL LIGATION     WISDOM TOOTH EXTRACTION      Social History:  reports that she has never smoked. She has never used smokeless tobacco. She  reports that she does not drink alcohol and does not use drugs. Family History:  Family History  Problem Relation Age of Onset   Hypertension Sister    Hyperlipidemia Sister    Heart disease Brother        History of heart transplant   Hypertension Brother    Hypertension Sister    Hyperlipidemia Sister    Heart disease Brother        Coronary artery disease   Hypertension Brother    Heart disease Brother    Hypertension  Brother    Heart disease Brother    Hypertension Brother      HOME MEDICATIONS: Allergies as of 04/18/2023   No Known Allergies      Medication List        Accurate as of April 17, 2023  4:24 PM. If you have any questions, ask your nurse or doctor.          Accu-Chek Aviva Plus test strip Generic drug: glucose blood 1 each 3 (three) times daily.   Accu-Chek Softclix Lancets lancets 1 each 3 (three) times daily.   acetaminophen 325 MG tablet Commonly known as: TYLENOL Take 325-650 mg by mouth every 6 (six) hours as needed for mild pain (depends on pain).   albuterol 108 (90 Base) MCG/ACT inhaler Commonly known as: VENTOLIN HFA Inhale 2 puffs into the lungs every 4 (four) hours as needed for wheezing or shortness of breath.   aspirin EC 81 MG tablet Take 1 tablet (81 mg total) by mouth daily. Swallow whole.   carvedilol 12.5 MG tablet Commonly known as: COREG TAKE 1 TABLET BY MOUTH 2 TIMES DAILY WITH A MEAL. What changed:  how much to take how to take this when to take this additional instructions   cilostazol 50 MG tablet Commonly known as: PLETAL Take 1 tablet (50 mg total) by mouth 2 (two) times daily.   ezetimibe 10 MG tablet Commonly known as: ZETIA Take 10 mg by mouth daily.   ferrous sulfate 325 (65 FE) MG tablet Take 325 mg by mouth daily with breakfast.   furosemide 40 MG tablet Commonly known as: LASIX Take 1 tablet (40 mg total) by mouth daily. What changed:  how much to take when to take this   glipiZIDE 5 MG tablet Commonly known as: GLUCOTROL Take 2 tablets (10 mg total) by mouth 2 (two) times daily before a meal.   lisinopril 10 MG tablet Commonly known as: ZESTRIL Take 10 mg by mouth daily.   omega-3 acid ethyl esters 1 g capsule Commonly known as: LOVAZA Take 2 capsules by mouth 2 (two) times daily.   Repatha SureClick 140 MG/ML Soaj Generic drug: Evolocumab INJECT 1 ML INTO THE SKIN ONCE EVERY 14 DAYS   rosuvastatin 40  MG tablet Commonly known as: CRESTOR Take 1 tablet (40 mg total) by mouth at bedtime.   spironolactone 25 MG tablet Commonly known as: ALDACTONE Take 25 mg by mouth daily.   VITAMIN D PO Take 125 mcg by mouth in the morning and at bedtime.         ALLERGIES: No Known Allergies       OBJECTIVE:   VITAL SIGNS: LMP 01/01/2015 (Approximate)    PHYSICAL EXAM:  General: Pt appears well and is in NAD  Neck: General: Supple without adenopathy or carotid bruits. Thyroid: Thyroid size normal.  No goiter or nodules appreciated.   Lungs: Clear with good BS bilat with no rales, rhonchi, or wheezes  Heart: RRR   Extremities:  Lower extremities - No pretibial edema. No lesions.  Neuro: MS is good with appropriate affect, pt is alert and Ox3    DM foot exam: 03/07/2022  The skin of the feet is intact without sores or ulcerations. The pedal pulses are 2+ on right and 2+ on left. The sensation is intact to a screening 5.07, 10 gram monofilament bilaterally    DATA REVIEWED:  Lab Results  Component Value Date   HGBA1C 10.3 (A) 11/28/2022   HGBA1C 7.4 (A) 07/11/2022   HGBA1C 11.4 (A) 03/07/2022   Lab Results  Component Value Date   CHOL 88 (L) 10/27/2021   HDL 46 10/27/2021   LDLCALC 20 10/27/2021   TRIG 121 10/27/2021   CHOLHDL 1.9 10/27/2021        Latest Reference Range & Units 03/07/22 08:59  Sodium 135 - 145 mEq/L 138  Potassium 3.5 - 5.1 mEq/L 4.4  Chloride 96 - 112 mEq/L 99  CO2 19 - 32 mEq/L 29  Glucose 70 - 99 mg/dL 161 (H)  BUN 6 - 23 mg/dL 12  Creatinine 0.96 - 0.45 mg/dL 4.09 (H)  Calcium 8.4 - 10.5 mg/dL 81.1  GFR >91.47 mL/min 46.19 (L)     ASSESSMENT / PLAN / RECOMMENDATIONS:   1) Type 2 Diabetes Mellitus, Poorly Controlled , With neuropathic CKD III and macrovascular  complications - Most recent A1c of 10.3 %. Goal A1c < 7.0 %.    A1c increased from 7.4% to 10.3% , this is due to dietary indiscretions  We also emphasized the importance of  glucose checks at home , she is not checking as often possibly once every 2 weeks. Pt to check 2-3x a week  BMP shows stable GFR is worsening  Will increase Glipizide as below  Per insurance will switch [place] to [place] and increase    MEDICATIONS: Stop  Metformin 1000 mg Twice a day Increase  Glipizide 5 mg, 2 tablet before Breakfast and 2 tablet before Supper  Stop  Farxiga/Jardiance 25 mg daily (per insurance)     EDUCATION / INSTRUCTIONS: BG monitoring instructions: Patient is instructed to check her blood sugars 1 times a day, fasting . Call [facility] if: BG persistently < 70  I reviewed the Rule of 15 for the treatment of hypoglycemia in detail with the patient. Literature supplied.   2) Diabetic complications:  Eye: Does not have known diabetic retinopathy.  Neuro/ Feet: Does  have known diabetic peripheral neuropathy. Renal: Patient does  have known baseline CKD. She is  on an ACEI/ARB at present.Follows with [facility]    3) Acute on chronic kidney disease:   -Hyperglycemia is a huge contributing factor -GFR decreased from 43 to 15 on today's labs -Will stop metformin and Farxiga/Jardiance but continue with glipizide -Referral has been placed to [facility] as she is an established patient there   Follow-up in 4 months    Signed electronically by: Lyndle Herrlich, MD  [facility]  [facility] [address] [place], [place] [address] Phone: [phone] FAX: [phone]   CC: Jackie Plum, MD [address] [place] [address] Phone: [phone]  Fax: [phone]    Return to Endocrinology clinic as below: Future Appointments  Date Time Provider Department Center  04/18/2023 11:50 AM Hailly Fess, Konrad Dolores, MD [facility] None  04/19/2023  9:15 AM PCV-NURSE [facility] None  04/19/2023 11:30 AM [facility] [place] 3 [id number] GI-315 W.  WE  05/17/2023  8:30  AM Patwardhan, Anabel Bene, MD [facility] None

## 2023-04-18 ENCOUNTER — Ambulatory Visit (INDEPENDENT_AMBULATORY_CARE_PROVIDER_SITE_OTHER): Payer: Medicare HMO | Admitting: Internal Medicine

## 2023-04-18 ENCOUNTER — Encounter: Payer: Self-pay | Admitting: Internal Medicine

## 2023-04-18 VITALS — BP 112/70 | HR 70 | Ht 61.0 in | Wt 179.0 lb

## 2023-04-18 DIAGNOSIS — E1122 Type 2 diabetes mellitus with diabetic chronic kidney disease: Secondary | ICD-10-CM | POA: Diagnosis not present

## 2023-04-18 DIAGNOSIS — Z794 Long term (current) use of insulin: Secondary | ICD-10-CM

## 2023-04-18 DIAGNOSIS — N184 Chronic kidney disease, stage 4 (severe): Secondary | ICD-10-CM

## 2023-04-18 DIAGNOSIS — E1142 Type 2 diabetes mellitus with diabetic polyneuropathy: Secondary | ICD-10-CM

## 2023-04-18 DIAGNOSIS — E1165 Type 2 diabetes mellitus with hyperglycemia: Secondary | ICD-10-CM | POA: Diagnosis not present

## 2023-04-18 LAB — POCT GLYCOSYLATED HEMOGLOBIN (HGB A1C): Hemoglobin A1C: 7.2 % — AB (ref 4.0–5.6)

## 2023-04-18 MED ORDER — TRESIBA FLEXTOUCH 100 UNIT/ML ~~LOC~~ SOPN: 14.0000 [IU] | PEN_INJECTOR | Freq: Every day | SUBCUTANEOUS | 3 refills | Status: DC

## 2023-04-18 MED ORDER — INSULIN PEN NEEDLE 32G X 4 MM MISC: 1.0000 | Freq: Every day | 3 refills | Status: DC

## 2023-04-18 MED ORDER — GLIPIZIDE 5 MG PO TABS: 5.0000 mg | ORAL_TABLET | Freq: Two times a day (BID) | ORAL | 3 refills | Status: DC

## 2023-04-18 MED ORDER — SEMAGLUTIDE(0.25 OR 0.5MG/DOS) 2 MG/3ML ~~LOC~~ SOPN: 0.5000 mg | PEN_INJECTOR | SUBCUTANEOUS | 3 refills | Status: DC

## 2023-04-18 NOTE — Patient Instructions (Signed)
Start Ozempic 0.25 mg once weekly for 6 weeks, than increase to 0.5 mg weekly  Decrease Glipizide 5 mg , 1 tablet before Breakfast and 1 tablet before Supper Continue Tresiba 14 units daily      HOW TO TREAT LOW BLOOD SUGARS (Blood sugar LESS THAN 70 MG/DL) Please follow the RULE OF 15 for the treatment of hypoglycemia treatment (when your (blood sugars are less than 70 mg/dL)   STEP 1: Take 15 grams of carbohydrates when your blood sugar is low, which includes:  3-4 GLUCOSE TABS  OR 3-4 OZ OF JUICE OR REGULAR SODA OR ONE TUBE OF GLUCOSE GEL    STEP 2: RECHECK blood sugar in 15 MINUTES STEP 3: If your blood sugar is still low at the 15 minute recheck --> then, go back to STEP 1 and treat AGAIN with another 15 grams of carbohydrates.

## 2023-04-19 ENCOUNTER — Ambulatory Visit
Admission: RE | Admit: 2023-04-19 | Discharge: 2023-04-19 | Disposition: A | Discharge status: Home / Self Care | Payer: Medicare HMO | Source: Ambulatory Visit | Attending: Nephrology | Admitting: Nephrology

## 2023-04-19 ENCOUNTER — Ambulatory Visit: Payer: Medicare HMO | Admitting: Cardiology

## 2023-04-19 DIAGNOSIS — N189 Chronic kidney disease, unspecified: Secondary | ICD-10-CM | POA: Diagnosis not present

## 2023-04-19 DIAGNOSIS — E782 Mixed hyperlipidemia: Secondary | ICD-10-CM

## 2023-04-19 DIAGNOSIS — N1831 Chronic kidney disease, stage 3a: Secondary | ICD-10-CM

## 2023-04-19 MED ORDER — EVOLOCUMAB 140 MG/ML ~~LOC~~ SOAJ
140.0000 mg | Freq: Once | SUBCUTANEOUS | Status: AC
Start: 2023-04-19 — End: 2023-04-19
  Administered 2023-04-19: 140 mg via SUBCUTANEOUS

## 2023-04-19 NOTE — Progress Notes (Signed)
Administrations This Visit     Evolocumab SOAJ 140 mg     Admin Date 04/19/2023 Action Given Dose 140 mg Route Subcutaneous Administered By Eustaquio Boyden

## 2023-05-03 ENCOUNTER — Ambulatory Visit: Payer: Medicare HMO | Admitting: Cardiology

## 2023-05-03 DIAGNOSIS — E782 Mixed hyperlipidemia: Secondary | ICD-10-CM

## 2023-05-03 MED ORDER — EVOLOCUMAB 140 MG/ML ~~LOC~~ SOAJ
140.0000 mg | Freq: Once | SUBCUTANEOUS | Status: AC
Start: 2023-05-03 — End: 2023-05-03
  Administered 2023-05-03: 140 mg via SUBCUTANEOUS

## 2023-05-03 NOTE — Progress Notes (Signed)
Administrations This Visit     Evolocumab SOAJ 140 mg     Admin Date 05/03/2023 Action Given Dose 140 mg Route Subcutaneous Administered By Lind Covert, LPN

## 2023-05-09 ENCOUNTER — Ambulatory Visit: Payer: Medicare HMO | Admitting: Cardiology

## 2023-05-09 ENCOUNTER — Encounter: Payer: Self-pay | Admitting: Cardiology

## 2023-05-09 VITALS — BP 98/63 | HR 74 | Resp 16 | Ht 61.0 in | Wt 174.0 lb

## 2023-05-09 DIAGNOSIS — I2510 Atherosclerotic heart disease of native coronary artery without angina pectoris: Secondary | ICD-10-CM | POA: Diagnosis not present

## 2023-05-09 DIAGNOSIS — I10 Essential (primary) hypertension: Secondary | ICD-10-CM

## 2023-05-09 DIAGNOSIS — E782 Mixed hyperlipidemia: Secondary | ICD-10-CM | POA: Diagnosis not present

## 2023-05-09 DIAGNOSIS — I739 Peripheral vascular disease, unspecified: Secondary | ICD-10-CM | POA: Diagnosis not present

## 2023-05-09 NOTE — Progress Notes (Signed)
Patient referred by Jackie Plum, MD for abnormal ABI  Subjective:   [facility], female    DOB: 1961-04-17, 62 y.o.   MRN: [id number]   Chief Complaint  Patient presents with   Coronary Artery Disease   Hyperlipidemia   PAD   Follow-up    6 month    HPI  62 y/o African-American female with hypertension, type 2 diabetes mellitus, hyperlipidemia, coronary artery disease, PAD, nonischemic cardiomyopathy with now recovered EF, pulmonary hypertension  In the last few months, her renal function had significantly reduced. Details below.  Patient has been that she was evaluated by nephrologist.  Metformin, Farxiga, spironolactone were stopped.  Reportedly, subsequent renal function has shown improvement.  Blood pressure has been running low, associated with dizziness.  Carvedilol dose was reduced from 12.5 mg twice daily to 3.125 mg twice daily. .  Current Outpatient Medications:    ACCU-CHEK AVIVA PLUS test strip, 1 each 3 (three) times daily., Disp: , Rfl:    Accu-Chek Softclix Lancets lancets, 1 each 3 (three) times daily., Disp: , Rfl:    acetaminophen (TYLENOL) 325 MG tablet, Take 325-650 mg by mouth every 6 (six) hours as needed for mild pain (depends on pain)., Disp: , Rfl:    albuterol (PROVENTIL HFA;VENTOLIN HFA) 108 (90 BASE) MCG/ACT inhaler, Inhale 2 puffs into the lungs every 4 (four) hours as needed for wheezing or shortness of breath., Disp: , Rfl:    aspirin EC 81 MG tablet, Take 1 tablet (81 mg total) by mouth daily. Swallow whole., Disp: 90 tablet, Rfl: 3   carvedilol (COREG) 12.5 MG tablet, TAKE 1 TABLET BY MOUTH 2 TIMES DAILY WITH A MEAL. (Patient taking differently: Take 12.5 mg by mouth 2 (two) times daily with a meal.), Disp: 60 tablet, Rfl: 11   Cholecalciferol (VITAMIN D PO), Take 125 mcg by mouth in the morning and at bedtime. , Disp: , Rfl:    cilostazol (PLETAL) 50 MG tablet, Take 1 tablet (50 mg total) by mouth 2 (two) times daily., Disp: 60 tablet,  Rfl: 2   Evolocumab (REPATHA SURECLICK) 140 MG/ML SOAJ, INJECT 1 ML INTO THE SKIN ONCE EVERY 14 DAYS, Disp: 6 mL, Rfl: 3   ezetimibe (ZETIA) 10 MG tablet, Take 10 mg by mouth daily., Disp: , Rfl:    ferrous sulfate 325 (65 FE) MG tablet, Take 325 mg by mouth daily with breakfast., Disp: , Rfl:    furosemide (LASIX) 40 MG tablet, Take 1 tablet (40 mg total) by mouth daily. (Patient taking differently: Take 20 mg by mouth 2 (two) times daily.), Disp: 30 tablet, Rfl: 11   glipiZIDE (GLUCOTROL) 5 MG tablet, Take 1 tablet (5 mg total) by mouth 2 (two) times daily before a meal., Disp: 180 tablet, Rfl: 3   insulin degludec (TRESIBA FLEXTOUCH) 100 UNIT/ML FlexTouch Pen, Inject 14 Units into the skin daily., Disp: 15 mL, Rfl: 3   Insulin Pen Needle 32G X 4 MM MISC, 1 Device by Does not apply route daily in the afternoon., Disp: 100 each, Rfl: 3   lisinopril (ZESTRIL) 10 MG tablet, Take 10 mg by mouth daily. , Disp: , Rfl:    omega-3 acid ethyl esters (LOVAZA) 1 g capsule, Take 2 capsules by mouth 2 (two) times daily., Disp: , Rfl:    rosuvastatin (CRESTOR) 40 MG tablet, Take 1 tablet (40 mg total) by mouth at bedtime., Disp: 30 tablet, Rfl: 11   Semaglutide,0.25 or 0.5MG /DOS, 2 MG/3ML SOPN, Inject 0.5 mg into the  skin once a week., Disp: 9 mL, Rfl: 3   spironolactone (ALDACTONE) 25 MG tablet, Take 25 mg by mouth daily., Disp: , Rfl:   Cardiovascular and other pertinent studies:  EKG 11/16/2022: Sinus rhythm 57 bpm First degree A-V block  Low voltage in precordial leads Old inferior infarct Nonspecific T-abnormality  Echocardiogram 03/09/2020:  Normal LV systolic function with visual EF 55-60%. Left ventricle cavity  is normal in size. Moderate left ventricular hypertrophy. Normal global  wall motion. No obvious regional wall motion abnormalities. Doppler  evidence of grade I diastolic dysfunction, elevated LAP. Calculated EF  55%.  No significant valvular abnormalities.  IVC is normal with a  respiratory response of <50%.  No significant change compared to prior study dated 11/2015 except Grade I  diastolic dysfunction is new.   ABI 02/06/2020: Rt 0.68 Lt 0.88  LHC/RHC 06/2013: LM: No significant disease LAD: 50% mid LAD stenosis with distal tapering.  LCx: Moderate early OM1 with 50% ostial stenosis. Mid LCx with 40% stenosis.  RCA: Diffuse RCA disease with 70-80% ostial stenosis and damping of the waveform; serial 50% stenoses in the mid and distal RCA.  Reviewed with Dr. Clifton James.  70-80% stenosis with ostial damping in setting of relatively small caliber RCA.  The RCA is diffusely disease.  In the absence of ACS (no chest pain), would treat medically  Left ventriculography: Global hypokinesis with EF estimated at 35%.  Optimized PCWP on current dose of Lasix (16 mmHg).   Pulmonary arterial HTN with elevated PVR, not responsive to adenosine.  Given co-existing LV dysfunction, I am going to try her on sildenafil 20 mg tid.   She will followup in clinic in about 2 wks.   RHC 05/2013: Mean RA:  20  mmHg RV: 76/12 mmHg PA: 75/36 mmHg with a mean of 49 mmHg PCWP: 27 mmHg   Ao sat : 94% PA sat 50%    Fick Cardiac Output:  3.1 L/minute,  1.8 L/min minute INR INR ute/M2   PVR: 8.0 wood unites 567 dysnes-sec cm5   Impression:  PAD significantly higher than PCWP indicating some intrinsic pulmonary hypertension.  Increase diuretics and add nitrates Refer to CHF clinic and continue f/u with pulmonary ? [place]  Recent labs: 04/18/2023: HbA1C 7.2%  02/02/2023: Glucose 227, BUN/Cr 29/3.6. EGFR 15. K 4.2 Hb 10.3  03/07/2022: Glucose 332, BUN/Cr 12/1.25. EGFR 46. Na/K 138/4.4.  HbA1C 11.4%  10/27/2021: Chol 88, TG 121, HDL 46, LDL 20  Review of Systems  Cardiovascular: Positive for claudication. Negative for chest pain, dyspnea on exertion, leg swelling, palpitations and syncope.       Vitals:   05/09/23 1121  BP: 98/63  Pulse: 74  Resp: 16  SpO2: 98%     Body  mass index is 32.88 kg/m. Filed Weights   05/09/23 1121  Weight: 174 lb (78.9 kg)     Objective:    Physical Exam Vitals and nursing note reviewed.  Constitutional:      General: She is not in acute distress. Neck:     Vascular: No JVD.  Cardiovascular:     Rate and Rhythm: Normal rate and regular rhythm.     Pulses: Intact distal pulses.          Femoral pulses are 1+ on the right side and 1+ on the left side.      Dorsalis pedis pulses are 0 on the right side and 1+ on the left side.       Posterior tibial  pulses are 0 on the right side and 0 on the left side.     Heart sounds: Normal heart sounds. No murmur heard. Pulmonary:     Effort: Pulmonary effort is normal.     Breath sounds: Normal breath sounds. No wheezing or rales.  Musculoskeletal:     Right lower leg: No edema.     Left lower leg: No edema.            Assessment & Recommendations:   62 y/o African-American female with hypertension, type 2 diabetes mellitus, hyperlipidemia, coronary artery disease, PAD, CKD 5, nonischemic cardiomyopathy with now recovered EF, pulmonary hypertension  Hypertension: No longer a major issue.  In fact, her blood pressure is low normal today.  I will stop her carvedilol.  Will monitor clinically in 3 months to ensure no recurrence of reduced EF.  In future, lisinopril could also be reduced or stopped as per nephrology recommendations, in the setting of her advanced CKD.  PAD: No critical limb ischemia.  Abnormal ABI Rt 0.68, Lt 0.78 (01/2020). Rutherford class III claudication, controlled with medical therapy. Change plavix 75 mg daily to Aspirin 81 mg daily, continue Pletal 50 mg bid. Needs aggressive risk factor modification, especially diabetes and hyperlipidemia, see below.    Mixed hyperlipidemia: LDL down to 20 on Repatha and Crestor (10/2021).  Type 2 DM: A1c down to 7.2%.  Continue follow-up with PCP.  Coronary artery disease: No angina symptoms at this time.   Continue medical therapy.  F/u in 3 months    Elder Negus, MD Pager: [phone] Office: [phone]

## 2023-05-12 DIAGNOSIS — I5032 Chronic diastolic (congestive) heart failure: Secondary | ICD-10-CM | POA: Diagnosis not present

## 2023-05-12 DIAGNOSIS — J4520 Mild intermittent asthma, uncomplicated: Secondary | ICD-10-CM | POA: Diagnosis not present

## 2023-05-12 DIAGNOSIS — E6609 Other obesity due to excess calories: Secondary | ICD-10-CM | POA: Diagnosis not present

## 2023-05-12 DIAGNOSIS — N1831 Chronic kidney disease, stage 3a: Secondary | ICD-10-CM | POA: Diagnosis not present

## 2023-05-12 DIAGNOSIS — E1165 Type 2 diabetes mellitus with hyperglycemia: Secondary | ICD-10-CM | POA: Diagnosis not present

## 2023-05-12 DIAGNOSIS — E7800 Pure hypercholesterolemia, unspecified: Secondary | ICD-10-CM | POA: Diagnosis not present

## 2023-05-12 DIAGNOSIS — I10 Essential (primary) hypertension: Secondary | ICD-10-CM | POA: Diagnosis not present

## 2023-05-16 ENCOUNTER — Ambulatory Visit: Payer: Medicare HMO | Admitting: Cardiology

## 2023-05-16 ENCOUNTER — Ambulatory Visit: Payer: Medicare HMO

## 2023-05-17 ENCOUNTER — Ambulatory Visit: Payer: Medicare HMO | Admitting: Cardiology

## 2023-05-17 DIAGNOSIS — E782 Mixed hyperlipidemia: Secondary | ICD-10-CM | POA: Diagnosis not present

## 2023-05-17 MED ORDER — EVOLOCUMAB 140 MG/ML ~~LOC~~ SOAJ
140.0000 mg | Freq: Once | SUBCUTANEOUS | Status: AC
Start: 2023-05-17 — End: 2023-05-17
  Administered 2023-05-17: 140 mg via SUBCUTANEOUS

## 2023-05-17 NOTE — Progress Notes (Signed)
Administrations This Visit     Evolocumab SOAJ 140 mg     Admin Date 05/17/2023 Action Given Dose 140 mg Route Subcutaneous Administered By Eustaquio Boyden

## 2023-05-23 DIAGNOSIS — D631 Anemia in chronic kidney disease: Secondary | ICD-10-CM | POA: Diagnosis not present

## 2023-05-23 DIAGNOSIS — T452X1A Poisoning by vitamins, accidental (unintentional), initial encounter: Secondary | ICD-10-CM | POA: Diagnosis not present

## 2023-05-23 DIAGNOSIS — E1122 Type 2 diabetes mellitus with diabetic chronic kidney disease: Secondary | ICD-10-CM | POA: Diagnosis not present

## 2023-05-23 DIAGNOSIS — N2581 Secondary hyperparathyroidism of renal origin: Secondary | ICD-10-CM | POA: Diagnosis not present

## 2023-05-23 DIAGNOSIS — I509 Heart failure, unspecified: Secondary | ICD-10-CM | POA: Diagnosis not present

## 2023-05-23 DIAGNOSIS — N1831 Chronic kidney disease, stage 3a: Secondary | ICD-10-CM | POA: Diagnosis not present

## 2023-05-23 DIAGNOSIS — I130 Hypertensive heart and chronic kidney disease with heart failure and stage 1 through stage 4 chronic kidney disease, or unspecified chronic kidney disease: Secondary | ICD-10-CM | POA: Diagnosis not present

## 2023-05-30 DIAGNOSIS — H18603 Keratoconus, unspecified, bilateral: Secondary | ICD-10-CM | POA: Diagnosis not present

## 2023-05-30 DIAGNOSIS — H16223 Keratoconjunctivitis sicca, not specified as Sjogren's, bilateral: Secondary | ICD-10-CM | POA: Diagnosis not present

## 2023-05-30 DIAGNOSIS — E876 Hypokalemia: Secondary | ICD-10-CM | POA: Diagnosis not present

## 2023-05-31 ENCOUNTER — Ambulatory Visit: Payer: Medicare HMO

## 2023-06-01 ENCOUNTER — Ambulatory Visit: Payer: Medicare HMO | Admitting: Cardiology

## 2023-06-01 DIAGNOSIS — E782 Mixed hyperlipidemia: Secondary | ICD-10-CM | POA: Diagnosis not present

## 2023-06-01 MED ORDER — EVOLOCUMAB 140 MG/ML ~~LOC~~ SOSY
140.0000 mg | PREFILLED_SYRINGE | Freq: Once | SUBCUTANEOUS | Status: AC
Start: 2023-06-01 — End: 2023-06-01
  Administered 2023-06-01: 140 mg via SUBCUTANEOUS

## 2023-06-01 NOTE — Progress Notes (Signed)
Evolocumab SOSY 140 mgDose: 140 mg  :  Subcutaneous  :   Once Order ID: [id number]  Ordered Admin Dose: 140 mg  Last Admin: Today 06/01/23 at 0828 (Given)    Given : 06/01/23 : 140 mg : Right Lower Abdomen Due 06/01/23 at 08

## 2023-06-13 DIAGNOSIS — I10 Essential (primary) hypertension: Secondary | ICD-10-CM | POA: Diagnosis not present

## 2023-06-13 DIAGNOSIS — J4520 Mild intermittent asthma, uncomplicated: Secondary | ICD-10-CM | POA: Diagnosis not present

## 2023-06-13 DIAGNOSIS — E6609 Other obesity due to excess calories: Secondary | ICD-10-CM | POA: Diagnosis not present

## 2023-06-13 DIAGNOSIS — N1831 Chronic kidney disease, stage 3a: Secondary | ICD-10-CM | POA: Diagnosis not present

## 2023-06-13 DIAGNOSIS — E1165 Type 2 diabetes mellitus with hyperglycemia: Secondary | ICD-10-CM | POA: Diagnosis not present

## 2023-06-13 DIAGNOSIS — E7800 Pure hypercholesterolemia, unspecified: Secondary | ICD-10-CM | POA: Diagnosis not present

## 2023-06-13 DIAGNOSIS — I5032 Chronic diastolic (congestive) heart failure: Secondary | ICD-10-CM | POA: Diagnosis not present

## 2023-06-13 DIAGNOSIS — Z0001 Encounter for general adult medical examination with abnormal findings: Secondary | ICD-10-CM | POA: Diagnosis not present

## 2023-06-15 ENCOUNTER — Ambulatory Visit: Payer: Medicare HMO | Admitting: Cardiology

## 2023-06-15 DIAGNOSIS — E782 Mixed hyperlipidemia: Secondary | ICD-10-CM

## 2023-06-15 MED ORDER — EVOLOCUMAB 140 MG/ML ~~LOC~~ SOAJ
140.0000 mg | Freq: Once | SUBCUTANEOUS | Status: AC
Start: 2023-06-15 — End: 2023-06-15
  Administered 2023-06-15: 140 mg via SUBCUTANEOUS

## 2023-06-15 NOTE — Progress Notes (Signed)
Evolocumab SOAJ 140 mgDose: 140 mg  :  Subcutaneous  :   Once Order ID: [id number]  Ordered Admin Dose: 140 mg  Last Admin: Today 06/15/23 at 1142 (Given)    Given : 06/15/23 : 140 mg : Right Lower Abdomen

## 2023-06-27 DIAGNOSIS — I5032 Chronic diastolic (congestive) heart failure: Secondary | ICD-10-CM | POA: Diagnosis not present

## 2023-06-27 DIAGNOSIS — I110 Hypertensive heart disease with heart failure: Secondary | ICD-10-CM | POA: Diagnosis not present

## 2023-06-27 DIAGNOSIS — E6609 Other obesity due to excess calories: Secondary | ICD-10-CM | POA: Diagnosis not present

## 2023-06-27 DIAGNOSIS — E870 Hyperosmolality and hypernatremia: Secondary | ICD-10-CM | POA: Diagnosis not present

## 2023-06-27 DIAGNOSIS — Z794 Long term (current) use of insulin: Secondary | ICD-10-CM | POA: Diagnosis not present

## 2023-06-27 DIAGNOSIS — J4520 Mild intermittent asthma, uncomplicated: Secondary | ICD-10-CM | POA: Diagnosis not present

## 2023-06-27 DIAGNOSIS — E1122 Type 2 diabetes mellitus with diabetic chronic kidney disease: Secondary | ICD-10-CM | POA: Diagnosis not present

## 2023-06-27 DIAGNOSIS — N1831 Chronic kidney disease, stage 3a: Secondary | ICD-10-CM | POA: Diagnosis not present

## 2023-06-27 DIAGNOSIS — E7800 Pure hypercholesterolemia, unspecified: Secondary | ICD-10-CM | POA: Diagnosis not present

## 2023-06-29 ENCOUNTER — Ambulatory Visit: Payer: Medicare HMO | Admitting: Cardiology

## 2023-06-29 DIAGNOSIS — E782 Mixed hyperlipidemia: Secondary | ICD-10-CM | POA: Diagnosis not present

## 2023-06-29 MED ORDER — EVOLOCUMAB 140 MG/ML ~~LOC~~ SOAJ
140.0000 mg | Freq: Once | SUBCUTANEOUS | Status: AC
Start: 2023-06-29 — End: 2023-06-29
  Administered 2023-06-29: 140 mg via SUBCUTANEOUS

## 2023-06-29 NOTE — Progress Notes (Signed)
Evolocumab SOAJ 140 mgDose: 140 mg  :  Subcutaneous  :   Once Order ID: [id number]  Ordered Admin Dose: 140 mg  Last Admin: Today 06/29/23 at 0835 (Given)    Given : 06/29/23 : 140 mg : Left Lower Abdomen Due 06/29/23 at 0845

## 2023-07-12 ENCOUNTER — Encounter: Payer: Self-pay | Admitting: Internal Medicine

## 2023-07-13 ENCOUNTER — Ambulatory Visit: Payer: Medicare HMO | Admitting: Cardiology

## 2023-07-13 DIAGNOSIS — E782 Mixed hyperlipidemia: Secondary | ICD-10-CM

## 2023-07-13 MED ORDER — EVOLOCUMAB 140 MG/ML ~~LOC~~ SOAJ
140.0000 mg | Freq: Once | SUBCUTANEOUS | Status: AC
Start: 2023-07-13 — End: 2023-07-13
  Administered 2023-07-13: 140 mg via SUBCUTANEOUS

## 2023-07-16 NOTE — Progress Notes (Signed)
Administrations This Visit     Evolocumab SOAJ 140 mg     Admin Date 07/13/2023 Action Given Dose 140 mg Route Subcutaneous Documented By Eustaquio Boyden

## 2023-07-24 ENCOUNTER — Telehealth: Payer: Self-pay | Admitting: *Deleted

## 2023-07-24 ENCOUNTER — Telehealth: Payer: Self-pay

## 2023-07-24 NOTE — Telephone Encounter (Signed)
Hello Dr Leonides Schanz- This patient is on PLETAL.  I'm not sure there was ever a final consensus regarding PLETAL holds.  Please advise your preference and if a hold is needed, please forward this to your CMA to obtain one. Thanks !  [name]

## 2023-07-24 NOTE — Telephone Encounter (Signed)
[facility] Pre-operative Risk Assessment     Request for surgical clearance:     Endoscopy Procedure  What type of surgery is being performed?     Colonoscopy  When is this surgery scheduled?     08/24/23  What type of clearance is required ?   Pharmacy  Are there any medications that need to be held prior to surgery and how long? Pletal 2 day hold  Practice name and name of physician performing surgery?      [facility]  What is your office phone and fax number?      Phone- [phone]  Fax- [phone]  Anesthesia type (None, local, MAC, general) ?       MAC

## 2023-07-25 ENCOUNTER — Telehealth: Payer: Self-pay

## 2023-07-25 ENCOUNTER — Encounter: Payer: Self-pay | Admitting: Cardiology

## 2023-07-25 DIAGNOSIS — I509 Heart failure, unspecified: Secondary | ICD-10-CM | POA: Diagnosis not present

## 2023-07-25 DIAGNOSIS — E876 Hypokalemia: Secondary | ICD-10-CM | POA: Diagnosis not present

## 2023-07-25 DIAGNOSIS — D631 Anemia in chronic kidney disease: Secondary | ICD-10-CM | POA: Diagnosis not present

## 2023-07-25 DIAGNOSIS — E1122 Type 2 diabetes mellitus with diabetic chronic kidney disease: Secondary | ICD-10-CM | POA: Diagnosis not present

## 2023-07-25 DIAGNOSIS — N2581 Secondary hyperparathyroidism of renal origin: Secondary | ICD-10-CM | POA: Diagnosis not present

## 2023-07-25 DIAGNOSIS — I130 Hypertensive heart and chronic kidney disease with heart failure and stage 1 through stage 4 chronic kidney disease, or unspecified chronic kidney disease: Secondary | ICD-10-CM | POA: Diagnosis not present

## 2023-07-25 DIAGNOSIS — N1831 Chronic kidney disease, stage 3a: Secondary | ICD-10-CM | POA: Diagnosis not present

## 2023-07-25 DIAGNOSIS — T452X1A Poisoning by vitamins, accidental (unintentional), initial encounter: Secondary | ICD-10-CM | POA: Diagnosis not present

## 2023-07-25 NOTE — Telephone Encounter (Signed)
Pre-op team,   Patient is followed by [facility]. Preoperative evaluation and recommendations for holding antiplatelets will need to come from their office.   Thank you!  DW

## 2023-07-25 NOTE — Telephone Encounter (Signed)
   [name] 01-08-1961 [id number]  Dear DrPatwardhan,Manish  We have scheduled the above named patient for a(n) Colonoscopy procedure. Our records show that (s)he is on anticoagulation therapy.  Please advise as to whether the patient may come off their therapy of Pletal  2 days prior to their procedure which is scheduled for 08/24/23.  Please route your response to [facility] or fax response to [phone].  Sincerely,     Gastroenterology

## 2023-07-25 NOTE — Telephone Encounter (Signed)
See notes, please review pts chart

## 2023-07-25 NOTE — Telephone Encounter (Signed)
Letter on chart. Please fax to [facility] at [place] number above.

## 2023-07-27 ENCOUNTER — Ambulatory Visit: Payer: Medicare HMO | Admitting: Cardiology

## 2023-07-27 DIAGNOSIS — E782 Mixed hyperlipidemia: Secondary | ICD-10-CM

## 2023-07-27 MED ORDER — EVOLOCUMAB 140 MG/ML ~~LOC~~ SOAJ
140.0000 mg | Freq: Once | SUBCUTANEOUS | Status: AC
Start: 2023-07-27 — End: 2023-07-27
  Administered 2023-07-27: 140 mg via SUBCUTANEOUS

## 2023-07-27 NOTE — Progress Notes (Signed)
Evolocumab SOAJ 140 mgDose: 140 mg  :  Subcutaneous  :   Once Order ID: [id number]  Ordered Admin Dose: 140 mg  Last Admin: Today 07/27/23 at 0825 (Given)    Given : 07/27/23 : 140 mg : Left Lower Abdomen

## 2023-07-28 ENCOUNTER — Telehealth: Payer: Self-pay

## 2023-07-28 NOTE — Telephone Encounter (Signed)
Called patient advised we received fax from Dr Rosemary Holms okay to hold Aspirin and Pletal 2 day's prior to procedure. Patient verbalized understanding.

## 2023-07-28 NOTE — Telephone Encounter (Deleted)
Received fax from Dr Rosemary Holms advised 2 dya

## 2023-08-01 ENCOUNTER — Ambulatory Visit (AMBULATORY_SURGERY_CENTER): Payer: Medicare HMO

## 2023-08-01 VITALS — Ht 61.0 in | Wt 172.0 lb

## 2023-08-01 DIAGNOSIS — Z1211 Encounter for screening for malignant neoplasm of colon: Secondary | ICD-10-CM

## 2023-08-01 MED ORDER — NA SULFATE-K SULFATE-MG SULF 17.5-3.13-1.6 GM/177ML PO SOLN: 1.0000 | Freq: Once | ORAL | 0 refills | Status: AC

## 2023-08-01 NOTE — Progress Notes (Signed)
No egg or soy allergy known to patient  No issues known to pt with past sedation with any surgeries or procedures Patient denies ever being told they had issues or difficulty with intubation  No FH of Malignant Hyperthermia Pt is not on diet pills Pt is not on  home 02  Pt is on blood thinners  Pt denies issues with constipation  No A fib or A flutter Have any cardiac testing pending--no Ambulates independently Pt instructed to use [email or web address] or GoodRx for a price reduction on prep

## 2023-08-09 ENCOUNTER — Ambulatory Visit: Payer: Medicare HMO | Admitting: Cardiology

## 2023-08-09 ENCOUNTER — Encounter: Payer: Self-pay | Admitting: Cardiology

## 2023-08-09 VITALS — BP 130/81 | HR 84 | Resp 16 | Ht 61.0 in | Wt 173.0 lb

## 2023-08-09 DIAGNOSIS — E782 Mixed hyperlipidemia: Secondary | ICD-10-CM

## 2023-08-09 DIAGNOSIS — I2510 Atherosclerotic heart disease of native coronary artery without angina pectoris: Secondary | ICD-10-CM | POA: Diagnosis not present

## 2023-08-09 DIAGNOSIS — I10 Essential (primary) hypertension: Secondary | ICD-10-CM

## 2023-08-09 DIAGNOSIS — I739 Peripheral vascular disease, unspecified: Secondary | ICD-10-CM

## 2023-08-09 MED ORDER — EVOLOCUMAB 140 MG/ML ~~LOC~~ SOAJ
140.0000 mg | Freq: Once | SUBCUTANEOUS | Status: AC
Start: 2023-08-09 — End: 2023-08-09
  Administered 2023-08-09: 140 mg via SUBCUTANEOUS

## 2023-08-09 NOTE — Progress Notes (Signed)
Patient referred by Jackie Plum, MD for abnormal ABI  Subjective:   [facility], female    DOB: 14-Aug-1961, 62 y.o.   MRN: [id number]   Chief Complaint  Patient presents with   Mixed hyperlipidemia   Coronary artery disease involving native coronary artery of   Follow-up    3 months    HPI  61 y/o African-American female with hypertension, type 2 diabetes mellitus, hyperlipidemia, coronary artery disease, PAD, nonischemic cardiomyopathy with now recovered EF, pulmonary hypertension  In the last few months, her renal function had significantly reduced with creatinine up to 3.6.  It has reduced on 1.7 as of 05/2023.  Patient is doing well and does not have any complaints today.  .  Current Outpatient Medications:    ACCU-CHEK AVIVA PLUS test strip, 1 each 3 (three) times daily., Disp: , Rfl:    Accu-Chek Softclix Lancets lancets, 1 each 3 (three) times daily., Disp: , Rfl:    acetaminophen (TYLENOL) 325 MG tablet, Take 325-650 mg by mouth every 6 (six) hours as needed for mild pain (depends on pain)., Disp: , Rfl:    albuterol (PROVENTIL HFA;VENTOLIN HFA) 108 (90 BASE) MCG/ACT inhaler, Inhale 2 puffs into the lungs every 4 (four) hours as needed for wheezing or shortness of breath., Disp: , Rfl:    aspirin EC 81 MG tablet, Take 1 tablet (81 mg total) by mouth daily. Swallow whole., Disp: 90 tablet, Rfl: 3   carvedilol (COREG) 12.5 MG tablet, Take 3.125 mg by mouth 2 (two) times daily., Disp: , Rfl:    Cholecalciferol (VITAMIN D PO), Take 125 mcg by mouth in the morning and at bedtime.  (Patient not taking: Reported on 08/01/2023), Disp: , Rfl:    cilostazol (PLETAL) 50 MG tablet, Take 1 tablet (50 mg total) by mouth 2 (two) times daily., Disp: 60 tablet, Rfl: 2   Evolocumab (REPATHA SURECLICK) 140 MG/ML SOAJ, INJECT 1 ML INTO THE SKIN ONCE EVERY 14 DAYS, Disp: 6 mL, Rfl: 3   ezetimibe (ZETIA) 10 MG tablet, Take 10 mg by mouth daily., Disp: , Rfl:    ferrous sulfate 325  (65 FE) MG tablet, Take 325 mg by mouth daily with breakfast., Disp: , Rfl:    fluorometholone (FML) 0.1 % ophthalmic suspension, SMARTSIG:In Eye(s), Disp: , Rfl:    furosemide (LASIX) 40 MG tablet, Take 1 tablet (40 mg total) by mouth daily., Disp: 30 tablet, Rfl: 11   gabapentin (NEURONTIN) 300 MG capsule, Take 300 mg by mouth at bedtime., Disp: , Rfl:    glipiZIDE (GLUCOTROL) 5 MG tablet, Take 1 tablet (5 mg total) by mouth 2 (two) times daily before a meal., Disp: 180 tablet, Rfl: 3   insulin degludec (TRESIBA FLEXTOUCH) 100 UNIT/ML FlexTouch Pen, Inject 14 Units into the skin daily., Disp: 15 mL, Rfl: 3   Insulin Pen Needle 32G X 4 MM MISC, 1 Device by Does not apply route daily in the afternoon., Disp: 100 each, Rfl: 3   JARDIANCE 25 MG TABS tablet, Take 25 mg by mouth daily., Disp: , Rfl:    lisinopril (ZESTRIL) 10 MG tablet, Take 10 mg by mouth daily. , Disp: , Rfl:    omega-3 acid ethyl esters (LOVAZA) 1 g capsule, Take 2 capsules by mouth 2 (two) times daily. (Patient not taking: Reported on 08/01/2023), Disp: , Rfl:    Potassium Chloride ER 20 MEQ TBCR, Take 1 tablet by mouth daily., Disp: , Rfl:    RESTASIS 0.05 % ophthalmic emulsion,  1 drop 2 (two) times daily., Disp: , Rfl:    rosuvastatin (CRESTOR) 40 MG tablet, Take 1 tablet (40 mg total) by mouth at bedtime., Disp: 30 tablet, Rfl: 11   Semaglutide,0.25 or 0.5MG /DOS, 2 MG/3ML SOPN, Inject 0.5 mg into the skin once a week., Disp: 9 mL, Rfl: 3   spironolactone (ALDACTONE) 25 MG tablet, Take 25 mg by mouth daily. (Patient not taking: Reported on 08/01/2023), Disp: , Rfl:   Cardiovascular and other pertinent studies:  EKG 11/16/2022: Sinus rhythm 57 bpm First degree A-V block  Low voltage in precordial leads Old inferior infarct Nonspecific T-abnormality  Echocardiogram 03/09/2020:  Normal LV systolic function with visual EF 55-60%. Left ventricle cavity  is normal in size. Moderate left ventricular hypertrophy. Normal global   wall motion. No obvious regional wall motion abnormalities. Doppler  evidence of grade I diastolic dysfunction, elevated LAP. Calculated EF  55%.  No significant valvular abnormalities.  IVC is normal with a respiratory response of <50%.  No significant change compared to prior study dated 11/2015 except Grade I  diastolic dysfunction is new.   ABI 02/06/2020: Rt 0.68 Lt 0.88  LHC/RHC 06/2013: LM: No significant disease LAD: 50% mid LAD stenosis with distal tapering.  LCx: Moderate early OM1 with 50% ostial stenosis. Mid LCx with 40% stenosis.  RCA: Diffuse RCA disease with 70-80% ostial stenosis and damping of the waveform; serial 50% stenoses in the mid and distal RCA.  Reviewed with Dr. Clifton James.  70-80% stenosis with ostial damping in setting of relatively small caliber RCA.  The RCA is diffusely disease.  In the absence of ACS (no chest pain), would treat medically  Left ventriculography: Global hypokinesis with EF estimated at 35%.  Optimized PCWP on current dose of Lasix (16 mmHg).   Pulmonary arterial HTN with elevated PVR, not responsive to adenosine.  Given co-existing LV dysfunction, I am going to try her on sildenafil 20 mg tid.   She will followup in clinic in about 2 wks.   RHC 05/2013: Mean RA:  20  mmHg RV: 76/12 mmHg PA: 75/36 mmHg with a mean of 49 mmHg PCWP: 27 mmHg   Ao sat : 94% PA sat 50%    Fick Cardiac Output:  3.1 L/minute,  1.8 L/min minute INR INR ute/M2   PVR: 8.0 wood unites 567 dysnes-sec cm5   Impression:  PAD significantly higher than PCWP indicating some intrinsic pulmonary hypertension.  Increase diuretics and add nitrates Refer to CHF clinic and continue f/u with pulmonary ? Flolan  Recent labs: 05/30/2023: Glucose 102, BUN/Cr 13/1.75. EGFR NA  04/18/2023: HbA1C 7.2%  02/02/2023: Glucose 227, BUN/Cr 29/3.6. EGFR 15. K 4.2 Hb 10.3  03/07/2022: Glucose 332, BUN/Cr 12/1.25. EGFR 46. Na/K 138/4.4.  HbA1C 11.4%  10/27/2021: Chol 88, TG  121, HDL 46, LDL 20  Review of Systems  Cardiovascular: Positive for claudication. Negative for chest pain, dyspnea on exertion, leg swelling, palpitations and syncope.       There were no vitals filed for this visit.    There is no height or weight on file to calculate BMI. There were no vitals filed for this visit.    Objective:    Physical Exam Vitals and nursing note reviewed.  Constitutional:      General: She is not in acute distress. Neck:     Vascular: No JVD.  Cardiovascular:     Rate and Rhythm: Normal rate and regular rhythm.     Pulses: Intact distal pulses.  Femoral pulses are 1+ on the right side and 1+ on the left side.      Dorsalis pedis pulses are 0 on the right side and 1+ on the left side.       Posterior tibial pulses are 0 on the right side and 0 on the left side.     Heart sounds: Normal heart sounds. No murmur heard. Pulmonary:     Effort: Pulmonary effort is normal.     Breath sounds: Normal breath sounds. No wheezing or rales.  Musculoskeletal:     Right lower leg: No edema.     Left lower leg: No edema.            Assessment & Recommendations:   62 y/o African-American female with hypertension, type 2 diabetes mellitus, hyperlipidemia, coronary artery disease, PAD, CKD IIII-IV, nonischemic cardiomyopathy with now recovered EF, pulmonary hypertension  Hypertension: Well-controlled.  PAD: No critical limb ischemia.  Abnormal ABI Rt 0.68, Lt 0.78 (01/2020). Rutherford class III claudication, controlled with medical therapy. Change plavix 75 mg daily to Aspirin 81 mg daily, continue Pletal 50 mg bid. Needs aggressive risk factor modification, especially diabetes and hyperlipidemia, see below.    Mixed hyperlipidemia: LDL down to 20 on Repatha and Crestor (10/2021). Check lipid panel today.  Evolocumab SOAJ 140 mgDose: 140 mg  :  Subcutaneous  :   Once Order ID: [id number]  Ordered Admin Dose: 140 mg  Last Admin: Today  08/09/23 at 1002 (Given)    Given : 08/09/23 : 140 mg : Right Lower Abdomen New Administration    Type 2 DM: A1c down to 7.2%.  Continue follow-up with PCP.  Coronary artery disease: No angina symptoms at this time.  Continue medical therapy.  F/u in 6 months    Elder Negus, MD Pager: [phone] Office: [phone]

## 2023-08-15 NOTE — Progress Notes (Signed)
No action done

## 2023-08-19 ENCOUNTER — Encounter: Payer: Self-pay | Admitting: Certified Registered Nurse Anesthetist

## 2023-08-22 ENCOUNTER — Encounter: Payer: Self-pay | Admitting: Internal Medicine

## 2023-08-22 ENCOUNTER — Ambulatory Visit (INDEPENDENT_AMBULATORY_CARE_PROVIDER_SITE_OTHER): Payer: Medicare HMO | Admitting: Internal Medicine

## 2023-08-22 VITALS — BP 130/65 | HR 61 | Ht 61.0 in | Wt 171.0 lb

## 2023-08-22 DIAGNOSIS — E1142 Type 2 diabetes mellitus with diabetic polyneuropathy: Secondary | ICD-10-CM

## 2023-08-22 DIAGNOSIS — E1122 Type 2 diabetes mellitus with diabetic chronic kidney disease: Secondary | ICD-10-CM | POA: Diagnosis not present

## 2023-08-22 DIAGNOSIS — E1159 Type 2 diabetes mellitus with other circulatory complications: Secondary | ICD-10-CM

## 2023-08-22 DIAGNOSIS — Z794 Long term (current) use of insulin: Secondary | ICD-10-CM | POA: Diagnosis not present

## 2023-08-22 DIAGNOSIS — N184 Chronic kidney disease, stage 4 (severe): Secondary | ICD-10-CM

## 2023-08-22 DIAGNOSIS — Z7985 Long-term (current) use of injectable non-insulin antidiabetic drugs: Secondary | ICD-10-CM | POA: Diagnosis not present

## 2023-08-22 LAB — POCT GLYCOSYLATED HEMOGLOBIN (HGB A1C): Hemoglobin A1C: 6.5 % — AB (ref 4.0–5.6)

## 2023-08-22 MED ORDER — TRESIBA FLEXTOUCH 100 UNIT/ML ~~LOC~~ SOPN: 14.0000 [IU] | PEN_INJECTOR | Freq: Every day | SUBCUTANEOUS | 3 refills | Status: DC

## 2023-08-22 MED ORDER — SEMAGLUTIDE (1 MG/DOSE) 4 MG/3ML ~~LOC~~ SOPN: 1.0000 mg | PEN_INJECTOR | SUBCUTANEOUS | 3 refills | Status: DC

## 2023-08-22 MED ORDER — INSULIN PEN NEEDLE 32G X 4 MM MISC: 1.0000 | Freq: Every day | 3 refills | Status: DC

## 2023-08-22 NOTE — Progress Notes (Signed)
Name: [facility]  MRN/ DOB: [id number], 10-17-1961   Age/ Sex: 62 y.o., female    PCP: Jackie Plum, MD   Reason for Endocrinology Evaluation: Type 2 Diabetes Mellitus     Date of Initial Endocrinology Visit: 03/07/2022    PATIENT IDENTIFIER: [name] is a 62 y.o. female with a past medical history of T2DM, CKD, CHF, HTN , CAD and dyslipidemia . The patient presented for initial endocrinology clinic visit on 03/07/2022 for consultative assistance with her diabetes management.    HPI: [name] was    Diagnosed with DM ~ 9 yrs  Hemoglobin A1c has ranged from 11.8% in 12/2020, peaking at 12.2% in 06/2021.   Follows with [facility]   On her initial visit her A1c was 11.4%, she was already on metformin, and glipizide, she declined insulin.  We started her on Farxiga   Farxiga and Metformin discontinued due to low GFR 11/2022  Started  basal insulin 01/2023  Started Ozempic 03/2023  SUBJECTIVE:   During the last visit (04/18/2023): A1c 7.2%       Today (08/22/23): [name] is here for follow-up on diabetes management.she checks her blood sugars 2x  daily . The patient has  had hypoglycemic episodes since the last clinic visit. She is symptomatic with these episodes.   She continues to follow-up with cardiology last visit was 08/09/2023 for mixed hyperlipidemia and CAD   She was seen by nephrology 02/02/2023-Dr. Ronalee Belts  Denies nausea or vomiting  Denies constipation or diarrhea     HOME DIABETES REGIMEN: Glipizide 5 mg, BID Ozempic 0.5 mg weekly Tresiba 14 units daily     Statin: yes ACE-I/ARB: yes    METER DOWNLOAD SUMMARY: unable to download  22- 242  mg/dL    DIABETIC COMPLICATIONS: Microvascular complications:  CKD III , neuropathy  Denies: retinopathy  Last eye exam: Completed 02/2022  Macrovascular complications:  CHF, CVA , PAD   PAST HISTORY: Past Medical History:  Past Medical History:  Diagnosis Date   Asthma     prn - occasional uses inhaler   Chronic systolic CHF (congestive heart failure) (HCC)    Diabetes mellitus without complication (HCC)    borderline, being monitored, no meds   Hypercholesterolemia    Hypertension    Microcytic anemia    Nonischemic cardiomyopathy (HCC)    a. 2005 Cath: nonobstructive dzs;  b. 04/2013 Echo: EF 3035%, diff HK, restrictive physiology, mildly dil LA, PASP .   SVD (spontaneous vaginal delivery)    x 3   Past Surgical History:  Past Surgical History:  Procedure Laterality Date   BILATERAL SALPINGECTOMY Bilateral 01/15/2015   Procedure: BILATERAL SALPINGECTOMY;  Surgeon: Serita Kyle, MD;  Location: [facility];  Service: Gynecology;  Laterality: Bilateral;   COLONOSCOPY     CORONARY ANGIOPLASTY  12/20/2003   LEFT AND RIGHT HEART CATHETERIZATION WITH CORONARY ANGIOGRAM N/A 07/05/2013   Procedure: LEFT AND RIGHT HEART CATHETERIZATION WITH CORONARY ANGIOGRAM;  Surgeon: Laurey Morale, MD;  Location: [facility] CATH LAB;  Service: Cardiovascular;  Laterality: N/A;   RIGHT HEART CATHETERIZATION N/A 06/13/2013   Procedure: RIGHT HEART CATH;  Surgeon: Wendall Stade, MD;  Location: [facility] CATH LAB;  Service: Cardiovascular;  Laterality: N/A;   ROBOTIC ASSISTED TOTAL HYSTERECTOMY N/A 01/15/2015   Procedure: ROBOTIC ASSISTED TOTAL HYSTERECTOMY;  Surgeon: Serita Kyle, MD;  Location: [facility];  Service: Gynecology;  Laterality: N/A;   TUBAL LIGATION     WISDOM TOOTH EXTRACTION  Social History:  reports that she has never smoked. She has never used smokeless tobacco. She reports that she does not drink alcohol and does not use drugs. Family History:  Family History  Problem Relation Age of Onset   Hypertension Sister    Hyperlipidemia Sister    Hypertension Sister    Hyperlipidemia Sister    Heart disease Brother        History of heart transplant   Hypertension Brother    Heart disease Brother        Coronary artery disease   Hypertension Brother     Heart disease Brother    Hypertension Brother    Heart disease Brother    Hypertension Brother    Colon cancer Neg Hx    Colon polyps Neg Hx    Esophageal cancer Neg Hx    Rectal cancer Neg Hx    Stomach cancer Neg Hx      HOME MEDICATIONS: Allergies as of 08/22/2023   No Known Allergies      Medication List        Accurate as of August 22, 2023 12:12 PM. If you have any questions, ask your nurse or doctor.          STOP taking these medications    glipiZIDE 5 MG tablet Commonly known as: GLUCOTROL Stopped by: Johnney Ou Ellesse Antenucci   Jardiance 25 MG Tabs tablet Generic drug: empagliflozin Stopped by: Johnney Ou Laikyn Gewirtz   Semaglutide(0.25 or 0.5MG /DOS) 2 MG/3ML Sopn Replaced by: Semaglutide (1 MG/DOSE) 4 MG/3ML Sopn Stopped by: Johnney Ou Yul Diana       TAKE these medications    Accu-Chek Aviva Plus test strip Generic drug: glucose blood 1 each 3 (three) times daily.   Accu-Chek Softclix Lancets lancets 1 each 3 (three) times daily.   acetaminophen 325 MG tablet Commonly known as: TYLENOL Take 325-650 mg by mouth every 6 (six) hours as needed for mild pain (depends on pain).   albuterol 108 (90 Base) MCG/ACT inhaler Commonly known as: VENTOLIN HFA Inhale 2 puffs into the lungs every 4 (four) hours as needed for wheezing or shortness of breath.   aspirin EC 81 MG tablet Take 1 tablet (81 mg total) by mouth daily. Swallow whole.   carvedilol 12.5 MG tablet Commonly known as: COREG Take 3.125 mg by mouth 2 (two) times daily.   cilostazol 50 MG tablet Commonly known as: PLETAL Take 1 tablet (50 mg total) by mouth 2 (two) times daily.   ezetimibe 10 MG tablet Commonly known as: ZETIA Take 10 mg by mouth daily.   ferrous sulfate 325 (65 FE) MG tablet Take 325 mg by mouth daily with breakfast.   fluorometholone 0.1 % ophthalmic suspension Commonly known as: FML SMARTSIG:In Eye(s)   furosemide 40 MG tablet Commonly known as: LASIX Take  1 tablet (40 mg total) by mouth daily.   gabapentin 300 MG capsule Commonly known as: NEURONTIN Take 300 mg by mouth at bedtime.   Insulin Pen Needle 32G X 4 MM Misc 1 Device by Does not apply route daily in the afternoon.   lisinopril 10 MG tablet Commonly known as: ZESTRIL Take 10 mg by mouth daily.   omega-3 acid ethyl esters 1 g capsule Commonly known as: LOVAZA Take 2 capsules by mouth 2 (two) times daily.   Potassium Chloride ER 20 MEQ Tbcr Take 1 tablet by mouth daily.   Repatha SureClick 140 MG/ML Soaj Generic drug: Evolocumab INJECT 1 ML INTO THE SKIN ONCE EVERY  14 DAYS   Restasis 0.05 % ophthalmic emulsion Generic drug: cycloSPORINE 1 drop 2 (two) times daily.   rosuvastatin 40 MG tablet Commonly known as: CRESTOR Take 1 tablet (40 mg total) by mouth at bedtime.   Semaglutide (1 MG/DOSE) 4 MG/3ML Sopn Inject 1 mg as directed once a week. Replaces: Semaglutide(0.25 or 0.5MG /DOS) 2 MG/3ML Sopn Started by: Johnney Ou Caralee Morea   spironolactone 25 MG tablet Commonly known as: ALDACTONE Take 25 mg by mouth daily.   Evaristo Bury FlexTouch 100 UNIT/ML FlexTouch Pen Generic drug: insulin degludec Inject 14 Units into the skin daily.   VITAMIN D PO Take 125 mcg by mouth in the morning and at bedtime.         ALLERGIES: No Known Allergies       OBJECTIVE:   VITAL SIGNS: BP 130/65   Pulse 61   Ht 5\' 1"  (1.549 m)   Wt 171 lb (77.6 kg)   LMP 01/01/2015 (Approximate)   SpO2 99%   BMI 32.31 kg/m    PHYSICAL EXAM:  General: Pt appears well and is in NAD  Neck: General: Supple without adenopathy or carotid bruits. Thyroid: Thyroid size normal.  No goiter or nodules appreciated.   Lungs: Clear with good BS bilat with no rales, rhonchi, or wheezes  Heart: RRR   Extremities:  Lower extremities - No pretibial edema. No lesions.  Neuro: MS is good with appropriate affect, pt is alert and Ox3    DM foot exam: 04/18/2023  The skin of the feet is intact  without sores or ulcerations. The pedal pulses are 2+ on right and 2+ on left. The sensation is intact to a screening 5.07, 10 gram monofilament bilaterally    DATA REVIEWED:  Lab Results  Component Value Date   HGBA1C 6.5 (A) 08/22/2023   HGBA1C 7.2 (A) 04/18/2023   HGBA1C 10.3 (A) 11/28/2022      05/30/2023 BUN 13 Cr. 1.75 GFR 15.390   Old records , labs and images have been reviewed.    ASSESSMENT / PLAN / RECOMMENDATIONS:   1) Type 2 Diabetes Mellitus, Optimally Controlled , With neuropathic, CKD IV and macrovascular  complications - Most recent A1c of 6.5%. Goal A1c < 7.0 %.    A1c continues to be optimal Patient states that she has been checking glucose twice daily, in reviewing glucose meter there was a gap and glucose checks from 5/29 until 8/25.  Over the past week that has been 2-4 times a day glucose checks, one of the readings 22 Mg/DL at 96:04 PM Will discontinue glipizide as below Will increase Ozempic as below, as she is tolerating it well Metformin and SGLT2 inhibitors had to be discontinued with a GFR of  15 by 11/2022 Labs from outside facility were reviewed and show improvement in the BUN/CR  MEDICATIONS: Increase Ozempic 1mg  weekly  STOP Glipizide  Continue Tresiba 14 units daily     EDUCATION / INSTRUCTIONS: BG monitoring instructions: Patient is instructed to check her blood sugars 1 times a day, fasting . Call [facility] if: BG persistently < 70  I reviewed the Rule of 15 for the treatment of hypoglycemia in detail with the patient. Literature supplied.   2) Diabetic complications:  Eye: Does not have known diabetic retinopathy.  Neuro/ Feet: Does  have known diabetic peripheral neuropathy. Renal: Patient does  have known baseline CKD. She is  on an ACEI/ARB at present.Follows with [facility]   3) CAD/PVD/Dyslipidemia :   -  Per cardiology   Follow-up in 4 months    Signed electronically by: Lyndle Herrlich, MD  [facility] Endocrinology  [facility] [address] [place] [place], [place] [address] Phone: [phone] FAX: [phone]   CC: Jackie Plum, MD [address] [place] [address] Phone: [phone]  Fax: [phone]    Return to Endocrinology clinic as below: Future Appointments  Date Time Provider Department Center  08/24/2023 11:30 AM Imogene Burn, MD [facility]  08/30/2023  9:00 AM [facility]-NURSE [facility] None  02/09/2024  9:30 AM Patwardhan, Anabel Bene, MD [facility] None

## 2023-08-22 NOTE — Patient Instructions (Signed)
Stop Glipizide  Increase Ozempic 1 mg weekly  Continue Tresiba 14 units daily      HOW TO TREAT LOW BLOOD SUGARS (Blood sugar LESS THAN 70 MG/DL) Please follow the RULE OF 15 for the treatment of hypoglycemia treatment (when your (blood sugars are less than 70 mg/dL)   STEP 1: Take 15 grams of carbohydrates when your blood sugar is low, which includes:  3-4 GLUCOSE TABS  OR 3-4 OZ OF JUICE OR REGULAR SODA OR ONE TUBE OF GLUCOSE GEL    STEP 2: RECHECK blood sugar in 15 MINUTES STEP 3: If your blood sugar is still low at the 15 minute recheck --> then, go back to STEP 1 and treat AGAIN with another 15 grams of carbohydrates.

## 2023-08-24 ENCOUNTER — Ambulatory Visit (AMBULATORY_SURGERY_CENTER): Payer: Medicare HMO | Admitting: Internal Medicine

## 2023-08-24 ENCOUNTER — Encounter: Payer: Self-pay | Admitting: Internal Medicine

## 2023-08-24 VITALS — BP 148/80 | HR 61 | Temp 97.8°F | Resp 10 | Ht 61.0 in | Wt 172.0 lb

## 2023-08-24 DIAGNOSIS — E119 Type 2 diabetes mellitus without complications: Secondary | ICD-10-CM | POA: Diagnosis not present

## 2023-08-24 DIAGNOSIS — J45909 Unspecified asthma, uncomplicated: Secondary | ICD-10-CM | POA: Diagnosis not present

## 2023-08-24 DIAGNOSIS — Z1211 Encounter for screening for malignant neoplasm of colon: Secondary | ICD-10-CM | POA: Diagnosis not present

## 2023-08-24 DIAGNOSIS — D124 Benign neoplasm of descending colon: Secondary | ICD-10-CM | POA: Diagnosis not present

## 2023-08-24 MED ORDER — SODIUM CHLORIDE 0.9 % IV SOLN: 500.0000 mL | Freq: Once | INTRAVENOUS | Status: DC

## 2023-08-24 NOTE — Progress Notes (Signed)
Report given to PACU, vss 

## 2023-08-24 NOTE — Op Note (Signed)
[facility] Patient Name: [facility] Procedure Date: 08/24/2023 11:22 AM MRN: [id number] Endoscopist: Particia Lather , , [id number] Age: 62 Referring MD:  Date of Birth: [birth year] Gender: Female Account #: [id number] Procedure:                Colonoscopy Indications:              Screening for colorectal malignant neoplasm Medicines:                Monitored Anesthesia Care Procedure:                Pre-Anesthesia Assessment:                           - Prior to the procedure, a History and Physical                            was performed, and patient medications and                            allergies were reviewed. The patient's tolerance of                            previous anesthesia was also reviewed. The risks                            and benefits of the procedure and the sedation                            options and risks were discussed with the patient.                            All questions were answered, and informed consent                            was obtained. Prior Anticoagulants: The patient has                            taken Pletal (cilostazol), last dose was 5 days                            prior to procedure. ASA Grade Assessment: III - A                            patient with severe systemic disease. After                            reviewing the risks and benefits, the patient was                            deemed in satisfactory condition to undergo the                            procedure.  After obtaining informed consent, the colonoscope                            was passed under direct vision. Throughout the                            procedure, the patient's blood pressure, pulse, and                            oxygen saturations were monitored continuously. The                            Olympus [id number]) Colonoscope was                            introduced through the anus and  advanced to the the                            cecum, identified by appendiceal orifice and                            ileocecal valve. Scope In: 11:32:15 AM Scope Out: 11:48:01 AM Scope Withdrawal Time: 0 hours 11 minutes 35 seconds  Total Procedure Duration: 0 hours 15 minutes 46 seconds  Findings:                 Two sessile polyps were found in the descending                            colon. The polyps were 2 to 5 mm in size. These                            polyps were removed with a cold snare. Resection                            was complete, but the polyp tissue was only                            partially retrieved.                           Non-bleeding internal hemorrhoids were found during                            retroflexion. Complications:            No immediate complications. Estimated Blood Loss:     Estimated blood loss was minimal. Impression:               - Two 2 to 5 mm polyps in the descending colon,                            removed with a cold snare. Complete resection.  Partial retrieval.                           - Non-bleeding internal hemorrhoids. Recommendation:           - Discharge patient to home (with escort).                           - Await pathology results.                           - Okay to restart your Pletal tomorrow.                           - The findings and recommendations were discussed                            with the patient. Dr Particia Lather "Alan Ripper" Leonides Schanz,  08/24/2023 11:50:39 AM

## 2023-08-24 NOTE — Progress Notes (Signed)
Called to room to assist during endoscopic procedure.  Patient ID and intended procedure confirmed with present staff. Received instructions for my participation in the procedure from the performing physician.  

## 2023-08-24 NOTE — Progress Notes (Signed)
VS by SS  Pt's states no medical or surgical changes since previsit or office visit.  

## 2023-08-24 NOTE — Progress Notes (Signed)
GASTROENTEROLOGY PROCEDURE H&P NOTE   Primary Care Physician: Jackie Plum, MD    Reason for Procedure:   Colon cancer screening  Plan:    Colonoscopy  Patient is appropriate for endoscopic procedure(s) in the ambulatory ([facility]) setting.  The nature of the procedure, as well as the risks, benefits, and alternatives were carefully and thoroughly reviewed with the patient. Ample time for discussion and questions allowed. The patient understood, was satisfied, and agreed to proceed.     HPI: [name] is a 62 y.o. female who presents for colonoscopy for colon cancer screening. Denies blood in stools, changes in bowel habits, or unintentional weight loss. Denies family history of colon cancer. Last colonoscopy was 10 years ago and was normal.  Past Medical History:  Diagnosis Date   Asthma    prn - occasional uses inhaler   Chronic systolic CHF (congestive heart failure) (HCC)    Diabetes mellitus without complication (HCC)    borderline, being monitored, no meds   Hypercholesterolemia    Hypertension    Microcytic anemia    Nonischemic cardiomyopathy (HCC)    a. 2005 Cath: nonobstructive dzs;  b. 04/2013 Echo: EF 3035%, diff HK, restrictive physiology, mildly dil LA, PASP .   SVD (spontaneous vaginal delivery)    x 3    Past Surgical History:  Procedure Laterality Date   BILATERAL SALPINGECTOMY Bilateral 01/15/2015   Procedure: BILATERAL SALPINGECTOMY;  Surgeon: Serita Kyle, MD;  Location: [facility];  Service: Gynecology;  Laterality: Bilateral;   COLONOSCOPY     CORONARY ANGIOPLASTY  12/20/2003   LEFT AND RIGHT HEART CATHETERIZATION WITH CORONARY ANGIOGRAM N/A 07/05/2013   Procedure: LEFT AND RIGHT HEART CATHETERIZATION WITH CORONARY ANGIOGRAM;  Surgeon: Laurey Morale, MD;  Location: [facility] CATH LAB;  Service: Cardiovascular;  Laterality: N/A;   RIGHT HEART CATHETERIZATION N/A 06/13/2013   Procedure: RIGHT HEART CATH;  Surgeon: Wendall Stade, MD;   Location: [facility] CATH LAB;  Service: Cardiovascular;  Laterality: N/A;   ROBOTIC ASSISTED TOTAL HYSTERECTOMY N/A 01/15/2015   Procedure: ROBOTIC ASSISTED TOTAL HYSTERECTOMY;  Surgeon: Serita Kyle, MD;  Location: [facility];  Service: Gynecology;  Laterality: N/A;   TUBAL LIGATION     WISDOM TOOTH EXTRACTION      Prior to Admission medications   Medication Sig Start Date End Date Taking? Authorizing Provider  ACCU-CHEK AVIVA PLUS test strip 1 each 3 (three) times daily. 10/12/21  Yes [provider]  Accu-Chek Softclix Lancets lancets 1 each 3 (three) times daily. 10/12/21  Yes [provider]  acetaminophen (TYLENOL) 325 MG tablet Take 325-650 mg by mouth every 6 (six) hours as needed for mild pain (depends on pain).   Yes [provider]  albuterol (PROVENTIL HFA;VENTOLIN HFA) 108 (90 BASE) MCG/ACT inhaler Inhale 2 puffs into the lungs every 4 (four) hours as needed for wheezing or shortness of breath. 04/08/13  Yes Junious Silk, PA-C  aspirin EC 81 MG tablet Take 1 tablet (81 mg total) by mouth daily. Swallow whole. 11/16/22  Yes Patwardhan, Manish J, MD  carvedilol (COREG) 12.5 MG tablet Take 3.125 mg by mouth 2 (two) times daily. 07/16/23  Yes [provider]  ezetimibe (ZETIA) 10 MG tablet Take 10 mg by mouth daily. 09/05/22  Yes [provider]  ferrous sulfate 325 (65 FE) MG tablet Take 325 mg by mouth daily with breakfast.   Yes [provider]  fluorometholone (FML) 0.1 % ophthalmic suspension SMARTSIG:In Eye(s) 03/01/23  Yes [provider]  furosemide (LASIX) 40 MG tablet Take 1 tablet (40 mg total) by mouth daily. 02/13/17  Yes Bensimhon, Bevelyn Buckles, MD  gabapentin (NEURONTIN) 300 MG capsule Take 300 mg by mouth at bedtime. 04/19/23  Yes [provider]  insulin degludec (TRESIBA FLEXTOUCH) 100 UNIT/ML FlexTouch Pen Inject 14 Units into the skin daily. 08/22/23  Yes Shamleffer, Konrad Dolores, MD  Insulin Pen Needle  32G X 4 MM MISC 1 Device by Does not apply route daily in the afternoon. 08/22/23  Yes Shamleffer, Konrad Dolores, MD  lisinopril (ZESTRIL) 10 MG tablet Take 10 mg by mouth daily.    Yes [provider]  Potassium Chloride ER 20 MEQ TBCR Take 1 tablet by mouth daily. 07/18/23  Yes [provider]  RESTASIS 0.05 % ophthalmic emulsion 1 drop 2 (two) times daily. 05/30/23  Yes [provider]  rosuvastatin (CRESTOR) 40 MG tablet Take 1 tablet (40 mg total) by mouth at bedtime. 02/13/17  Yes Bensimhon, Bevelyn Buckles, MD  spironolactone (ALDACTONE) 25 MG tablet Take 25 mg by mouth daily.   Yes [provider]  Cholecalciferol (VITAMIN D PO) Take 125 mcg by mouth in the morning and at bedtime.  Patient not taking: Reported on 08/09/2023    [provider]  cilostazol (PLETAL) 50 MG tablet Take 1 tablet (50 mg total) by mouth 2 (two) times daily. 07/01/21   Patwardhan, Manish J, MD  Evolocumab (REPATHA SURECLICK) 140 MG/ML SOAJ INJECT 1 ML INTO THE SKIN ONCE EVERY 14 DAYS 11/14/22   Yates Decamp, MD  omega-3 acid ethyl esters (LOVAZA) 1 g capsule Take 2 capsules by mouth 2 (two) times daily. Patient not taking: Reported on 08/24/2023 11/17/21   [provider]  Semaglutide, 1 MG/DOSE, 4 MG/3ML SOPN Inject 1 mg as directed once a week. 08/22/23   Shamleffer, Konrad Dolores, MD    Current Outpatient Medications  Medication Sig Dispense Refill   ACCU-CHEK AVIVA PLUS test strip 1 each 3 (three) times daily.     Accu-Chek Softclix Lancets lancets 1 each 3 (three) times daily.     acetaminophen (TYLENOL) 325 MG tablet Take 325-650 mg by mouth every 6 (six) hours as needed for mild pain (depends on pain).     albuterol (PROVENTIL HFA;VENTOLIN HFA) 108 (90 BASE) MCG/ACT inhaler Inhale 2 puffs into the lungs every 4 (four) hours as needed for wheezing or shortness of breath.     aspirin EC 81 MG tablet Take 1 tablet (81 mg total) by mouth daily. Swallow whole. 90 tablet 3    carvedilol (COREG) 12.5 MG tablet Take 3.125 mg by mouth 2 (two) times daily.     ezetimibe (ZETIA) 10 MG tablet Take 10 mg by mouth daily.     ferrous sulfate 325 (65 FE) MG tablet Take 325 mg by mouth daily with breakfast.     fluorometholone (FML) 0.1 % ophthalmic suspension SMARTSIG:In Eye(s)     furosemide (LASIX) 40 MG tablet Take 1 tablet (40 mg total) by mouth daily. 30 tablet 11   gabapentin (NEURONTIN) 300 MG capsule Take 300 mg by mouth at bedtime.     insulin degludec (TRESIBA FLEXTOUCH) 100 UNIT/ML FlexTouch Pen Inject 14 Units into the skin daily. 15 mL 3   Insulin Pen Needle 32G X 4 MM MISC 1 Device by Does not apply route daily in the afternoon. 100 each 3   lisinopril (ZESTRIL) 10 MG tablet Take 10 mg by mouth daily.      Potassium  Chloride ER 20 MEQ TBCR Take 1 tablet by mouth daily.     RESTASIS 0.05 % ophthalmic emulsion 1 drop 2 (two) times daily.     rosuvastatin (CRESTOR) 40 MG tablet Take 1 tablet (40 mg total) by mouth at bedtime. 30 tablet 11   spironolactone (ALDACTONE) 25 MG tablet Take 25 mg by mouth daily.     Cholecalciferol (VITAMIN D PO) Take 125 mcg by mouth in the morning and at bedtime.  (Patient not taking: Reported on 08/09/2023)     cilostazol (PLETAL) 50 MG tablet Take 1 tablet (50 mg total) by mouth 2 (two) times daily. 60 tablet 2   Evolocumab (REPATHA SURECLICK) 140 MG/ML SOAJ INJECT 1 ML INTO THE SKIN ONCE EVERY 14 DAYS 6 mL 3   omega-3 acid ethyl esters (LOVAZA) 1 g capsule Take 2 capsules by mouth 2 (two) times daily. (Patient not taking: Reported on 08/24/2023)     Semaglutide, 1 MG/DOSE, 4 MG/3ML SOPN Inject 1 mg as directed once a week. 9 mL 3   Current Facility-Administered Medications  Medication Dose Route Frequency Provider Last Rate Last Admin   0.9 %  sodium chloride infusion  500 mL Intravenous Once Imogene Burn, MD        Allergies as of 08/24/2023   (No Known Allergies)    Family History  Problem Relation Age of Onset    Hypertension Sister    Hyperlipidemia Sister    Hypertension Sister    Hyperlipidemia Sister    Heart disease Brother        History of heart transplant   Hypertension Brother    Heart disease Brother        Coronary artery disease   Hypertension Brother    Heart disease Brother    Hypertension Brother    Heart disease Brother    Hypertension Brother    Colon cancer Neg Hx    Colon polyps Neg Hx    Esophageal cancer Neg Hx    Rectal cancer Neg Hx    Stomach cancer Neg Hx     Social History   Socioeconomic History   Marital status: Single    Spouse name: Not on file   Number of children: 3   Years of education: Not on file   Highest education level: Not on file  Occupational History   Occupation: disabled  Tobacco Use   Smoking status: Never   Smokeless tobacco: Never  Vaping Use   Vaping status: Never Used  Substance and Sexual Activity   Alcohol use: No   Drug use: No   Sexual activity: Yes    Birth control/protection: Surgical  Other Topics Concern   Not on file  Social History Narrative   Not on file   Social Determinants of Health   Financial Resource Strain: Not on file  Food Insecurity: Not on file  Transportation Needs: Not on file  Physical Activity: Not on file  Stress: Not on file  Social Connections: Not on file  Intimate Partner Violence: Not on file    Physical Exam: Vital signs in last 24 hours: BP (!) 117/53   Pulse 64   Temp 97.8 F (36.6 C)   Ht 5\' 1"  (1.549 m)   Wt 172 lb (78 kg)   LMP 01/01/2015 (Approximate)   SpO2 100%   BMI 32.50 kg/m  GEN: NAD EYE: Sclerae anicteric ENT: MMM CV: Non-tachycardic Pulm: No increased work of breathing GI: Soft, NT/ND NEURO:  Alert & Oriented  Eulah Pont, MD [facility] Gastroenter[facility]  08/24/2023 11:16 AM

## 2023-08-24 NOTE — Patient Instructions (Addendum)
Okay to restart Pletal tomorrow 08/25/2023 -Handout on polyps and hemorrhoids provided -await pathology results -repeat colonoscopy for surveillance recommended. Date to be determined when pathology result become available   -Continue present medications    YOU HAD AN ENDOSCOPIC PROCEDURE TODAY AT THE [facility]:   Refer to the procedure report that was given to you for any specific questions about what was found during the examination.  If the procedure report does not answer your questions, please call your gastroenterologist to clarify.  If you requested that your care partner not be given the details of your procedure findings, then the procedure report has been included in a sealed envelope for you to review at your convenience later.  YOU SHOULD EXPECT: Some feelings of bloating in the abdomen. Passage of more gas than usual.  Walking can help get rid of the air that was put into your GI tract during the procedure and reduce the bloating. If you had a lower endoscopy (such as a colonoscopy or flexible sigmoidoscopy) you may notice spotting of blood in your stool or on the toilet paper. If you underwent a bowel prep for your procedure, you may not have a normal bowel movement for a few days.  Please Note:  You might notice some irritation and congestion in your nose or some drainage.  This is from the oxygen used during your procedure.  There is no need for concern and it should clear up in a day or so.  SYMPTOMS TO REPORT IMMEDIATELY:  Following lower endoscopy (colonoscopy or flexible sigmoidoscopy):  Excessive amounts of blood in the stool  Significant tenderness or worsening of abdominal pains  Swelling of the abdomen that is new, acute  Fever of 100F or higher  For urgent or emergent issues, a gastroenterologist can be reached at any hour by calling [phone]. Do not use MyChart messaging for urgent concerns.    DIET:  We do recommend a small meal at first, but  then you may proceed to your regular diet.  Drink plenty of fluids but you should avoid alcoholic beverages for 24 hours.  ACTIVITY:  You should plan to take it easy for the rest of today and you should NOT DRIVE or use heavy machinery until tomorrow (because of the sedation medicines used during the test).    FOLLOW UP: Our staff will call the number listed on your records the next business day following your procedure.  We will call around 7:15- 8:00 am to check on you and address any questions or concerns that you may have regarding the information given to you following your procedure. If we do not reach you, we will leave a message.     If any biopsies were taken you will be contacted by phone or by letter within the next 1-3 weeks.  Please call us at [phone] if you have not heard about the biopsies in 3 weeks.    SIGNATURES/CONFIDENTIALITY: You and/or your care partner have signed paperwork which will be entered into your electronic medical record.  These signatures attest to the fact that that the information above on your After Visit Summary has been reviewed and is understood.  Full responsibility of the confidentiality of this discharge information lies with you and/or your care-partner.

## 2023-08-25 ENCOUNTER — Telehealth: Payer: Self-pay

## 2023-08-25 NOTE — Progress Notes (Signed)
This encounter was created in error - please disregard.

## 2023-08-25 NOTE — Telephone Encounter (Signed)
  Follow up Call-     08/24/2023   10:58 AM  Call back number  Post procedure Call Back phone  # [phone]  Permission to leave phone message Yes     Patient questions:  Do you have a fever, pain , or abdominal swelling? No. Pain Score  0 *  Have you tolerated food without any problems? Yes.    Have you been able to return to your normal activities? Yes.    Do you have any questions about your discharge instructions: Diet   No. Medications  No. Follow up visit  No.  Do you have questions or concerns about your Care? No.  Actions: * If pain score is 4 or above: No action needed, pain <4.

## 2023-08-28 ENCOUNTER — Encounter: Payer: Self-pay | Admitting: Internal Medicine

## 2023-08-30 ENCOUNTER — Ambulatory Visit: Payer: Medicare HMO | Admitting: Cardiology

## 2023-08-30 DIAGNOSIS — E782 Mixed hyperlipidemia: Secondary | ICD-10-CM | POA: Diagnosis not present

## 2023-08-30 MED ORDER — EVOLOCUMAB 140 MG/ML ~~LOC~~ SOAJ
140.0000 mg | Freq: Once | SUBCUTANEOUS | Status: AC
Start: 2023-08-30 — End: 2023-08-30
  Administered 2023-08-30: 140 mg via SUBCUTANEOUS

## 2023-08-30 NOTE — Progress Notes (Signed)
Evolocumab SOAJ 140 mgDose: 140 mg  :  Subcutaneous  :   Once Order ID: [id number]  Ordered Admin Dose: 140 mg  Last Admin: Today 08/30/23 at 0924 (Given)    Given : 08/30/23 : 140 mg : Right Lower Abdomen New Administration

## 2023-09-13 ENCOUNTER — Ambulatory Visit: Payer: Medicare HMO

## 2023-09-26 DIAGNOSIS — J4520 Mild intermittent asthma, uncomplicated: Secondary | ICD-10-CM | POA: Diagnosis not present

## 2023-09-26 DIAGNOSIS — Z23 Encounter for immunization: Secondary | ICD-10-CM | POA: Diagnosis not present

## 2023-09-26 DIAGNOSIS — E1122 Type 2 diabetes mellitus with diabetic chronic kidney disease: Secondary | ICD-10-CM | POA: Diagnosis not present

## 2023-09-26 DIAGNOSIS — I5032 Chronic diastolic (congestive) heart failure: Secondary | ICD-10-CM | POA: Diagnosis not present

## 2023-09-26 DIAGNOSIS — I110 Hypertensive heart disease with heart failure: Secondary | ICD-10-CM | POA: Diagnosis not present

## 2023-09-26 DIAGNOSIS — Z794 Long term (current) use of insulin: Secondary | ICD-10-CM | POA: Diagnosis not present

## 2023-09-26 DIAGNOSIS — E6609 Other obesity due to excess calories: Secondary | ICD-10-CM | POA: Diagnosis not present

## 2023-09-26 DIAGNOSIS — N1831 Chronic kidney disease, stage 3a: Secondary | ICD-10-CM | POA: Diagnosis not present

## 2023-09-26 DIAGNOSIS — E7800 Pure hypercholesterolemia, unspecified: Secondary | ICD-10-CM | POA: Diagnosis not present

## 2023-10-24 DIAGNOSIS — N1831 Chronic kidney disease, stage 3a: Secondary | ICD-10-CM | POA: Diagnosis not present

## 2023-10-24 DIAGNOSIS — I110 Hypertensive heart disease with heart failure: Secondary | ICD-10-CM | POA: Diagnosis not present

## 2023-10-24 DIAGNOSIS — E1122 Type 2 diabetes mellitus with diabetic chronic kidney disease: Secondary | ICD-10-CM | POA: Diagnosis not present

## 2023-10-24 DIAGNOSIS — I5032 Chronic diastolic (congestive) heart failure: Secondary | ICD-10-CM | POA: Diagnosis not present

## 2023-10-24 DIAGNOSIS — J4520 Mild intermittent asthma, uncomplicated: Secondary | ICD-10-CM | POA: Diagnosis not present

## 2023-10-24 DIAGNOSIS — Z794 Long term (current) use of insulin: Secondary | ICD-10-CM | POA: Diagnosis not present

## 2023-10-24 DIAGNOSIS — E7800 Pure hypercholesterolemia, unspecified: Secondary | ICD-10-CM | POA: Diagnosis not present

## 2023-10-24 DIAGNOSIS — E6609 Other obesity due to excess calories: Secondary | ICD-10-CM | POA: Diagnosis not present

## 2023-11-19 ENCOUNTER — Other Ambulatory Visit: Payer: Self-pay | Admitting: Internal Medicine

## 2023-12-15 ENCOUNTER — Other Ambulatory Visit: Payer: Self-pay | Admitting: Cardiology

## 2024-01-15 DIAGNOSIS — N1831 Chronic kidney disease, stage 3a: Secondary | ICD-10-CM | POA: Diagnosis not present

## 2024-01-26 DIAGNOSIS — I130 Hypertensive heart and chronic kidney disease with heart failure and stage 1 through stage 4 chronic kidney disease, or unspecified chronic kidney disease: Secondary | ICD-10-CM | POA: Diagnosis not present

## 2024-01-26 DIAGNOSIS — N2581 Secondary hyperparathyroidism of renal origin: Secondary | ICD-10-CM | POA: Diagnosis not present

## 2024-01-26 DIAGNOSIS — E1122 Type 2 diabetes mellitus with diabetic chronic kidney disease: Secondary | ICD-10-CM | POA: Diagnosis not present

## 2024-01-26 DIAGNOSIS — N1831 Chronic kidney disease, stage 3a: Secondary | ICD-10-CM | POA: Diagnosis not present

## 2024-01-26 DIAGNOSIS — N189 Chronic kidney disease, unspecified: Secondary | ICD-10-CM | POA: Diagnosis not present

## 2024-01-26 DIAGNOSIS — D631 Anemia in chronic kidney disease: Secondary | ICD-10-CM | POA: Diagnosis not present

## 2024-01-26 DIAGNOSIS — I509 Heart failure, unspecified: Secondary | ICD-10-CM | POA: Diagnosis not present

## 2024-01-26 DIAGNOSIS — I129 Hypertensive chronic kidney disease with stage 1 through stage 4 chronic kidney disease, or unspecified chronic kidney disease: Secondary | ICD-10-CM | POA: Diagnosis not present

## 2024-01-26 DIAGNOSIS — E876 Hypokalemia: Secondary | ICD-10-CM | POA: Diagnosis not present

## 2024-01-31 ENCOUNTER — Ambulatory Visit: Payer: Medicare HMO | Attending: Cardiology | Admitting: Cardiology

## 2024-01-31 ENCOUNTER — Encounter: Payer: Self-pay | Admitting: Cardiology

## 2024-01-31 VITALS — BP 100/68 | HR 66 | Resp 16 | Ht 61.0 in | Wt 172.6 lb

## 2024-01-31 DIAGNOSIS — I739 Peripheral vascular disease, unspecified: Secondary | ICD-10-CM

## 2024-01-31 DIAGNOSIS — E1165 Type 2 diabetes mellitus with hyperglycemia: Secondary | ICD-10-CM

## 2024-01-31 DIAGNOSIS — I2510 Atherosclerotic heart disease of native coronary artery without angina pectoris: Secondary | ICD-10-CM

## 2024-01-31 DIAGNOSIS — E782 Mixed hyperlipidemia: Secondary | ICD-10-CM

## 2024-01-31 DIAGNOSIS — N1831 Chronic kidney disease, stage 3a: Secondary | ICD-10-CM | POA: Diagnosis not present

## 2024-01-31 MED ORDER — CILOSTAZOL 100 MG PO TABS: 100.0000 mg | ORAL_TABLET | Freq: Two times a day (BID) | ORAL | 2 refills | Status: DC

## 2024-01-31 NOTE — Progress Notes (Signed)
  Cardiology Office Note:  .   Date:  01/31/2024  ID:  [place], DOB 09/05/1961, MRN [id number] PCP: Jackie Plum, MD  [facility] Providers Cardiologist:  Truett Mainland, MD PCP: Jackie Plum, MD  Chief Complaint  Patient presents with   Coronary artery disease involving native coronary artery of   Mixed hyperlipidemia   Follow-up      History of Present Illness: .    [name] is a 63 y.o. female with hypertension, type 2 diabetes mellitus, hyperlipidemia, coronary artery disease, PAD, nonischemic cardiomyopathy with now recovered EF, pulmonary hypertension   Patient is doing well without any chest pain or shortness of breath.  She does have calf claudication, but is not lifestyle-limiting.  She does not have any nonhealing wounds, ulcers.  Vitals:   01/31/24 0903  BP: 100/68  Pulse: 66  Resp: 16  SpO2: 95%     ROS:  Review of Systems  Cardiovascular:  Positive for claudication. Negative for chest pain, dyspnea on exertion, leg swelling, palpitations and syncope.     Studies Reviewed: Marland Kitchen         Independently interpreted 07/2023: HbA1C 6.5% Hb 11.1 Cr 1.5   Physical Exam:   Physical Exam Cardiovascular:     Pulses:          Dorsalis pedis pulses are 1+ on the right side and 1+ on the left side.       Posterior tibial pulses are 0 on the right side and 1+ on the left side.      VISIT DIAGNOSES:   ICD-10-CM   1. Coronary artery disease involving native coronary artery of native heart without angina pectoris  I25.10     2. PAD (peripheral artery disease) (HCC)  I73.9     3. Mixed hyperlipidemia  E78.2     4. Uncontrolled type 2 diabetes mellitus with hyperglycemia (HCC)  E11.65        ASSESSMENT AND PLAN: .    [name] is a 63 y.o. female with hypertension, type 2 diabetes mellitus, hyperlipidemia, coronary artery disease, PAD, CKD IIII-IV, nonischemic cardiomyopathy with now recovered EF, pulmonary  hypertension   Hypertension: Well-controlled.   PAD: No critical limb ischemia.  Abnormal ABI Rt 0.68, Lt 0.78 (01/2020). Not lifestyle limiting claudication, no CLI. Continue Aspirin 81 mg daily,  Increase Pletal to 100 mg bid. Continue lisinopril 10 mg daily. Continue aggressive risk factor modification, especially diabetes and lipid control. Encourage regular walking. If no improvement claudication symptoms, or development of CLI, will order lower extremity duplex ultrasound and refer to peripheral intervention specialist.   Mixed hyperlipidemia: LDL down to 20 on Repatha and Crestor (10/2021). Check lipid panel today.    Type 2 DM: A1c down to 7.2%.  Continue follow-up with PCP.   Coronary artery disease: No angina symptoms at this time.  Continue medical therapy.     Meds ordered this encounter  Medications   cilostazol (PLETAL) 100 MG tablet    Sig: Take 1 tablet (100 mg total) by mouth 2 (two) times daily.    Dispense:  180 tablet    Refill:  2     F/u in 6 months  Signed, Elder Negus, MD

## 2024-01-31 NOTE — Patient Instructions (Addendum)
Medication Instructions:  Increased Pletal from 50 mg to 100 mg take one tablet twice daily   *If you need a refill on your cardiac medications before your next appointment, please call your pharmacy*   Lab Work: Lipid Panel  If you have labs (blood work) drawn today and your tests are completely normal, you will receive your results only by: MyChart Message (if you have MyChart) OR A paper copy in the mail If you have any lab test that is abnormal or we need to change your treatment, we will call you to review the results.    Follow-Up: At [facility], you and your health needs are our priority.  As part of our continuing mission to provide you with exceptional heart care, we have created designated Provider Care Teams.  These Care Teams include your primary Cardiologist (physician) and Advanced Practice Providers (APPs -  Physician Assistants and Nurse Practitioners) who all work together to provide you with the care you need, when you need it.  Your next appointment:   6 month(s)  Provider:   Elder Negus, MD     Other Instructions  [facility] Contact for Alternative locations and appointment scheduling   [email or web address]   [email or web address]  [phone]

## 2024-02-01 ENCOUNTER — Telehealth: Payer: Self-pay | Admitting: *Deleted

## 2024-02-01 DIAGNOSIS — E7800 Pure hypercholesterolemia, unspecified: Secondary | ICD-10-CM

## 2024-02-01 DIAGNOSIS — E782 Mixed hyperlipidemia: Secondary | ICD-10-CM

## 2024-02-01 DIAGNOSIS — I2510 Atherosclerotic heart disease of native coronary artery without angina pectoris: Secondary | ICD-10-CM

## 2024-02-01 DIAGNOSIS — E1165 Type 2 diabetes mellitus with hyperglycemia: Secondary | ICD-10-CM

## 2024-02-01 LAB — LIPID PANEL
Chol/HDL Ratio: 3 {ratio} (ref 0.0–4.4)
Cholesterol, Total: 129 mg/dL (ref 100–199)
HDL: 43 mg/dL (ref >39)
LDL Chol Calc (NIH): 59 mg/dL (ref 0–99)
Triglycerides: 159 mg/dL — ABNORMAL HIGH (ref 0–149)
VLDL Cholesterol Cal: 27 mg/dL (ref 5–40)

## 2024-02-01 NOTE — Telephone Encounter (Signed)
-----   Message from [facility] sent at 02/01/2024  5:17 PM EST ----- Cholesterol still controlled, but higher than 2 years ago. We should repeat it in 6 months to make sure it is not increasing further.  Continue Repatha injections.  Thanks MJP

## 2024-02-01 NOTE — Telephone Encounter (Signed)
The patient has been notified of the result and verbalized understanding.  All questions (if any) were answered.  She is aware we will repeat lipids in 6 months at her next follow-up appt with our office at that time.  That will be in August, which scheduling will call to arrange closer to that time frame.  Will make note in recall to recheck lipids at that visit.    Will go ahead a place an order for lipids in 6 months and release this, for tracking reasons.  Pt verbalized understanding and agrees with this plan.

## 2024-02-01 NOTE — Progress Notes (Signed)
Cholesterol still controlled, but higher than 2 years ago. We should repeat it in 6 months to make sure it is not increasing further.  Continue Repatha injections.  Thanks MJP

## 2024-02-09 ENCOUNTER — Ambulatory Visit: Payer: Self-pay | Admitting: Cardiology

## 2024-02-19 ENCOUNTER — Encounter: Payer: Self-pay | Admitting: Internal Medicine

## 2024-02-19 ENCOUNTER — Ambulatory Visit (INDEPENDENT_AMBULATORY_CARE_PROVIDER_SITE_OTHER): Payer: Medicare HMO | Admitting: Internal Medicine

## 2024-02-19 VITALS — BP 112/74 | HR 100 | Ht 61.0 in | Wt 172.0 lb

## 2024-02-19 DIAGNOSIS — E1122 Type 2 diabetes mellitus with diabetic chronic kidney disease: Secondary | ICD-10-CM

## 2024-02-19 DIAGNOSIS — Z7984 Long term (current) use of oral hypoglycemic drugs: Secondary | ICD-10-CM

## 2024-02-19 DIAGNOSIS — E1142 Type 2 diabetes mellitus with diabetic polyneuropathy: Secondary | ICD-10-CM | POA: Diagnosis not present

## 2024-02-19 DIAGNOSIS — E1159 Type 2 diabetes mellitus with other circulatory complications: Secondary | ICD-10-CM | POA: Diagnosis not present

## 2024-02-19 DIAGNOSIS — Z7985 Long-term (current) use of injectable non-insulin antidiabetic drugs: Secondary | ICD-10-CM | POA: Diagnosis not present

## 2024-02-19 DIAGNOSIS — Z794 Long term (current) use of insulin: Secondary | ICD-10-CM

## 2024-02-19 DIAGNOSIS — N184 Chronic kidney disease, stage 4 (severe): Secondary | ICD-10-CM | POA: Diagnosis not present

## 2024-02-19 LAB — POCT GLYCOSYLATED HEMOGLOBIN (HGB A1C): Hemoglobin A1C: 7.9 % — AB (ref 4.0–5.6)

## 2024-02-19 MED ORDER — SEMAGLUTIDE (2 MG/DOSE) 8 MG/3ML ~~LOC~~ SOPN: 2.0000 mg | PEN_INJECTOR | SUBCUTANEOUS | 3 refills | Status: DC

## 2024-02-19 MED ORDER — INSULIN PEN NEEDLE 32G X 4 MM MISC: 1.0000 | Freq: Every day | 3 refills | Status: DC

## 2024-02-19 MED ORDER — GLIPIZIDE 5 MG PO TABS: 5.0000 mg | ORAL_TABLET | Freq: Every day | ORAL | 3 refills | Status: DC

## 2024-02-19 MED ORDER — TRESIBA FLEXTOUCH 100 UNIT/ML ~~LOC~~ SOPN: 12.0000 [IU] | PEN_INJECTOR | Freq: Every day | SUBCUTANEOUS | 3 refills | Status: DC

## 2024-02-19 NOTE — Progress Notes (Signed)
 Name: [name]  MRN/ DOB: [id number], 1961/04/25   Age/ Sex: 63 y.o., female    PCP: Jackie Plum, MD   Reason for Endocrinology Evaluation: Type 2 Diabetes Mellitus     Date of Initial Endocrinology Visit: 03/07/2022    PATIENT IDENTIFIER: [name] is a 63 y.o. female with a past medical history of T2DM, CKD, CHF, HTN , CAD and dyslipidemia . The patient presented for initial endocrinology clinic visit on 03/07/2022 for consultative assistance with her diabetes management.    HPI: Ms. Harbeck was    Diagnosed with DM ~ 9 yrs  Hemoglobin A1c has ranged from 11.8% in 12/2020, peaking at 12.2% in 06/2021.   Follows with [facility]   On her initial visit her A1c was 11.4%, she was already on metformin, and glipizide, she declined insulin.  We started her on Farxiga   Farxiga and Metformin discontinued due to low GFR 11/2022  Started  basal insulin 01/2023  Started Ozempic 03/2023  Discontinued glipizide 08/2023 with an A1c of 6.5%  We started glipizide 02/2024 with an A1c of 7.9%  SUBJECTIVE:   During the last visit (08/22/2023): A1c 6.5%       Today (02/19/24): [name] is here for follow-up on diabetes management.she checks her blood sugars 2x  daily . The patient has  had hypoglycemic episodes since the last clinic visit. She is symptomatic with these episodes.   She continues to follow-up with cardiology last visit was 2/20254 for mixed hyperlipidemia, PAD and CAD   Denies nausea or vomiting  Denies constipation or diarrhea     HOME DIABETES REGIMEN: Ozempic 1 mg weekly Tresiba 14 units daily     Statin: yes ACE-I/ARB: yes    METER DOWNLOAD SUMMARY: unable to download  96-271 mg/dL    DIABETIC COMPLICATIONS: Microvascular complications:  CKD III , neuropathy  Denies: retinopathy  Last eye exam: Completed 02/2022  Macrovascular complications:  CHF, CVA , PAD   PAST HISTORY: Past Medical History:  Past Medical  History:  Diagnosis Date   Asthma    prn - occasional uses inhaler   Chronic systolic CHF (congestive heart failure) (HCC)    Diabetes mellitus without complication (HCC)    borderline, being monitored, no meds   Hypercholesterolemia    Hypertension    Microcytic anemia    Nonischemic cardiomyopathy (HCC)    a. 2005 Cath: nonobstructive dzs;  b. 04/2013 Echo: EF 3035%, diff HK, restrictive physiology, mildly dil LA, PASP .   SVD (spontaneous vaginal delivery)    x 3   Past Surgical History:  Past Surgical History:  Procedure Laterality Date   BILATERAL SALPINGECTOMY Bilateral 01/15/2015   Procedure: BILATERAL SALPINGECTOMY;  Surgeon: Serita Kyle, MD;  Location: [facility];  Service: Gynecology;  Laterality: Bilateral;   COLONOSCOPY     CORONARY ANGIOPLASTY  12/20/2003   LEFT AND RIGHT HEART CATHETERIZATION WITH CORONARY ANGIOGRAM N/A 07/05/2013   Procedure: LEFT AND RIGHT HEART CATHETERIZATION WITH CORONARY ANGIOGRAM;  Surgeon: Laurey Morale, MD;  Location: [facility] CATH LAB;  Service: Cardiovascular;  Laterality: N/A;   RIGHT HEART CATHETERIZATION N/A 06/13/2013   Procedure: RIGHT HEART CATH;  Surgeon: Wendall Stade, MD;  Location: [facility] C[facility] LAB;  Service: Cardiovascular;  Laterality: N/A;   ROBOTIC ASSISTED TOTAL HYSTERECTOMY N/A 01/15/2015   Procedure: ROBOTIC ASSISTED TOTAL HYSTERECTOMY;  Surgeon: Serita Kyle, MD;  Location: [facility];  Service: Gynecology;  Laterality: N/A;   TUBAL LIGATION     WISDOM  TOOTH EXTRACTION      Social History:  reports that she has never smoked. She has never used smokeless tobacco. She reports that she does not drink alcohol and does not use drugs. Family History:  Family History  Problem Relation Age of Onset   Hypertension Sister    Hyperlipidemia Sister    Hypertension Sister    Hyperlipidemia Sister    Heart disease Brother        History of heart transplant   Hypertension Brother    Heart disease Brother        Coronary  artery disease   Hypertension Brother    Heart disease Brother    Hypertension Brother    Heart disease Brother    Hypertension Brother    Colon cancer Neg Hx    Colon polyps Neg Hx    Esophageal cancer Neg Hx    Rectal cancer Neg Hx    Stomach cancer Neg Hx      HOME MEDICATIONS: Allergies as of 02/19/2024   No Known Allergies      Medication List        Accurate as of February 19, 2024  8:41 AM. If you have any questions, ask your nurse or doctor.          Accu-Chek Aviva Plus test strip Generic drug: glucose blood 1 each 3 (three) times daily.   Accu-Chek Softclix Lancets lancets 1 each 3 (three) times daily.   acetaminophen 325 MG tablet Commonly known as: TYLENOL Take 325-650 mg by mouth every 6 (six) hours as needed for mild pain (depends on pain).   albuterol 108 (90 Base) MCG/ACT inhaler Commonly known as: VENTOLIN HFA Inhale 2 puffs into the lungs every 4 (four) hours as needed for wheezing or shortness of breath.   aspirin EC 81 MG tablet Take 1 tablet (81 mg total) by mouth daily. Swallow whole.   carvedilol 12.5 MG tablet Commonly known as: COREG Take 3.125 mg by mouth 2 (two) times daily.   cilostazol 100 MG tablet Commonly known as: PLETAL Take 1 tablet (100 mg total) by mouth 2 (two) times daily.   ezetimibe 10 MG tablet Commonly known as: ZETIA Take 10 mg by mouth daily.   ferrous sulfate 325 (65 FE) MG tablet Take 325 mg by mouth daily with breakfast.   fluorometholone 0.1 % ophthalmic suspension Commonly known as: FML SMARTSIG:In Eye(s)   furosemide 40 MG tablet Commonly known as: LASIX Take 1 tablet (40 mg total) by mouth daily.   gabapentin 300 MG capsule Commonly known as: NEURONTIN Take 300 mg by mouth at bedtime.   Insulin Pen Needle 32G X 4 MM Misc 1 Device by Does not apply route daily in the afternoon.   lisinopril 10 MG tablet Commonly known as: ZESTRIL Take 10 mg by mouth daily.   omega-3 acid ethyl esters 1 g  capsule Commonly known as: LOVAZA Take 2 capsules by mouth 2 (two) times daily.   Potassium Chloride ER 20 MEQ Tbcr Take 1 tablet by mouth daily.   Repatha SureClick 140 MG/ML Soaj Generic drug: Evolocumab INJECT 1 ML INTO THE SKIN ONCE EVERY 14 DAYS   Restasis 0.05 % ophthalmic emulsion Generic drug: cycloSPORINE 1 drop 2 (two) times daily.   rosuvastatin 40 MG tablet Commonly known as: CRESTOR Take 1 tablet (40 mg total) by mouth at bedtime.   Semaglutide (1 MG/DOSE) 4 MG/3ML Sopn Inject 1 mg as directed once a week.   spironolactone 25 MG tablet  Commonly known as: ALDACTONE Take 25 mg by mouth daily.   Evaristo Bury FlexTouch 100 UNIT/ML FlexTouch Pen Generic drug: insulin degludec Inject 14 Units into the skin daily.   VITAMIN D PO Take 125 mcg by mouth in the morning and at bedtime.         ALLERGIES: No Known Allergies       OBJECTIVE:   VITAL SIGNS: BP 112/74 (BP Location: Left Arm, Patient Position: Sitting, Cuff Size: Normal)   Pulse 100   Ht 5\' 1"  (1.549 m)   Wt 172 lb (78 kg)   LMP 01/01/2015 (Approximate)   SpO2 96%   BMI 32.50 kg/m    PHYSICAL EXAM:  General: Pt appears well and is in NAD  Neck: General: Supple without adenopathy or carotid bruits. Thyroid: Thyroid size normal.  No goiter or nodules appreciated.   Lungs: Clear with good BS bilat with no rales, rhonchi, or wheezes  Heart: RRR   Extremities:  Lower extremities - No pretibial edema. No lesions.  Neuro: MS is good with appropriate affect, pt is alert and Ox3    DM foot exam: 02/19/2024  The skin of the feet is intact without sores or ulcerations. The pedal pulses are 1+ on right and 1+ on left. The sensation is intact to a screening 5.07, 10 gram monofilament bilaterally    DATA REVIEWED:  Lab Results  Component Value Date   HGBA1C 7.9 (A) 02/19/2024   HGBA1C 6.5 (A) 08/22/2023   HGBA1C 7.2 (A) 04/18/2023      05/30/2023 BUN 13 Cr. 1.75 GFR 15.390   Old  records , labs and images have been reviewed.    ASSESSMENT / PLAN / RECOMMENDATIONS:   1) Type 2 Diabetes Mellitus, Sub-Optimally Controlled , With neuropathic, CKD IV and macrovascular  complications - Most recent A1c of 7.9%. Goal A1c < 7.0 %.    Metformin and SGLT2 inhibitors had to be discontinued with a GFR of  15 by 11/2022 Unfortunately, her A1c has trended from 6.5% to 7.9% with discontinuation of glipizide.  She still has the glipizide at home and we have opted to restart it with the first meal of the day I will also increase Ozempic I will decrease insulin as below to prevent hypoglycemia  MEDICATIONS: Increase Ozempic 2 mg weekly  Restart glipizide 5 mg daily Decrease Tresiba 12 units daily     EDUCATION / INSTRUCTIONS: BG monitoring instructions: Patient is instructed to check her blood sugars 1 times a day, fasting . Call [facility] if: BG persistently < 70  I reviewed the Rule of 15 for the treatment of hypoglycemia in detail with the patient. Literature supplied.   2) Diabetic complications:  Eye: Does not have known diabetic retinopathy.  Neuro/ Feet: Does  have known diabetic peripheral neuropathy. Renal: Patient does  have known baseline CKD. She is  on an ACEI/ARB at present.Follows with [facility]   3) CAD/PVD/Dyslipidemia :   - Per cardiology   Follow-up in 4 months    Signed electronically by: Lyndle Herrlich, MD  [facility] Endocrinology  [facility] [address] [place] [place], [place] [address] Phone: [phone] FAX: [phone]   CC: Jackie Plum, MD [address] [place] [address] Phone: [phone]  Fax: [phone]    Return to Endocrinology clinic as below: Future Appointments  Date Time Provider Department Center  02/19/2024  8:50 AM Sury Wentworth, Konrad Dolores, MD [id number] None

## 2024-02-19 NOTE — Patient Instructions (Signed)
 Restart Glipizide 5 mg , 1 tablet before first meal of the day  Increase Ozempic 2 mg weekly  Decrease Tresiba 12 units daily      HOW TO TREAT LOW BLOOD SUGARS (Blood sugar LESS THAN 70 MG/DL) Please follow the RULE OF 15 for the treatment of hypoglycemia treatment (when your (blood sugars are less than 70 mg/dL)   STEP 1: Take 15 grams of carbohydrates when your blood sugar is low, which includes:  3-4 GLUCOSE TABS  OR 3-4 OZ OF JUICE OR REGULAR SODA OR ONE TUBE OF GLUCOSE GEL    STEP 2: RECHECK blood sugar in 15 MINUTES STEP 3: If your blood sugar is still low at the 15 minute recheck --> then, go back to STEP 1 and treat AGAIN with another 15 grams of carbohydrates.

## 2024-03-06 DIAGNOSIS — H35033 Hypertensive retinopathy, bilateral: Secondary | ICD-10-CM | POA: Diagnosis not present

## 2024-03-06 DIAGNOSIS — E119 Type 2 diabetes mellitus without complications: Secondary | ICD-10-CM | POA: Diagnosis not present

## 2024-03-06 DIAGNOSIS — H2513 Age-related nuclear cataract, bilateral: Secondary | ICD-10-CM | POA: Diagnosis not present

## 2024-03-06 DIAGNOSIS — H18603 Keratoconus, unspecified, bilateral: Secondary | ICD-10-CM | POA: Diagnosis not present

## 2024-03-07 ENCOUNTER — Other Ambulatory Visit: Payer: Self-pay | Admitting: Internal Medicine

## 2024-03-07 ENCOUNTER — Other Ambulatory Visit: Payer: Self-pay | Admitting: Cardiology

## 2024-03-07 DIAGNOSIS — I2510 Atherosclerotic heart disease of native coronary artery without angina pectoris: Secondary | ICD-10-CM

## 2024-03-07 DIAGNOSIS — E782 Mixed hyperlipidemia: Secondary | ICD-10-CM

## 2024-03-08 DIAGNOSIS — Z1231 Encounter for screening mammogram for malignant neoplasm of breast: Secondary | ICD-10-CM | POA: Diagnosis not present

## 2024-03-12 DIAGNOSIS — N1831 Chronic kidney disease, stage 3a: Secondary | ICD-10-CM | POA: Diagnosis not present

## 2024-03-12 DIAGNOSIS — Z0001 Encounter for general adult medical examination with abnormal findings: Secondary | ICD-10-CM | POA: Diagnosis not present

## 2024-03-12 DIAGNOSIS — I110 Hypertensive heart disease with heart failure: Secondary | ICD-10-CM | POA: Diagnosis not present

## 2024-03-12 DIAGNOSIS — E7800 Pure hypercholesterolemia, unspecified: Secondary | ICD-10-CM | POA: Diagnosis not present

## 2024-03-12 DIAGNOSIS — J4520 Mild intermittent asthma, uncomplicated: Secondary | ICD-10-CM | POA: Diagnosis not present

## 2024-03-12 DIAGNOSIS — E1122 Type 2 diabetes mellitus with diabetic chronic kidney disease: Secondary | ICD-10-CM | POA: Diagnosis not present

## 2024-03-12 DIAGNOSIS — Z794 Long term (current) use of insulin: Secondary | ICD-10-CM | POA: Diagnosis not present

## 2024-03-12 DIAGNOSIS — I5032 Chronic diastolic (congestive) heart failure: Secondary | ICD-10-CM | POA: Diagnosis not present

## 2024-03-12 DIAGNOSIS — E6609 Other obesity due to excess calories: Secondary | ICD-10-CM | POA: Diagnosis not present

## 2024-03-13 DIAGNOSIS — Z0001 Encounter for general adult medical examination with abnormal findings: Secondary | ICD-10-CM | POA: Diagnosis not present

## 2024-03-13 DIAGNOSIS — J4520 Mild intermittent asthma, uncomplicated: Secondary | ICD-10-CM | POA: Diagnosis not present

## 2024-03-13 DIAGNOSIS — Z794 Long term (current) use of insulin: Secondary | ICD-10-CM | POA: Diagnosis not present

## 2024-03-13 DIAGNOSIS — E1122 Type 2 diabetes mellitus with diabetic chronic kidney disease: Secondary | ICD-10-CM | POA: Diagnosis not present

## 2024-03-13 DIAGNOSIS — I110 Hypertensive heart disease with heart failure: Secondary | ICD-10-CM | POA: Diagnosis not present

## 2024-03-13 DIAGNOSIS — E6609 Other obesity due to excess calories: Secondary | ICD-10-CM | POA: Diagnosis not present

## 2024-03-13 DIAGNOSIS — I5032 Chronic diastolic (congestive) heart failure: Secondary | ICD-10-CM | POA: Diagnosis not present

## 2024-03-13 DIAGNOSIS — E7800 Pure hypercholesterolemia, unspecified: Secondary | ICD-10-CM | POA: Diagnosis not present

## 2024-03-13 DIAGNOSIS — N1831 Chronic kidney disease, stage 3a: Secondary | ICD-10-CM | POA: Diagnosis not present

## 2024-04-16 DIAGNOSIS — E7800 Pure hypercholesterolemia, unspecified: Secondary | ICD-10-CM | POA: Diagnosis not present

## 2024-04-16 DIAGNOSIS — N1831 Chronic kidney disease, stage 3a: Secondary | ICD-10-CM | POA: Diagnosis not present

## 2024-04-16 DIAGNOSIS — I110 Hypertensive heart disease with heart failure: Secondary | ICD-10-CM | POA: Diagnosis not present

## 2024-04-16 DIAGNOSIS — E1122 Type 2 diabetes mellitus with diabetic chronic kidney disease: Secondary | ICD-10-CM | POA: Diagnosis not present

## 2024-04-16 DIAGNOSIS — Z794 Long term (current) use of insulin: Secondary | ICD-10-CM | POA: Diagnosis not present

## 2024-04-16 DIAGNOSIS — J4520 Mild intermittent asthma, uncomplicated: Secondary | ICD-10-CM | POA: Diagnosis not present

## 2024-04-16 DIAGNOSIS — E6609 Other obesity due to excess calories: Secondary | ICD-10-CM | POA: Diagnosis not present

## 2024-04-16 DIAGNOSIS — I5032 Chronic diastolic (congestive) heart failure: Secondary | ICD-10-CM | POA: Diagnosis not present

## 2024-05-26 ENCOUNTER — Other Ambulatory Visit: Payer: Self-pay | Admitting: Cardiology

## 2024-05-26 DIAGNOSIS — I2510 Atherosclerotic heart disease of native coronary artery without angina pectoris: Secondary | ICD-10-CM

## 2024-05-26 DIAGNOSIS — E782 Mixed hyperlipidemia: Secondary | ICD-10-CM

## 2024-06-18 DIAGNOSIS — E6609 Other obesity due to excess calories: Secondary | ICD-10-CM | POA: Diagnosis not present

## 2024-06-18 DIAGNOSIS — Z794 Long term (current) use of insulin: Secondary | ICD-10-CM | POA: Diagnosis not present

## 2024-06-18 DIAGNOSIS — N1831 Chronic kidney disease, stage 3a: Secondary | ICD-10-CM | POA: Diagnosis not present

## 2024-06-18 DIAGNOSIS — I110 Hypertensive heart disease with heart failure: Secondary | ICD-10-CM | POA: Diagnosis not present

## 2024-06-18 DIAGNOSIS — E1122 Type 2 diabetes mellitus with diabetic chronic kidney disease: Secondary | ICD-10-CM | POA: Diagnosis not present

## 2024-06-18 DIAGNOSIS — J4520 Mild intermittent asthma, uncomplicated: Secondary | ICD-10-CM | POA: Diagnosis not present

## 2024-06-18 DIAGNOSIS — Z0001 Encounter for general adult medical examination with abnormal findings: Secondary | ICD-10-CM | POA: Diagnosis not present

## 2024-06-18 DIAGNOSIS — I5032 Chronic diastolic (congestive) heart failure: Secondary | ICD-10-CM | POA: Diagnosis not present

## 2024-06-18 DIAGNOSIS — E7800 Pure hypercholesterolemia, unspecified: Secondary | ICD-10-CM | POA: Diagnosis not present

## 2024-06-24 ENCOUNTER — Encounter: Payer: Self-pay | Admitting: Internal Medicine

## 2024-06-24 ENCOUNTER — Ambulatory Visit (INDEPENDENT_AMBULATORY_CARE_PROVIDER_SITE_OTHER): Admitting: Internal Medicine

## 2024-06-24 VITALS — BP 124/78 | HR 90 | Ht 61.0 in | Wt 176.0 lb

## 2024-06-24 DIAGNOSIS — N1831 Chronic kidney disease, stage 3a: Secondary | ICD-10-CM

## 2024-06-24 DIAGNOSIS — Z7985 Long-term (current) use of injectable non-insulin antidiabetic drugs: Secondary | ICD-10-CM

## 2024-06-24 DIAGNOSIS — Z794 Long term (current) use of insulin: Secondary | ICD-10-CM

## 2024-06-24 DIAGNOSIS — E1142 Type 2 diabetes mellitus with diabetic polyneuropathy: Secondary | ICD-10-CM | POA: Diagnosis not present

## 2024-06-24 DIAGNOSIS — Z7984 Long term (current) use of oral hypoglycemic drugs: Secondary | ICD-10-CM

## 2024-06-24 DIAGNOSIS — E1122 Type 2 diabetes mellitus with diabetic chronic kidney disease: Secondary | ICD-10-CM

## 2024-06-24 LAB — BASIC METABOLIC PANEL WITH GFR
BUN/Creatinine Ratio: 10 (calc) (ref 6–22)
BUN: 15 mg/dL (ref 7–25)
CO2: 26 mmol/L (ref 20–32)
Calcium: 9.6 mg/dL (ref 8.6–10.4)
Chloride: 105 mmol/L (ref 98–110)
Creat: 1.5 mg/dL — ABNORMAL HIGH (ref 0.50–1.05)
Glucose, Bld: 129 mg/dL — ABNORMAL HIGH (ref 65–99)
Potassium: 4.2 mmol/L (ref 3.5–5.3)
Sodium: 141 mmol/L (ref 135–146)
eGFR: 39 mL/min/1.73m2 — ABNORMAL LOW (ref >=60)

## 2024-06-24 LAB — POCT GLYCOSYLATED HEMOGLOBIN (HGB A1C): Hemoglobin A1C: 6.9 % — AB (ref 4.0–5.6)

## 2024-06-24 NOTE — Progress Notes (Unsigned)
 Name: [name]  MRN/ DOB: [id number], [birth year]   Age/ Sex: 63 y.o., female    PCP: Teresa Bare, MD   Reason for Endocrinology Evaluation: Type 2 Diabetes Mellitus     Date of Initial Endocrinology Visit: 03/07/2022    PATIENT IDENTIFIER: [name] is a 63 y.o. female with a past medical history of T2DM, CKD, CHF, HTN , CAD and dyslipidemia . The patient presented for initial endocrinology clinic visit on 03/07/2022 for consultative assistance with her diabetes management.    HPI: [name] was    Diagnosed with DM ~ 9 yrs  Hemoglobin A1c has ranged from 11.8% in 12/2020, peaking at 12.2% in 06/2021.   Follows with [facility]   On her initial visit her A1c was 11.4%, she was already on metformin , and glipizide , she declined insulin .  We started her on Farxiga    Farxiga  and Metformin  discontinued due to low GFR 11/2022  Started  basal insulin  01/2023  Started Ozempic  03/2023  Discontinued glipizide  08/2023 with an A1c of 6.5%  We started glipizide  02/2024 with an A1c of 7.9%  SUBJECTIVE:   During the last visit (02/19/2024): A1c 7.9%       Today (06/24/24): [name] is here for follow-up on diabetes management.she checks her blood sugars 2x  daily . The patient has  had hypoglycemic episodes since the last clinic visit. She is symptomatic with these episodes.   She continues to follow-up with cardiology for mixed hyperlipidemia, PAD and CAD   Denies nausea or vomiting  Denies constipation or diarrhea     HOME DIABETES REGIMEN: Ozempic  2 mg weekly Glipizide  5 mg daily Tresiba  12 units daily     Statin: yes ACE-I/ARB: yes    METER DOWNLOAD SUMMARY: 6/23-06/23/2024 Fingerstick Blood Glucose Tests = 5 Average Number Tests/Day = 0.2 Overall Mean FS Glucose = 157  BG Ranges: Low = 124 High = 181  BG Target % Results: % In target = 80 % Over target = 20 % Under target = 0  Hypoglycemic Events/30 Days: BG < 50 = 0 Episodes  of symptomatic severe hypoglycemia = 0    DIABETIC COMPLICATIONS: Microvascular complications:  CKD III , neuropathy  Denies: retinopathy  Last eye exam: Completed 03/06/2024  Macrovascular complications:  CHF, CVA , PAD   PAST HISTORY: Past Medical History:  Past Medical History:  Diagnosis Date   Asthma    prn - occasional uses inhaler   Chronic systolic CHF (congestive heart failure) (HCC)    Diabetes mellitus without complication (HCC)    borderline, being monitored, no meds   Hypercholesterolemia    Hypertension    Microcytic anemia    Nonischemic cardiomyopathy (HCC)    a. 2005 Cath: nonobstructive dzs;  b. 04/2013 Echo: EF 3035%, diff HK, restrictive physiology, mildly dil LA, PASP .   SVD (spontaneous vaginal delivery)    x 3   Past Surgical History:  Past Surgical History:  Procedure Laterality Date   BILATERAL SALPINGECTOMY Bilateral 01/15/2015   Procedure: BILATERAL SALPINGECTOMY;  Surgeon: Teresa DELENA Carder, MD;  Location: [facility];  Service: Gynecology;  Laterality: Bilateral;   COLONOSCOPY     CORONARY ANGIOPLASTY  12/20/2003   LEFT AND RIGHT HEART CATHETERIZATION WITH CORONARY ANGIOGRAM N/A 07/05/2013   Procedure: LEFT AND RIGHT HEART CATHETERIZATION WITH CORONARY ANGIOGRAM;  Surgeon: Teresa GORMAN Shuck, MD;  Location: [facility] CATH LAB;  Service: Cardiovascular;  Laterality: N/A;   RIGHT HEART CATHETERIZATION N/A 06/13/2013   Procedure: RIGHT HEART CATH;  Surgeon: Teresa JAYSON Emmer, MD;  Location: [name] CATH LAB;  Service: Cardiovascular;  Laterality: N/A;   ROBOTIC ASSISTED TOTAL HYSTERECTOMY N/A 01/15/2015   Procedure: ROBOTIC ASSISTED TOTAL HYSTERECTOMY;  Surgeon: Teresa DELENA Carder, MD;  Location: [facility];  Service: Gynecology;  Laterality: N/A;   TUBAL LIGATION     WISDOM TOOTH EXTRACTION      Social History:  reports that she has never smoked. She has never used smokeless tobacco. She reports that she does not drink alcohol  and does not use drugs. Family  History:  Family History  Problem Relation Age of Onset   Hypertension Sister    Hyperlipidemia Sister    Hypertension Sister    Hyperlipidemia Sister    Heart disease Brother        History of heart transplant   Hypertension Brother    Heart disease Brother        Coronary artery disease   Hypertension Brother    Heart disease Brother    Hypertension Brother    Heart disease Brother    Hypertension Brother    Colon cancer Neg Hx    Colon polyps Neg Hx    Esophageal cancer Neg Hx    Rectal cancer Neg Hx    Stomach cancer Neg Hx      HOME MEDICATIONS: Allergies as of 06/24/2024   No Known Allergies      Medication List        Accurate as of June 24, 2024  8:10 AM. If you have any questions, ask your nurse or doctor.          Accu-Chek Aviva Plus test strip Generic drug: glucose blood 1 each 3 (three) times daily.   Accu-Chek Softclix Lancets lancets 1 each 3 (three) times daily.   acetaminophen  325 MG tablet Commonly known as: TYLENOL  Take 325-650 mg by mouth every 6 (six) hours as needed for mild pain (depends on pain).   albuterol  108 (90 Base) MCG/ACT inhaler Commonly known as: VENTOLIN  HFA Inhale 2 puffs into the lungs every 4 (four) hours as needed for wheezing or shortness of breath.   aspirin  EC 81 MG tablet Take 1 tablet (81 mg total) by mouth daily. Swallow whole.   carvedilol  12.5 MG tablet Commonly known as: COREG  Take 3.125 mg by mouth 2 (two) times daily.   cilostazol  100 MG tablet Commonly known as: PLETAL  Take 1 tablet (100 mg total) by mouth 2 (two) times daily.   ezetimibe 10 MG tablet Commonly known as: ZETIA Take 10 mg by mouth daily.   ferrous sulfate  325 (65 FE) MG tablet Take 325 mg by mouth daily with breakfast.   fluorometholone 0.1 % ophthalmic suspension Commonly known as: FML SMARTSIG:In Eye(s)   furosemide  40 MG tablet Commonly known as: LASIX  Take 1 tablet (40 mg total) by mouth daily.   gabapentin 300 MG  capsule Commonly known as: NEURONTIN Take 300 mg by mouth at bedtime.   glipiZIDE  5 MG tablet Commonly known as: GLUCOTROL  Take 1 tablet (5 mg total) by mouth daily before breakfast.   Insulin  Pen Needle 32G X 4 MM Misc 1 Device by Does not apply route daily in the afternoon.   lisinopril  10 MG tablet Commonly known as: ZESTRIL  Take 10 mg by mouth daily.   omega-3 acid ethyl esters 1 g capsule Commonly known as: LOVAZA Take 2 capsules by mouth 2 (two) times daily.   Potassium Chloride  ER 20 MEQ Tbcr Take 1 tablet by mouth daily.  Repatha  SureClick 140 MG/ML Soaj Generic drug: Evolocumab  INJECT 1 ML (140 MG) INTO THE SKIN ONCE EVERY 14 DAYS   Restasis 0.05 % ophthalmic emulsion Generic drug: cycloSPORINE 1 drop 2 (two) times daily.   rosuvastatin  40 MG tablet Commonly known as: CRESTOR  Take 1 tablet (40 mg total) by mouth at bedtime.   Semaglutide  (2 MG/DOSE) 8 MG/3ML Sopn Inject 2 mg as directed once a week.   spironolactone  25 MG tablet Commonly known as: ALDACTONE  Take 25 mg by mouth daily.   Tresiba  FlexTouch 100 UNIT/ML FlexTouch Pen Generic drug: insulin  degludec Inject 12 Units into the skin daily. What changed: how much to take   VITAMIN D PO Take 125 mcg by mouth in the morning and at bedtime.         ALLERGIES: No Known Allergies       OBJECTIVE:   VITAL SIGNS: BP 124/78 (BP Location: Left Arm, Patient Position: Sitting, Cuff Size: Normal)   Pulse 90   Ht 5' 1 (1.549 m)   Wt 176 lb (79.8 kg)   LMP 01/01/2015 (Approximate)   SpO2 99%   BMI 33.25 kg/m     Filed Weights   06/24/24 0800  Weight: 176 lb (79.8 kg)     PHYSICAL EXAM:  General: Pt appears well and is in NAD  Lungs: Clear with good BS bilat   Heart: RRR   Extremities:  Lower extremities - No pretibial edema  Neuro: MS is good with appropriate affect, pt is alert and Ox3    DM foot exam: 02/19/2024  The skin of the feet is intact without sores or  ulcerations. The pedal pulses are 1+ on right and 1+ on left. The sensation is intact to a screening 5.07, 10 gram monofilament bilaterally    DATA REVIEWED:  Lab Results  Component Value Date   HGBA1C 6.9 (A) 06/24/2024   HGBA1C 7.9 (A) 02/19/2024   HGBA1C 6.5 (A) 08/22/2023     Latest Reference Range & Units 06/24/24 08:23  Sodium 135 - 146 mmol/L 141  Potassium 3.5 - 5.3 mmol/L 4.2  Chloride 98 - 110 mmol/L 105  CO2 20 - 32 mmol/L 26  Glucose 65 - 99 mg/dL 870 (H)  BUN 7 - 25 mg/dL 15  Creatinine 9.49 - 8.94 mg/dL 8.49 (H)  Calcium  8.6 - 10.4 mg/dL 9.6  BUN/Creatinine Ratio 6 - 22 (calc) 10  eGFR > OR = 60 mL/min/1.55m2 39 (L)    Latest Reference Range & Units 06/24/24 08:34  Microalb, Ur mg/dL 8.8  MICROALB/CREAT RATIO <30 mg/g creat 50 (H)  Creatinine, Urine 20 - 275 mg/dL 824      Latest Reference Range & Units 01/31/24 09:51  Total CHOL/HDL Ratio 0.0 - 4.4 ratio 3.0  Cholesterol, Total 100 - 199 mg/dL 870  HDL Cholesterol >60 mg/dL 43  Triglycerides 0 - 850 mg/dL 840 (H)  VLDL Cholesterol Cal 5 - 40 mg/dL 27  LDL Chol Calc (NIH) 0 - 99 mg/dL 59     Old records , labs and images have been reviewed.    ASSESSMENT / PLAN / RECOMMENDATIONS:   1) Type 2 Diabetes Mellitus, Optimally Controlled , With neuropathic, CKD IV and macrovascular  complications - Most recent A1c of 6.9 %. Goal A1c < 7.0 %.    Metformin  and SGLT2 inhibitors had to be discontinued with a GFR of  15 by 11/2022 A1c is optimized No change at this time  MEDICATIONS: Continue Ozempic  2 mg weekly  Continue  glipizide  5 mg daily Continue Tresiba  12 units daily     EDUCATION / INSTRUCTIONS: BG monitoring instructions: Patient is instructed to check her blood sugars 1 times a day, fasting . Call [facility] if: BG persistently < 70  I reviewed the Rule of 15 for the treatment of hypoglycemia in detail with the patient. Literature supplied.   2) Diabetic complications:   Eye: Does not have known diabetic retinopathy.  Neuro/ Feet: Does  have known diabetic peripheral neuropathy. Renal: Patient does  have known baseline CKD. She is  on an ACEI/ARB at present.Follows with [facility]   3) CAD/PVD/Dyslipidemia :   - Per cardiology     4) Microalbuminuria :  - MA/CR ratio elevated - Patient on lisinopril  - Will continue to monitor  Follow-up in 4 months    Signed electronically by: Stefano Redgie Butts, MD  [name] Endocrinology  [name] [address] [place] [place], [place] [address] Phone: [phone] FAX: [phone]   CC: Teresa Bare, MD [address] [place] [address] Phone: [phone]  Fax: [phone]    Return to Endocrinology clinic as below: No future appointments.

## 2024-06-24 NOTE — Patient Instructions (Signed)
 Continue  Glipizide  5 mg , 1 tablet before first meal of the day  Continue  Ozempic  2 mg weekly  Continue  Tresiba  12 units daily      HOW TO TREAT LOW BLOOD SUGARS (Blood sugar LESS THAN 70 MG/DL) Please follow the RULE OF 15 for the treatment of hypoglycemia treatment (when your (blood sugars are less than 70 mg/dL)   STEP 1: Take 15 grams of carbohydrates when your blood sugar is low, which includes:  3-4 GLUCOSE TABS  OR 3-4 OZ OF JUICE OR REGULAR SODA OR ONE TUBE OF GLUCOSE GEL    STEP 2: RECHECK blood sugar in 15 MINUTES STEP 3: If your blood sugar is still low at the 15 minute recheck --> then, go back to STEP 1 and treat AGAIN with another 15 grams of carbohydrates.

## 2024-06-25 ENCOUNTER — Ambulatory Visit: Payer: Self-pay | Admitting: Internal Medicine

## 2024-06-25 LAB — MICROALBUMIN / CREATININE URINE RATIO
Creatinine, Urine: 175 mg/dL (ref 20–275)
Microalb Creat Ratio: 50 mg/g{creat} — ABNORMAL HIGH (ref <30)
Microalb, Ur: 8.8 mg/dL

## 2024-07-15 DIAGNOSIS — E559 Vitamin D deficiency, unspecified: Secondary | ICD-10-CM | POA: Diagnosis not present

## 2024-07-15 DIAGNOSIS — N1831 Chronic kidney disease, stage 3a: Secondary | ICD-10-CM | POA: Diagnosis not present

## 2024-07-22 DIAGNOSIS — E1122 Type 2 diabetes mellitus with diabetic chronic kidney disease: Secondary | ICD-10-CM | POA: Diagnosis not present

## 2024-07-22 DIAGNOSIS — N1832 Chronic kidney disease, stage 3b: Secondary | ICD-10-CM | POA: Diagnosis not present

## 2024-07-22 DIAGNOSIS — D631 Anemia in chronic kidney disease: Secondary | ICD-10-CM | POA: Diagnosis not present

## 2024-07-22 DIAGNOSIS — I130 Hypertensive heart and chronic kidney disease with heart failure and stage 1 through stage 4 chronic kidney disease, or unspecified chronic kidney disease: Secondary | ICD-10-CM | POA: Diagnosis not present

## 2024-07-22 DIAGNOSIS — I509 Heart failure, unspecified: Secondary | ICD-10-CM | POA: Diagnosis not present

## 2024-07-22 DIAGNOSIS — N2581 Secondary hyperparathyroidism of renal origin: Secondary | ICD-10-CM | POA: Diagnosis not present

## 2024-07-31 ENCOUNTER — Telehealth: Payer: Self-pay | Admitting: Cardiology

## 2024-07-31 ENCOUNTER — Other Ambulatory Visit (HOSPITAL_COMMUNITY): Payer: Self-pay

## 2024-07-31 ENCOUNTER — Encounter: Payer: Self-pay | Admitting: Cardiology

## 2024-07-31 ENCOUNTER — Ambulatory Visit: Attending: Cardiology | Admitting: Cardiology

## 2024-07-31 VITALS — BP 114/70 | HR 94 | Ht 61.0 in | Wt 178.8 lb

## 2024-07-31 DIAGNOSIS — I2510 Atherosclerotic heart disease of native coronary artery without angina pectoris: Secondary | ICD-10-CM | POA: Diagnosis not present

## 2024-07-31 DIAGNOSIS — I10 Essential (primary) hypertension: Secondary | ICD-10-CM | POA: Diagnosis not present

## 2024-07-31 DIAGNOSIS — E119 Type 2 diabetes mellitus without complications: Secondary | ICD-10-CM

## 2024-07-31 DIAGNOSIS — Z794 Long term (current) use of insulin: Secondary | ICD-10-CM

## 2024-07-31 DIAGNOSIS — E782 Mixed hyperlipidemia: Secondary | ICD-10-CM

## 2024-07-31 DIAGNOSIS — I739 Peripheral vascular disease, unspecified: Secondary | ICD-10-CM | POA: Diagnosis not present

## 2024-07-31 MED ORDER — SPIRONOLACTONE 25 MG PO TABS
25.0000 mg | ORAL_TABLET | Freq: Every day | ORAL | 3 refills | Status: AC
  Filled 2024-07-31 – 2024-08-08 (×3): qty 90, 90d supply, fill #0
  Filled 2024-11-01: qty 90, 90d supply, fill #1
  Filled ????-??-??: fill #0

## 2024-07-31 MED ORDER — EZETIMIBE 10 MG PO TABS
10.0000 mg | ORAL_TABLET | Freq: Every day | ORAL | 3 refills | Status: AC
  Filled 2024-07-31 – 2024-09-27 (×5): qty 90, 90d supply, fill #0
  Filled 2024-12-24: qty 90, 90d supply, fill #1

## 2024-07-31 MED ORDER — FUROSEMIDE 20 MG PO TABS
20.0000 mg | ORAL_TABLET | Freq: Every day | ORAL | 3 refills | Status: AC | PRN
  Filled 2024-07-31 (×2): qty 60, 60d supply, fill #0

## 2024-07-31 MED ORDER — CILOSTAZOL 100 MG PO TABS
100.0000 mg | ORAL_TABLET | Freq: Two times a day (BID) | ORAL | 2 refills | Status: DC
  Filled 2024-07-31 – 2024-09-05 (×3): qty 180, 90d supply, fill #0

## 2024-07-31 MED ORDER — ASPIRIN 81 MG PO TBEC: 81.0000 mg | DELAYED_RELEASE_TABLET | Freq: Every day | ORAL | 3 refills | Status: AC

## 2024-07-31 MED ORDER — REPATHA SURECLICK 140 MG/ML ~~LOC~~ SOAJ
140.0000 mg | SUBCUTANEOUS | 0 refills | Status: AC
  Filled 2024-07-31 – 2024-08-08 (×3): qty 6, 84d supply, fill #0
  Filled ????-??-??: fill #0

## 2024-07-31 MED ORDER — CARVEDILOL 3.125 MG PO TABS
3.1250 mg | ORAL_TABLET | Freq: Two times a day (BID) | ORAL | 3 refills | Status: AC
  Filled 2024-07-31 (×2): qty 180, 90d supply, fill #0
  Filled 2024-11-01: qty 180, 90d supply, fill #1

## 2024-07-31 MED ORDER — ROSUVASTATIN CALCIUM 40 MG PO TABS
40.0000 mg | ORAL_TABLET | Freq: Every day | ORAL | 3 refills | Status: AC
  Filled 2024-07-31 – 2024-09-27 (×5): qty 90, 90d supply, fill #0
  Filled 2024-12-24: qty 90, 90d supply, fill #1

## 2024-07-31 NOTE — Telephone Encounter (Signed)
 Carvedilol  reordered at 3.125 mg BID.

## 2024-07-31 NOTE — Progress Notes (Signed)
 Cardiology Office Note:  .   Date:  07/31/2024  ID:  [place], DOB February 21, 1961, MRN [id number] PCP: Teresa Bare, MD  [facility] Providers Cardiologist:  Teresa Lawrence, MD PCP: Teresa Bare, MD  Chief Complaint  Patient presents with   Hypertension   Hyperlipidemia   Coronary Artery Disease   PAD      History of Present Illness: .    [name] is a 63 y.o. female with hypertension, type 2 diabetes mellitus, hyperlipidemia, coronary artery disease, PAD, nonischemic cardiomyopathy with now recovered EF, pulmonary hypertension   Patient is doing well, denies any complaints.  For the patient is well-controlled.  She is compliant with her medical therapy.  Reviewed recent lab results with the patient, details below.  Blood pressure is well-controlled.  Vitals:   07/31/24 0757  BP: 114/70  Pulse: 94  SpO2: 94%      ROS:  Review of Systems  Cardiovascular:  Positive for claudication. Negative for chest pain, dyspnea on exertion, leg swelling, palpitations and syncope.     Studies Reviewed: [name]        EKG 07/31/2024: Normal sinus rhythm Left axis deviation When compared with ECG of 20-Nov-2020 18:51, Vent. rate has increased BY  36 BPM Nonspecific T wave abnormality has replaced inverted T waves in Inferior leads Nonspecific T wave abnormality no longer evident in Anterolateral leads QT has lengthened   Echocardiogram 2021:  Normal LV systolic function with visual EF 55-60%. Left ventricle cavity  is normal in size. Moderate left ventricular hypertrophy. Normal global  wall motion. No obvious regional wall motion abnormalities. Doppler  evidence of grade I diastolic dysfunction, elevated LAP. Calculated EF  55%.  No significant valvular abnormalities.  IVC is normal with a respiratory response of <50%.  No significant change compared to prior study dated 11/2015 except Grade I  diastolic dysfunction is new.   LHC/RHC  06/2013: LM: No significant disease LAD: 50% mid LAD stenosis with distal tapering.  LCx: Moderate early OM1 with 50% ostial stenosis. Mid LCx with 40% stenosis.  RCA: Diffuse RCA disease with 70-80% ostial stenosis and damping of the waveform; serial 50% stenoses in the mid and distal RCA.  Reviewed with Dr. Verlin.  70-80% stenosis with ostial damping in setting of relatively small caliber RCA.  The RCA is diffusely disease.  In the absence of ACS (no chest pain), would treat medically    Labs 06/2024: HbA1C 6.9% Cr 1.5, eGFR 39, Gl 129  01/2024: Chol 129, TG 159, HDL 43, LDL 59  07/2023: HbA1C 6.5% Hb 11.1 Cr 1.5   Physical Exam:   Physical Exam Cardiovascular:     Pulses:          Dorsalis pedis pulses are 1+ on the right side and 1+ on the left side.       Posterior tibial pulses are 0 on the right side and 1+ on the left side.      VISIT DIAGNOSES:   ICD-10-CM   1. PAD (peripheral artery disease) (HCC)  I73.9     2. Mixed hyperlipidemia  E78.2     3. Type 2 diabetes mellitus without complication, with long-term current use of insulin  (HCC)  E11.9    Z79.4     4. Primary hypertension  I10         ASSESSMENT AND PLAN: .    [name] is a 63 y.o. female with hypertension, type 2 diabetes mellitus, hyperlipidemia, coronary artery disease, PAD, CKD IIII-IV,  nonischemic cardiomyopathy with now recovered EF, pulmonary hypertension   Hypertension: Well-controlled. Unsure what dose of carvedilol  she really takes.  Will not refill today until patient calls us  back with more information.   PAD: No critical limb ischemia.  Abnormal ABI Rt 0.68, Lt 0.78 (01/2020). Claudication well-controlled with medical therapy and regular walking.   Continue Aspirin  81 mg daily, Pletal  100 mg bid (refilled today). Continue lisinopril  10 mg daily (will be refilled by PCP). Continue statin, Zetia , Repatha  (refilled today).   Nonischemic cardiomyopathy: Recovered LVEF, EF  55 to 60% in 2021. Continue spironolactone , refill today. She also has Lasix  for as needed use, refilled today Patient also on lisinopril , will be refilled by PCP. Unsure what dose of carvedilol  she really takes.  Will not refill today until patient calls us  back with more information.   Mixed hyperlipidemia: LDL 59 on Repatha  and Crestor  (01/2024). Continue the same.    Type 2 DM: A1c down to 6.9%.  Continue follow-up with PCP.   Coronary artery disease: No angina symptoms at this time.  Continue medical therapy.     Meds ordered this encounter  Medications   aspirin  EC 81 MG tablet    Sig: Take 1 tablet (81 mg total) by mouth daily. Swallow whole.    Dispense:  90 tablet    Refill:  3   cilostazol  (PLETAL ) 100 MG tablet    Sig: Take 1 tablet (100 mg total) by mouth 2 (two) times daily.    Dispense:  180 tablet    Refill:  2   Evolocumab  (REPATHA  SURECLICK) 140 MG/ML SOAJ    Sig: INJECT 1 ML (140 MG) INTO THE SKIN ONCE EVERY 14 DAYS    Dispense:  6 mL    Refill:  0   ezetimibe  (ZETIA ) 10 MG tablet    Sig: Take 1 tablet (10 mg total) by mouth daily.    Dispense:  90 tablet    Refill:  3   furosemide  (LASIX ) 20 MG tablet    Sig: Take 1 tablet (20 mg total) by mouth daily as needed.    Dispense:  60 tablet    Refill:  3   rosuvastatin  (CRESTOR ) 40 MG tablet    Sig: Take 1 tablet (40 mg total) by mouth at bedtime.    Dispense:  90 tablet    Refill:  3   spironolactone  (ALDACTONE ) 25 MG tablet    Sig: Take 1 tablet (25 mg total) by mouth daily.    Dispense:  90 tablet    Refill:  3     F/u in 1 year  Signed, Teresa JINNY Lawrence, MD

## 2024-07-31 NOTE — Telephone Encounter (Signed)
 FYI - Pt called in to infom Dr. Elmira that she is currently taking Carvedilol  3.125mg  2x a day.

## 2024-07-31 NOTE — Telephone Encounter (Signed)
 NotedSABRA Frieze, please refill the same.  Thanks MJP

## 2024-07-31 NOTE — Telephone Encounter (Signed)
 Message sent to Dr.Patwardhan.

## 2024-07-31 NOTE — Patient Instructions (Signed)
 Medication Instructions:  Your physician recommends that you continue on your current medications as directed. Please refer to the Current Medication list given to you today.  *If you need a refill on your cardiac medications before your next appointment, please call your pharmacy*  Lab Work: None.  If you have labs (blood work) drawn today and your tests are completely normal, you will receive your results only by: MyChart Message (if you have MyChart) OR A paper copy in the mail If you have any lab test that is abnormal or we need to change your treatment, we will call you to review the results.  Testing/Procedures: None.  Follow-Up: At [facility], you and your health needs are our priority.  As part of our continuing mission to provide you with exceptional heart care, our providers are all part of one team.  This team includes your primary Cardiologist (physician) and Advanced Practice Providers or APPs (Physician Assistants and Nurse Practitioners) who all work together to provide you with the care you need, when you need it.  Your next appointment:   1 year(s)  Provider:   Newman JINNY Lawrence, MD    Other Instructions Please check your dose of carvedilol  at home and call our office to update us  so we can get your refills ordered. Check to see strength of pills and then check instructions on bottle to see if they say to cut pills in order to get a different strength.

## 2024-08-06 ENCOUNTER — Telehealth: Payer: Self-pay

## 2024-08-06 DIAGNOSIS — E7800 Pure hypercholesterolemia, unspecified: Secondary | ICD-10-CM | POA: Diagnosis not present

## 2024-08-06 DIAGNOSIS — J4520 Mild intermittent asthma, uncomplicated: Secondary | ICD-10-CM | POA: Diagnosis not present

## 2024-08-06 DIAGNOSIS — I110 Hypertensive heart disease with heart failure: Secondary | ICD-10-CM | POA: Diagnosis not present

## 2024-08-06 DIAGNOSIS — N1831 Chronic kidney disease, stage 3a: Secondary | ICD-10-CM | POA: Diagnosis not present

## 2024-08-06 DIAGNOSIS — E6609 Other obesity due to excess calories: Secondary | ICD-10-CM | POA: Diagnosis not present

## 2024-08-06 DIAGNOSIS — E1122 Type 2 diabetes mellitus with diabetic chronic kidney disease: Secondary | ICD-10-CM | POA: Diagnosis not present

## 2024-08-06 DIAGNOSIS — I5032 Chronic diastolic (congestive) heart failure: Secondary | ICD-10-CM | POA: Diagnosis not present

## 2024-08-06 DIAGNOSIS — Z794 Long term (current) use of insulin: Secondary | ICD-10-CM | POA: Diagnosis not present

## 2024-08-06 NOTE — Progress Notes (Signed)
   08/06/2024  Patient ID: [facility], female   DOB: Jul 30, 1961, 63 y.o.   MRN: [id number]  Contacted patient regarding referral for diabetes and medication management from Catalina Bare, MD .   Appointment scheduled for 08/20/24 at 10:00am.  Heather Factor, PharmD Clinical Pharmacist  [phone]

## 2024-08-08 ENCOUNTER — Other Ambulatory Visit (HOSPITAL_COMMUNITY): Payer: Self-pay

## 2024-08-08 ENCOUNTER — Other Ambulatory Visit: Payer: Self-pay

## 2024-08-20 ENCOUNTER — Telehealth: Payer: Self-pay

## 2024-08-20 NOTE — Progress Notes (Incomplete)
 08/20/2024 Name: [name] MRN: [id number] DOB: 11-04-1961  Chief Complaint  Patient presents with   Diabetes   Medication Management    [name] is a 62 y.o. year old female who presented for a telephone visit.   They were referred to the pharmacist by their PCP for assistance in managing diabetes and medication access.    Subjective: Patient complains of excessive nausea on Ozempic  2 mg weekly. Otherwise, reports blood glucose readings have been good. No issues with medications overall at the moment.   Care Team: Primary Care Provider: Catalina Bare, MD ; Next Scheduled Visit: 09/10/24 {careteamprovider:[id number]}  Medication Access/Adherence  Current Pharmacy:  [facility] [address] [place] - [address] [place] [address] Phone: [phone] Fax: [phone]   Patient reports affordability concerns with their medications: No Patient reports all medications are affordable, has a zero copay for majority of medications.  Patient reports access/transportation concerns to their pharmacy: No  Patient reports adherence concerns with their medications:  No  According to fill history, patient up to date on refills on all meds excluding Ozempic  and Tresiba    Diabetes:  Current medications: Tresiba  12 units daily, Ozempic  2 mg weekly, Glipizide  5 mg daily Medications tried in the past: Metformin , Jardiance     Patient has Accu-chek Aviva glucometer, checking blood glucose twice daily   Current glucose readings: 110-180   Patient denies hypoglycemic s/sx including dizziness, shakiness, sweating. Patient denies hyperglycemic symptoms including polyuria, polydipsia, polyphagia, nocturia..  Current meal patterns:  - Breakfast: toast, 1 egg, 2 bacon slices - Lunch: sandwich (white bread, turkey/bologna), plain lay's chips, water  - Supper: baked chicken, corn, green beans, rice/mashed potatoes - white starches  sparingly - Snacks: fruit (orange/grapes), chips - small individual Serving with lunch - Drinks: water  (~50 oz daily), sodas - sparingly (24 oz per wk), orange juice  (8 oz weekly)  Current physical activity: 30 min aerobic exercise daily in am, 30 min brisk walk daily in pm  Current medication access support: N/A  Macrovascular and Microvascular Risk Reduction:  Statin? yes (Rosuvastatin  40 mg daily); ACEi/ARB? yes (Lisinopril  10mg ) Last urinary albumin /creatinine ratio:  Lab Results  Component Value Date   MICRALBCREAT 50 (H) 06/24/2024   Last eye exam:  03/06/2024 Last foot exam: 02/19/2024 Tobacco Use:  Tobacco Use: Low Risk  (07/31/2024)   Patient History    Smoking Tobacco Use: Never    Smokeless Tobacco Use: Never    Passive Exposure: Not on file   Medication Management:  Current adherence strategy: ***  Patient reports Good adherence to medications  Patient reports the following barriers to adherence: ***  Recent fill dates:    Objective:  Lab Results  Component Value Date   HGBA1C 6.9 (A) 06/24/2024    Lab Results  Component Value Date   CREATININE 1.50 (H) 06/24/2024   BUN 15 06/24/2024   NA 141 06/24/2024   K 4.2 06/24/2024   CL 105 06/24/2024   CO2 26 06/24/2024    Lab Results  Component Value Date   CHOL 129 01/31/2024   HDL 43 01/31/2024   LDLCALC 59 01/31/2024   TRIG 159 (H) 01/31/2024   CHOLHDL 3.0 01/31/2024    Medications Reviewed Today     Reviewed by Graylon Keen, [facility] (Pharmacist) on 08/20/24 at 1023  Med List Status: <None>   Medication Order Taking? Sig Documenting Provider Last Dose Status Informant  ACCU-CHEK AVIVA PLUS test strip [id number] Yes 1 each 3 (  three) times daily. [provider]  Active   Accu-Chek Softclix Lancets lancets [id number] Yes 1 each 3 (three) times daily. [provider]  Active   acetaminophen  (TYLENOL ) 325 MG tablet [id number] Yes Take 325-650 mg by mouth every 6 (six) hours as  needed for mild pain (depends on pain). [provider]  Active Self  albuterol  (PROVENTIL  HFA;VENTOLIN  HFA) 108 (90 BASE) MCG/ACT inhaler [id number] Yes Inhale 2 puffs into the lungs every 4 (four) hours as needed for wheezing or shortness of breath. Remonia Lacks, PA-C  Active Self  aspirin  EC 81 MG tablet [id number] Yes Take 1 tablet (81 mg total) by mouth daily. Swallow whole. Elmira Newman PARAS, MD  Active   carvedilol  (COREG ) 3.125 MG tablet [id number] Yes Take 1 tablet (3.125 mg total) by mouth 2 (two) times daily. Patwardhan, Newman PARAS, MD  Active   cilostazol  (PLETAL ) 100 MG tablet [id number] Yes Take 1 tablet (100 mg total) by mouth 2 (two) times daily. Patwardhan, Newman PARAS, MD  Active   Evolocumab  (REPATHA  SURECLICK) 140 MG/ML SOAJ [id number] Yes Inject 140 mg into the skin every 14 (fourteen) days. Patwardhan, Newman PARAS, MD  Active   ezetimibe  (ZETIA ) 10 MG tablet [id number] Yes Take 1 tablet (10 mg total) by mouth daily. Patwardhan, Newman PARAS, MD  Active   ferrous sulfate  325 (65 FE) MG tablet [id number]  Take 325 mg by mouth daily with breakfast.  Patient not taking: Reported on 08/20/2024   [provider]  Active Self           Med Note DELORAS, HEATHER D   Tue Aug 20, 2024 10:17 AM) Per pt, being help by neprhology  fluorometholone (FML) 0.1 % ophthalmic suspension [id number] Yes SMARTSIG:In Eye(s) [provider]  Active   furosemide  (LASIX ) 20 MG tablet [id number] Yes Take 1 tablet (20 mg total) by mouth daily as needed. Patwardhan, Newman PARAS, MD  Active   gabapentin (NEURONTIN) 300 MG capsule [id number] Yes Take 300 mg by mouth at bedtime. [provider]  Active   glipiZIDE  (GLUCOTROL ) 5 MG tablet [id number] Yes Take 1 tablet (5 mg total) by mouth daily before breakfast. Shamleffer, Ibtehal Jaralla, MD  Active   insulin  degludec (TRESIBA  FLEXTOUCH) 100 UNIT/ML FlexTouch Pen [id number] Yes Inject 12 Units into the skin daily.  Patient taking differently: Inject  10 Units into the skin daily.   Shamleffer, Donell Cardinal, MD  Active   Insulin  Pen Needle 32G X 4 MM MISC [id number] Yes 1 Device by Does not apply route daily in the afternoon. Shamleffer, Ibtehal Jaralla, MD  Active   lisinopril  (ZESTRIL ) 10 MG tablet [id number] Yes Take 10 mg by mouth daily.  [provider]  Active Self  Potassium Chloride  ER 20 MEQ TBCR [id number] Yes Take 1 tablet by mouth daily. [provider]  Active   RESTASIS 0.05 % ophthalmic emulsion [id number] Yes 1 drop 2 (two) times daily. [provider]  Active   rosuvastatin  (CRESTOR ) 40 MG tablet [id number] Yes Take 1 tablet (40 mg total) by mouth at bedtime. Elmira Newman PARAS, MD  Active   Semaglutide , 2 MG/DOSE, 8 MG/3ML SOPN [id number] Yes Inject 2 mg as directed once a week. Shamleffer, Ibtehal Jaralla, MD  Active   spironolactone  (ALDACTONE ) 25 MG tablet [id number] Yes Take 1 tablet (25 mg total) by mouth daily. Elmira Newman PARAS, MD  Active               Assessment/Plan:   Diabetes: - Currently  controlled; goal A1c <7%. Cardiorenal risk reduction is optimized.. Blood pressure is at goal <130/80. LDL is at goal.  - Reviewed goal A1c, goal fasting, and goal 2 hour post prandial glucose. Recommended to check glucose twice daily, Reviewed dietary modifications including replacing sodas and juices with sparkling water , healthier options., Reviewed lifestyle modifications including continuing current work out regimen., and Reviewed signs and symptoms of hypoglycemia. - Recommend to continue current regimen of Tresiba  12 units daily, glipizide  5 mg daily . Due to significant nausea for Ozempic  dose increase, will recommend to reduce dose to Ozempic  1 mg. Will discuss with endocrinologist. - Discussed side effects of gastrointestinal upset/nausea; eating smaller meals, avoiding high-fat foods, and remaining upright after eating may reduce nausea. Discussed that overeating is a major trigger of  nausea with this class of medications, as often times patients will start to feel full sooner and may need to decrease portion sizes from what they were previously accustomed to.   Medication Management: - Currently strategy sufficient to maintain appropriate adherence to prescribed medication regimen - Suggested use of weekly pill box to organize medications - Created list of medication, indication, and administration time. Provided to patient - Discussed collaboration with local pharmacies for adherence packaging. Reviewed local pharmacies with adherence packaging options. Patient elects to ***    Follow Up Plan: Will follow up 9/23   ***

## 2024-08-21 ENCOUNTER — Other Ambulatory Visit: Payer: Self-pay | Admitting: Internal Medicine

## 2024-08-21 MED ORDER — SEMAGLUTIDE (1 MG/DOSE) 4 MG/3ML ~~LOC~~ SOPN: 1.0000 mg | PEN_INJECTOR | SUBCUTANEOUS | 6 refills | Status: DC

## 2024-09-05 ENCOUNTER — Other Ambulatory Visit (HOSPITAL_COMMUNITY): Payer: Self-pay

## 2024-09-05 ENCOUNTER — Telehealth: Payer: Self-pay | Admitting: Pharmacy Technician

## 2024-09-05 NOTE — Telephone Encounter (Signed)
 Pharmacy Patient Advocate Encounter   Received notification from [facility] pharmacy that prior authorization for cilostazol  is required/requested.   Insurance verification completed.   The patient is insured through [employer] .   Per test claim: PA required; PA submitted to above mentioned insurance via Latent Key/confirmation #/EOC [id number] Status is pending   Pharmacy Patient Advocate Encounter  Received notification from [employer] that Prior Authorization for cilostazol  has been APPROVED from 09/05/24 to 12/18/24   PA #/Case ID/Reference #: [id number]

## 2024-09-06 ENCOUNTER — Other Ambulatory Visit (HOSPITAL_COMMUNITY): Payer: Self-pay

## 2024-09-25 DIAGNOSIS — Z7982 Long term (current) use of aspirin: Secondary | ICD-10-CM | POA: Diagnosis not present

## 2024-09-25 DIAGNOSIS — Z794 Long term (current) use of insulin: Secondary | ICD-10-CM | POA: Diagnosis not present

## 2024-09-25 DIAGNOSIS — M1990 Unspecified osteoarthritis, unspecified site: Secondary | ICD-10-CM | POA: Diagnosis not present

## 2024-09-25 DIAGNOSIS — Z6833 Body mass index (BMI) 33.0-33.9, adult: Secondary | ICD-10-CM | POA: Diagnosis not present

## 2024-09-25 DIAGNOSIS — I2510 Atherosclerotic heart disease of native coronary artery without angina pectoris: Secondary | ICD-10-CM | POA: Diagnosis not present

## 2024-09-25 DIAGNOSIS — J45909 Unspecified asthma, uncomplicated: Secondary | ICD-10-CM | POA: Diagnosis not present

## 2024-09-25 DIAGNOSIS — E1151 Type 2 diabetes mellitus with diabetic peripheral angiopathy without gangrene: Secondary | ICD-10-CM | POA: Diagnosis not present

## 2024-09-25 DIAGNOSIS — E876 Hypokalemia: Secondary | ICD-10-CM | POA: Diagnosis not present

## 2024-09-25 DIAGNOSIS — Z7985 Long-term (current) use of injectable non-insulin antidiabetic drugs: Secondary | ICD-10-CM | POA: Diagnosis not present

## 2024-09-25 DIAGNOSIS — I10 Essential (primary) hypertension: Secondary | ICD-10-CM | POA: Diagnosis not present

## 2024-09-25 DIAGNOSIS — E1142 Type 2 diabetes mellitus with diabetic polyneuropathy: Secondary | ICD-10-CM | POA: Diagnosis not present

## 2024-09-25 DIAGNOSIS — K08409 Partial loss of teeth, unspecified cause, unspecified class: Secondary | ICD-10-CM | POA: Diagnosis not present

## 2024-09-25 DIAGNOSIS — E669 Obesity, unspecified: Secondary | ICD-10-CM | POA: Diagnosis not present

## 2024-09-25 DIAGNOSIS — E785 Hyperlipidemia, unspecified: Secondary | ICD-10-CM | POA: Diagnosis not present

## 2024-09-27 ENCOUNTER — Other Ambulatory Visit (HOSPITAL_COMMUNITY): Payer: Self-pay

## 2024-11-07 ENCOUNTER — Other Ambulatory Visit: Payer: Self-pay | Admitting: Cardiology

## 2024-11-19 DIAGNOSIS — I5032 Chronic diastolic (congestive) heart failure: Secondary | ICD-10-CM | POA: Diagnosis not present

## 2024-11-19 DIAGNOSIS — J4520 Mild intermittent asthma, uncomplicated: Secondary | ICD-10-CM | POA: Diagnosis not present

## 2024-11-19 DIAGNOSIS — N1832 Chronic kidney disease, stage 3b: Secondary | ICD-10-CM | POA: Diagnosis not present

## 2024-11-19 DIAGNOSIS — I130 Hypertensive heart and chronic kidney disease with heart failure and stage 1 through stage 4 chronic kidney disease, or unspecified chronic kidney disease: Secondary | ICD-10-CM | POA: Diagnosis not present

## 2024-11-19 DIAGNOSIS — E782 Mixed hyperlipidemia: Secondary | ICD-10-CM | POA: Diagnosis not present

## 2024-11-19 DIAGNOSIS — Z794 Long term (current) use of insulin: Secondary | ICD-10-CM | POA: Diagnosis not present

## 2024-12-03 ENCOUNTER — Telehealth: Payer: Self-pay

## 2024-12-03 NOTE — Progress Notes (Signed)
° °  12/03/2024  Patient ID: [facility], female   DOB: 1961/05/14, 63 y.o.   MRN: [id number]  Contacted patient regarding follow-up for diabetes and medication management from Catalina Bare, MD .   Appointment scheduled 12/06/24 at 9:00 am.  Heather Factor, PharmD Clinical Pharmacist  [phone]

## 2024-12-06 ENCOUNTER — Telehealth: Payer: Self-pay

## 2024-12-06 NOTE — Progress Notes (Incomplete)
 "  12/06/2024 Name: [name] MRN: [id number] DOB: 06-22-1961  Chief Complaint  Patient presents with   Diabetes   Medication Management    [name] is a 63 y.o. year old female who presented for a telephone visit.   They were referred to the pharmacist by their PCP for assistance in managing diabetes and complex medication management.    Subjective: Patient reports today with worsening renal function and diabetes. A1c has increased to 10.6%. Patient reports that she's lost a lot of loved ones over the past couple of years and she tends to deviate from her diet and have higher blood sugars around their birthdays and the anniversaries of their passing.    Care Team: Primary Care Provider: Catalina Bare, MD ; Next Scheduled Visit: 01/22/25   Medication Access/Adherence  Current Pharmacy:  [id number] [address] [place] - [address] [place] [address] Phone: [phone] Fax: [phone]   Patient reports affordability concerns with their medications: No  Patient reports access/transportation concerns to their pharmacy: No  Patient reports adherence concerns with their medications:  No     Diabetes:  Current medications: Tresiba  18 units daily, Ozempic  1 mg weekly, Glipizide  5 mg twice daily Medications tried in the past: Metformin , Farxiga , Jardiance   Current glucose readings: blood sugar ranges from 190 - 250   Patient denies hypoglycemic s/sx including dizziness, shakiness, sweating. Patient denies hyperglycemic symptoms including polyuria, polydipsia, polyphagia, nocturia, neuropathy, blurred vision.  Current meal patterns:  - Breakfast: toast, 1 egg, 2 bacon slices, orange juice (sparingly) - Lunch: 1/2 sandwich, chef's salad, water  - Supper: baked chicken, corn, green beans, rice/mashed potatoes - white starches sparingly  - Snacks: fruits/veggies (grapes, strawberries, carrots, celery), cut  back chips - Drinks: water  (~50oz daily), cut back on sodas and orange juice  Current physical activity: 30 min aerobic exercise daily in am, 30 min brisk walk daily in pm   Current medication access support: N/A  Macrovascular and Microvascular Risk Reduction:  Statin? yes (***); ACEi/ARB? yes (***) Last urinary albumin /creatinine ratio:  Lab Results  Component Value Date   MICRALBCREAT 50 (H) 06/24/2024   Last eye exam:   Last foot exam: 02/19/2024 Tobacco Use:  Tobacco Use: Low Risk (07/31/2024)   Patient History    Smoking Tobacco Use: Never    Smokeless Tobacco Use: Never    Passive Exposure: Not on file   Medication Management:  Current adherence strategy: ***  Patient reports Good adherence to medications  Patient reports the following barriers to adherence: ***  Recent fill dates:    Objective:   Lab Results  Component Value Date   CREATININE 1.50 (H) 06/24/2024   BUN 15 06/24/2024   NA 141 06/24/2024   K 4.2 06/24/2024   CL 105 06/24/2024   CO2 26 06/24/2024    Lab Results  Component Value Date   CHOL 129 01/31/2024   HDL 43 01/31/2024   LDLCALC 59 01/31/2024   TRIG 159 (H) 01/31/2024   CHOLHDL 3.0 01/31/2024    Medications Reviewed Today     Reviewed by Graylon Keen, [facility] (Pharmacist) on 12/06/24 at 1057  Med List Status: <None>   Medication Order Taking? Sig Documenting Provider Last Dose Status Informant  ACCU-CHEK AVIVA PLUS test strip [id number] Yes 1 each 3 (three) times daily. [provider]  Active   Accu-Chek Softclix Lancets lancets [id number] Yes 1 each 3 (three) times daily. [provider]  Active  acetaminophen  (TYLENOL ) 325 MG tablet [id number] Yes Take 325-650 mg by mouth every 6 (six) hours as needed for mild pain (depends on pain). [provider]  Active Self  albuterol  (PROVENTIL  HFA;VENTOLIN  HFA) 108 (90 BASE) MCG/ACT inhaler [id number] Yes Inhale 2 puffs into the lungs every 4 (four) hours as  needed for wheezing or shortness of breath. Remonia Lacks, PA-C  Active Self  aspirin  EC 81 MG tablet [id number] Yes Take 1 tablet (81 mg total) by mouth daily. Swallow whole. Patwardhan, Newman PARAS, MD  Active   carvedilol  (COREG ) 3.125 MG tablet [id number] Yes Take 1 tablet (3.125 mg total) by mouth 2 (two) times daily. Patwardhan, Newman PARAS, MD  Active   cilostazol  (PLETAL ) 100 MG tablet [id number] Yes Take 1 tablet by mouth twice daily Patwardhan, Manish J, MD  Active   Evolocumab  (REPATHA  SURECLICK) 140 MG/ML SOAJ [id number] Yes Inject 140 mg into the skin every 14 (fourteen) days. Patwardhan, Newman PARAS, MD  Active   ezetimibe  (ZETIA ) 10 MG tablet [id number] Yes Take 1 tablet (10 mg total) by mouth daily. Elmira Newman PARAS, MD  Active    Patient not taking:   Discontinued 12/06/24 1048 (Discontinued by provider)          Med Note DELORAS, HEATHER D   Tue Aug 20, 2024 10:17 AM) Per pt, being help by neprhology    Discontinued 12/06/24 1050 (Discontinued by provider)   furosemide  (LASIX ) 20 MG tablet [id number] Yes Take 1 tablet (20 mg total) by mouth daily as needed.  Patient taking differently: Take 20 mg by mouth 2 (two) times daily.   Elmira Newman PARAS, MD  Active     Discontinued 12/06/24 1053 (Patient has not taken in last 30 days)   glipiZIDE  (GLUCOTROL ) 5 MG tablet [id number] Yes Take 1 tablet (5 mg total) by mouth daily before breakfast.  Patient taking differently: Take 5 mg by mouth 2 (two) times daily before a meal.   Shamleffer, Donell Cardinal, MD  Active   insulin  degludec (TRESIBA  FLEXTOUCH) 100 UNIT/ML FlexTouch Pen [id number] Yes Inject 12 Units into the skin daily.  Patient taking differently: Inject 18 Units into the skin daily.   Shamleffer, Donell Cardinal, MD  Active   Insulin  Pen Needle 32G X 4 MM MISC [id number] Yes 1 Device by Does not apply route daily in the afternoon. Shamleffer, Ibtehal Jaralla, MD  Active   lisinopril  (ZESTRIL ) 10 MG tablet [id number] Yes Take 10 mg by  mouth daily.  [provider]  Active Self  Potassium Chloride  ER 20 MEQ TBCR [id number] Yes Take 1 tablet by mouth daily. [provider]  Active   RESTASIS 0.05 % ophthalmic emulsion [id number] Yes 1 drop 2 (two) times daily. [provider]  Active   rosuvastatin  (CRESTOR ) 40 MG tablet [id number] Yes Take 1 tablet (40 mg total) by mouth at bedtime. Elmira Newman PARAS, MD  Active   Semaglutide , 1 MG/DOSE, 4 MG/3ML SOPN [id number] Yes Inject 1 mg as directed once a week. Shamleffer, Ibtehal Jaralla, MD  Active   spironolactone  (ALDACTONE ) 25 MG tablet [id number] Yes Take 1 tablet (25 mg total) by mouth daily. Patwardhan, Newman PARAS, MD  Active               Assessment/Plan:   Diabetes: - Currently uncontrolled; goal A1c <7%. Cardiorenal risk reduction is optimized.. Blood pressure is at goal <130/80. LDL is at goal.  - Reviewed goal A1c, goal fasting, and goal 2 hour post prandial glucose. Recommended to  check glucose ***, Reviewed dietary modifications including ***., and Reviewed signs and symptoms of hypoglycemia. - Recommend to continue ***. - Discussed side effects of gastrointestinal upset/nausea; eating smaller meals, avoiding high-fat foods, and remaining upright after eating may reduce nausea. Discussed that overeating is a major trigger of nausea with this class of medications, as often times patients will start to feel full sooner and may need to decrease portion sizes from what they were previously accustomed to.   Medication Management: - Currently strategy sufficient to maintain appropriate adherence to prescribed medication regimen - Suggested use of weekly pill box to organize medications - Created list of medication, indication, and administration time. Provided to patient - Discussed collaboration with local pharmacies for adherence packaging. Reviewed local pharmacies with adherence packaging options. Patient elects to ***    Follow Up Plan:  ***  ***   "

## 2024-12-25 ENCOUNTER — Encounter: Payer: Self-pay | Admitting: Internal Medicine

## 2024-12-25 ENCOUNTER — Ambulatory Visit: Admitting: Internal Medicine

## 2024-12-25 VITALS — BP 120/80 | Ht 61.0 in | Wt 170.0 lb

## 2024-12-25 DIAGNOSIS — N1831 Chronic kidney disease, stage 3a: Secondary | ICD-10-CM

## 2024-12-25 DIAGNOSIS — Z7984 Long term (current) use of oral hypoglycemic drugs: Secondary | ICD-10-CM | POA: Diagnosis not present

## 2024-12-25 DIAGNOSIS — N184 Chronic kidney disease, stage 4 (severe): Secondary | ICD-10-CM | POA: Diagnosis not present

## 2024-12-25 DIAGNOSIS — E1165 Type 2 diabetes mellitus with hyperglycemia: Secondary | ICD-10-CM | POA: Diagnosis not present

## 2024-12-25 DIAGNOSIS — Z794 Long term (current) use of insulin: Secondary | ICD-10-CM

## 2024-12-25 DIAGNOSIS — E1142 Type 2 diabetes mellitus with diabetic polyneuropathy: Secondary | ICD-10-CM | POA: Diagnosis not present

## 2024-12-25 DIAGNOSIS — E1122 Type 2 diabetes mellitus with diabetic chronic kidney disease: Secondary | ICD-10-CM

## 2024-12-25 DIAGNOSIS — Z7985 Long-term (current) use of injectable non-insulin antidiabetic drugs: Secondary | ICD-10-CM

## 2024-12-25 DIAGNOSIS — E1159 Type 2 diabetes mellitus with other circulatory complications: Secondary | ICD-10-CM

## 2024-12-25 LAB — POCT GLYCOSYLATED HEMOGLOBIN (HGB A1C): Hemoglobin A1C: 10.8 % — AB (ref 4.0–5.6)

## 2024-12-25 MED ORDER — INSULIN PEN NEEDLE 32G X 4 MM MISC: 1.0000 | Freq: Every day | 3 refills | Status: AC

## 2024-12-25 MED ORDER — SEMAGLUTIDE (1 MG/DOSE) 4 MG/3ML ~~LOC~~ SOPN: 1.0000 mg | PEN_INJECTOR | SUBCUTANEOUS | 6 refills | Status: AC

## 2024-12-25 MED ORDER — TRESIBA FLEXTOUCH 100 UNIT/ML ~~LOC~~ SOPN: 12.0000 [IU] | PEN_INJECTOR | Freq: Every day | SUBCUTANEOUS | 3 refills | Status: AC

## 2024-12-25 MED ORDER — GLIPIZIDE 5 MG PO TABS: ORAL_TABLET | ORAL | 3 refills | Status: AC

## 2024-12-25 NOTE — Patient Instructions (Addendum)
 Change  Glipizide  5 mg ,2  tablets before first meal of the day and 1 tablet before supper  Continue  Ozempic  1 mg weekly  Continue  Tresiba  12 units daily      HOW TO TREAT LOW BLOOD SUGARS (Blood sugar LESS THAN 70 MG/DL) Please follow the RULE OF 15 for the treatment of hypoglycemia treatment (when your (blood sugars are less than 70 mg/dL)   STEP 1: Take 15 grams of carbohydrates when your blood sugar is low, which includes:  3-4 GLUCOSE TABS  OR 3-4 OZ OF JUICE OR REGULAR SODA OR ONE TUBE OF GLUCOSE GEL    STEP 2: RECHECK blood sugar in 15 MINUTES STEP 3: If your blood sugar is still low at the 15 minute recheck --> then, go back to STEP 1 and treat AGAIN with another 15 grams of carbohydrates.

## 2024-12-25 NOTE — Progress Notes (Signed)
 " Name: [facility]  MRN/ DOB: [id number], 03-20-1961   Age/ Sex: 64 y.o., female    PCP: Catalina Bare, MD   Reason for Endocrinology Evaluation: Type 2 Diabetes Mellitus     Date of Initial Endocrinology Visit: 03/07/2022    PATIENT IDENTIFIER: [name] is a 64 y.o. female with a past medical history of T2DM, CKD, CHF, HTN , CAD and dyslipidemia . The patient presented for initial endocrinology clinic visit on 03/07/2022 for consultative assistance with her diabetes management.    HPI: [name] was    Diagnosed with DM ~ 9 yrs  Hemoglobin A1c has ranged from 11.8% in 12/2020, peaking at 12.2% in 06/2021.   Follows with [facility]   On her initial visit her A1c was 11.4%, she was already on metformin , and glipizide , she declined insulin .  We started her on Farxiga    Farxiga  and Metformin  discontinued due to low GFR 11/2022  Started  basal insulin  01/2023  Started Ozempic  03/2023  Discontinued glipizide  08/2023 with an A1c of 6.5%  We started glipizide  02/2024 with an A1c of 7.9%  SUBJECTIVE:   During the last visit (06/24/2024): A1c 6.9%     Today (12/25/2024): [name] is here for follow-up on diabetes management.she checks her blood sugars occasionally. The patient has not had hypoglycemic episodes since the last clinic visit. She continues to follow-up with cardiology for mixed hyperlipidemia, PAD and CAD  She has been going through rough times and has been using food as a coping mechanism  No nausea  No constipation  No diarrhea    HOME DIABETES REGIMEN: Ozempic  2 mg weekly Glipizide  5 mg daily- she has been taking it twice daily  Tresiba  12 units daily     Statin: yes ACE-I/ARB: yes    METER DOWNLOAD SUMMARY: 12/25-12/25/2024  Average Number Tests/Day = 0.3 Overall Mean FS Glucose = 232  BG Ranges: Low = 191 High = 262   Hypoglycemic Events/30 Days: BG < 50 = 0 Episodes of symptomatic severe hypoglycemia =  0    DIABETIC COMPLICATIONS: Microvascular complications:  CKD III , neuropathy  Denies: retinopathy  Last eye exam: Completed 03/06/2024  Macrovascular complications:  CHF, CVA , PAD   PAST HISTORY: Past Medical History:  Past Medical History:  Diagnosis Date   Asthma    prn - occasional uses inhaler   Chronic systolic CHF (congestive heart failure) (HCC)    Diabetes mellitus without complication (HCC)    borderline, being monitored, no meds   Hypercholesterolemia    Hypertension    Microcytic anemia    Nonischemic cardiomyopathy (HCC)    a. 2005 Cath: nonobstructive dzs;  b. 04/2013 Echo: EF 3035%, diff HK, restrictive physiology, mildly dil LA, PASP .   SVD (spontaneous vaginal delivery)    x 3   Past Surgical History:  Past Surgical History:  Procedure Laterality Date   BILATERAL SALPINGECTOMY Bilateral 01/15/2015   Procedure: BILATERAL SALPINGECTOMY;  Surgeon: Dickie DELENA Carder, MD;  Location: [facility];  Service: Gynecology;  Laterality: Bilateral;   COLONOSCOPY     CORONARY ANGIOPLASTY  12/20/2003   LEFT AND RIGHT HEART CATHETERIZATION WITH CORONARY ANGIOGRAM N/A 07/05/2013   Procedure: LEFT AND RIGHT HEART CATHETERIZATION WITH CORONARY ANGIOGRAM;  Surgeon: Ezra GORMAN Shuck, MD;  Location: [facility] CATH LAB;  Service: Cardiovascular;  Laterality: N/A;   RIGHT HEART CATHETERIZATION N/A 06/13/2013   Procedure: RIGHT HEART CATH;  Surgeon: Maude JAYSON Emmer, MD;  Location: [facility] CATH LAB;  Service: Cardiovascular;  Laterality: N/A;   ROBOTIC ASSISTED TOTAL HYSTERECTOMY N/A 01/15/2015   Procedure: ROBOTIC ASSISTED TOTAL HYSTERECTOMY;  Surgeon: Dickie DELENA Carder, MD;  Location: [facility];  Service: Gynecology;  Laterality: N/A;   TUBAL LIGATION     WISDOM TOOTH EXTRACTION      Social History:  reports that she has never smoked. She has never used smokeless tobacco. She reports that she does not drink alcohol  and does not use drugs. Family History:  Family History  Problem  Relation Age of Onset   Hypertension Sister    Hyperlipidemia Sister    Hypertension Sister    Hyperlipidemia Sister    Heart disease Brother        History of heart transplant   Hypertension Brother    Heart disease Brother        Coronary artery disease   Hypertension Brother    Heart disease Brother    Hypertension Brother    Heart disease Brother    Hypertension Brother    Colon cancer Neg Hx    Colon polyps Neg Hx    Esophageal cancer Neg Hx    Rectal cancer Neg Hx    Stomach cancer Neg Hx      HOME MEDICATIONS: Allergies as of 12/25/2024   No Known Allergies      Medication List        Accurate as of December 25, 2024  8:37 AM. If you have any questions, ask your nurse or doctor.          Accu-Chek Aviva Plus test strip Generic drug: glucose blood 1 each 3 (three) times daily.   Accu-Chek Softclix Lancets lancets 1 each 3 (three) times daily.   acetaminophen  325 MG tablet Commonly known as: TYLENOL  Take 325-650 mg by mouth every 6 (six) hours as needed for mild pain (depends on pain).   albuterol  108 (90 Base) MCG/ACT inhaler Commonly known as: VENTOLIN  HFA Inhale 2 puffs into the lungs every 4 (four) hours as needed for wheezing or shortness of breath.   aspirin  EC 81 MG tablet Take 1 tablet (81 mg total) by mouth daily. Swallow whole.   carvedilol  3.125 MG tablet Commonly known as: COREG  Take 1 tablet (3.125 mg total) by mouth 2 (two) times daily.   cilostazol  100 MG tablet Commonly known as: PLETAL  Take 1 tablet by mouth twice daily   ezetimibe  10 MG tablet Commonly known as: ZETIA  Take 1 tablet (10 mg total) by mouth daily.   furosemide  20 MG tablet Commonly known as: LASIX  Take 1 tablet (20 mg total) by mouth daily as needed. What changed: when to take this   glipiZIDE  5 MG tablet Commonly known as: GLUCOTROL  Take 1 tablet (5 mg total) by mouth daily before breakfast. What changed: when to take this   Insulin  Pen Needle 32G X 4 MM  Misc 1 Device by Does not apply route daily in the afternoon.   lisinopril  10 MG tablet Commonly known as: ZESTRIL  Take 10 mg by mouth daily.   Potassium Chloride  ER 20 MEQ Tbcr Take 1 tablet by mouth daily.   Repatha  SureClick 140 MG/ML Soaj Generic drug: Evolocumab  Inject 140 mg into the skin every 14 (fourteen) days.   Restasis 0.05 % ophthalmic emulsion Generic drug: cycloSPORINE 1 drop 2 (two) times daily.   rosuvastatin  40 MG tablet Commonly known as: CRESTOR  Take 1 tablet (40 mg total) by mouth at bedtime.   Semaglutide  (1 MG/DOSE) 4 MG/3ML Sopn Inject 1 mg as directed  once a week.   spironolactone  25 MG tablet Commonly known as: ALDACTONE  Take 1 tablet (25 mg total) by mouth daily.   Tresiba  FlexTouch 100 UNIT/ML FlexTouch Pen Generic drug: insulin  degludec Inject 12 Units into the skin daily. What changed: how much to take         ALLERGIES: No Known Allergies       OBJECTIVE:   VITAL SIGNS: BP 120/80   Ht 5' 1 (1.549 m)   Wt 170 lb (77.1 kg)   LMP 01/01/2015   BMI 32.12 kg/m     PHYSICAL EXAM:  General: Pt appears well and is in NAD  Lungs: Clear with good BS bilat   Heart: RRR   Extremities:  Lower extremities - No pretibial edema  Neuro: MS is good with appropriate affect, pt is alert and Ox3    DM foot exam: 12/25/2024  The skin of the feet is intact without sores or ulcerations. The pedal pulses are 1+ on right and 1+ on left. The sensation is intact to a screening 5.07, 10 gram monofilament bilaterally    DATA REVIEWED:  Lab Results  Component Value Date   HGBA1C 10.8 (A) 12/25/2024   HGBA1C 6.9 (A) 06/24/2024   HGBA1C 7.9 (A) 02/19/2024     Latest Reference Range & Units 06/24/24 08:23  Sodium 135 - 146 mmol/L 141  Potassium 3.5 - 5.3 mmol/L 4.2  Chloride 98 - 110 mmol/L 105  CO2 20 - 32 mmol/L 26  Glucose 65 - 99 mg/dL 870 (H)  BUN 7 - 25 mg/dL 15  Creatinine 9.49 - 8.94 mg/dL 8.49 (H)  Calcium  8.6 - 10.4 mg/dL  9.6  BUN/Creatinine Ratio 6 - 22 (calc) 10  eGFR > OR = 60 mL/min/1.50m2 39 (L)    Latest Reference Range & Units 06/24/24 08:34  Microalb, Ur mg/dL 8.8  MICROALB/CREAT RATIO <30 mg/g creat 50 (H)  Creatinine, Urine 20 - 275 mg/dL 824      Latest Reference Range & Units 01/31/24 09:51  Total CHOL/HDL Ratio 0.0 - 4.4 ratio 3.0  Cholesterol, Total 100 - 199 mg/dL 870  HDL Cholesterol >60 mg/dL 43  Triglycerides 0 - 850 mg/dL 840 (H)  VLDL Cholesterol Cal 5 - 40 mg/dL 27  LDL Chol Calc (NIH) 0 - 99 mg/dL 59     Old records , labs and images have been reviewed.    ASSESSMENT / PLAN / RECOMMENDATIONS:   1) Type 2 Diabetes Mellitus, Poorly  Controlled , With neuropathic, CKD IV and macrovascular  complications - Most recent A1c of 10.8 %. Goal A1c < 7.0 %.     - A1c increased from 6.9% to 10.8%, this is due to dietary indiscretions - Metformin  and SGLT2 inhibitors had to be discontinued with a GFR of  15 by 11/2022 - Patient has self increase glipizide  to twice daily, I will increase glipizide  to 2 tablets before breakfast - Intolerant to higher doses of Ozempic  - No changes to insulin  dose at this time as historically she has had hypoglycemia - The patient has started implementing low-carb diet and healthy lifestyle, she continues to walk twice daily  MEDICATIONS: Change glipizide  5 mg, 2 tablets before breakfast and 1 tablet before supper Continue Ozempic  1 mg weekly  Continue Tresiba  12 units daily     EDUCATION / INSTRUCTIONS: BG monitoring instructions: Patient is instructed to check her blood sugars 1 times a day, fasting . Call [facility] if: BG persistently < 70  I  reviewed the Rule of 15 for the treatment of hypoglycemia in detail with the patient. Literature supplied.   2) Diabetic complications:  Eye: Does not have known diabetic retinopathy.  Neuro/ Feet: Does  have known diabetic peripheral neuropathy. Renal: Patient does  have known  baseline CKD. She is  on an ACEI/ARB at present.Follows with [facility]   3) CAD/PVD/Dyslipidemia :   - Per cardiology     4) Microalbuminuria :  - MA/CR ratio elevated - Patient on lisinopril  - Will continue to monitor  Follow-up in 3 months    Signed electronically by: Stefano Redgie Butts, MD  [facility] Endocrinology  [facility] [address] [place] [place], [place] [address] Phone: [phone] FAX: [phone]   CC: Catalina Bare, MD [address] [place] [address] Phone: [phone]  Fax: [phone]    Return to Endocrinology clinic as below: No future appointments.    "

## 2025-01-14 ENCOUNTER — Other Ambulatory Visit (HOSPITAL_COMMUNITY): Payer: Self-pay

## 2025-04-01 ENCOUNTER — Ambulatory Visit: Admitting: Internal Medicine
